# Patient Record
Sex: Male | Born: 1985 | Race: White | Hispanic: No | Smoking: Current every day smoker
Health system: Southern US, Community
[De-identification: ages and names within clinical notes are randomized; demographics above are authoritative.]

## PROBLEM LIST (undated history)

## (undated) DIAGNOSIS — R569 Unspecified convulsions: Secondary | ICD-10-CM

## (undated) DIAGNOSIS — F17200 Nicotine dependence, unspecified, uncomplicated: Secondary | ICD-10-CM

## (undated) DIAGNOSIS — F319 Bipolar disorder, unspecified: Secondary | ICD-10-CM

## (undated) DIAGNOSIS — F419 Anxiety disorder, unspecified: Secondary | ICD-10-CM

## (undated) DIAGNOSIS — J45909 Unspecified asthma, uncomplicated: Secondary | ICD-10-CM

## (undated) HISTORY — DX: Unspecified convulsions: R56.9

## (undated) HISTORY — PX: WISDOM TOOTH EXTRACTION: SHX21

## (undated) HISTORY — DX: Bipolar disorder, unspecified: F31.9

## (undated) HISTORY — DX: Nicotine dependence, unspecified, uncomplicated: F17.200

## (undated) HISTORY — DX: Anxiety disorder, unspecified: F41.9

---

## 2004-07-11 ENCOUNTER — Other Ambulatory Visit: Payer: Self-pay

## 2004-07-23 ENCOUNTER — Other Ambulatory Visit: Payer: Self-pay

## 2004-12-22 ENCOUNTER — Ambulatory Visit: Payer: Self-pay | Admitting: Neurology

## 2007-08-22 ENCOUNTER — Emergency Department: Payer: Self-pay | Admitting: Emergency Medicine

## 2010-02-18 ENCOUNTER — Emergency Department: Payer: Self-pay | Admitting: Emergency Medicine

## 2010-02-28 ENCOUNTER — Emergency Department: Payer: Self-pay | Admitting: Emergency Medicine

## 2011-02-08 ENCOUNTER — Ambulatory Visit (INDEPENDENT_AMBULATORY_CARE_PROVIDER_SITE_OTHER): Payer: Self-pay | Admitting: Family Medicine

## 2011-02-08 ENCOUNTER — Encounter: Payer: Self-pay | Admitting: Family Medicine

## 2011-02-08 DIAGNOSIS — F1010 Alcohol abuse, uncomplicated: Secondary | ICD-10-CM | POA: Insufficient documentation

## 2011-02-08 DIAGNOSIS — J019 Acute sinusitis, unspecified: Secondary | ICD-10-CM

## 2011-02-08 DIAGNOSIS — J029 Acute pharyngitis, unspecified: Secondary | ICD-10-CM

## 2011-02-08 DIAGNOSIS — J309 Allergic rhinitis, unspecified: Secondary | ICD-10-CM

## 2011-02-08 DIAGNOSIS — Z720 Tobacco use: Secondary | ICD-10-CM

## 2011-02-08 DIAGNOSIS — Z8659 Personal history of other mental and behavioral disorders: Secondary | ICD-10-CM | POA: Insufficient documentation

## 2011-02-08 DIAGNOSIS — F17200 Nicotine dependence, unspecified, uncomplicated: Secondary | ICD-10-CM

## 2011-02-08 DIAGNOSIS — F1011 Alcohol abuse, in remission: Secondary | ICD-10-CM | POA: Insufficient documentation

## 2011-02-08 HISTORY — DX: Nicotine dependence, unspecified, uncomplicated: F17.200

## 2011-02-15 NOTE — Assessment & Plan Note (Signed)
Summary: SORE THROAT/CONGESTION/EVM   Vital Signs:  Patient Profile:   25 Years Old Male CC:      sore throat Height:     77.5 inches Weight:      230 pounds O2 Sat:      98 % O2 treatment:    Room Air Temp:     98.4 degrees F oral Pulse rate:   61 / minute Pulse rhythm:   regular Resp:     12 per minute BP sitting:   130 / 78  (right arm)  Pt. in pain?   yes    Location:   throat                   Current Allergies (reviewed today): No known allergies History of Present Illness History from: patient Chief Complaint: sore throat History of Present Illness: The patient presented today for evaluation and treatment of a severe sore throat and nasal congestion and postnasal drainage for 3 weeks.  He is having maxillary facial pain and temporal/frontal headache pain.  He is having some runny nose and yellow thick sputum and nasal drainage and poor taste in the mouth.  He is a longtime smoker and reports no current fever or chills but has had them earlier in the course of his 3 week illness.  He is smoking 1 ppd and has been a smoker for 8 years.    REVIEW OF SYSTEMS Constitutional Symptoms       Complains of fever.     Denies chills, night sweats, weight loss, weight gain, and fatigue.  Eyes       Denies change in vision, eye pain, eye discharge, glasses, contact lenses, and eye surgery. Ear/Nose/Throat/Mouth       Complains of frequent runny nose, sinus problems, and sore throat.      Denies hearing loss/aids, change in hearing, ear pain, ear discharge, dizziness, frequent nose bleeds, hoarseness, and tooth pain or bleeding.  Respiratory       Complains of dry cough, productive cough, and shortness of breath.      Denies wheezing, asthma, bronchitis, and emphysema/COPD.  Cardiovascular       Denies murmurs, chest pain, and tires easily with exhertion.    Gastrointestinal       Denies stomach pain, nausea/vomiting, diarrhea, constipation, blood in bowel movements, and  indigestion. Genitourniary       Denies painful urination, kidney stones, and loss of urinary control. Neurological       Complains of headaches.      Denies paralysis, seizures, and fainting/blackouts. Musculoskeletal       Denies muscle pain, joint pain, joint stiffness, decreased range of motion, redness, swelling, muscle weakness, and gout.  Skin       Denies bruising, unusual mles/lumps or sores, and hair/skin or nail changes.  Psych       Denies mood changes, temper/anger issues, anxiety/stress, speech problems, depression, and sleep problems.  Past History:  Past Medical History: Schizophrenia  Past Surgical History: Denies surgical history  Family History: Healthy per patient  Social History: Patient smokes 1 ppd cigs x 8+ years;  Drinks 24 alcoholic drinks per week; denies recreational drug useage.  Physical Exam General appearance: well developed, well nourished, no acute distress Head: normocephalic, atraumatic Eyes: conjunctivae and lids normal Pupils: equal, round, reactive to light Ears: normal, no lesions or deformities Nasal: thick yellowish drainage, red, thick nasal turbinates bilateral Oral/Pharynx: pharyngeal erythema without exudate, uvula midline without deviation, bilateral beefy  red swollen tonsils Neck: neck supple,  trachea midline, no masses Chest/Lungs: no rales, wheezes, or rhonchi bilateral, breath sounds equal without effort Heart: regular rate and  rhythm, no murmur Abdomen: soft, non-tender without obvious organomegaly Extremities: normal extremities Neurological: grossly intact and non-focal Skin: no obvious rashes or lesions MSE: oriented to time, place, and person Assessment New Problems: SCHIZOPHRENIA, HX OF (ICD-V11.0) ALCOHOL USE (ICD-305.00) CIGARETTE SMOKER (ICD-305.1) ALLERGIC RHINITIS CAUSE UNSPECIFIED (ICD-477.9) ACUTE SINUSITIS, UNSPECIFIED (ICD-461.9)   Patient Education: Patient and/or caregiver instructed in the  following: rest, fluids, Tylenol prn, Ibuprofen prn. The risks, benefits and possible side effects were clearly explained and discussed with the patient.  The patient verbalized clear understanding.  The patient was given instructions to return if symptoms don't improve, worsen or new changes develop.  If it is not during clinic hours and the patient cannot get back to this clinic then the patient was told to seek medical care at an available urgent care or emergency department.  The patient verbalized understanding.   Demonstrates willingness to comply.  Plan New Medications/Changes: IBUPROFEN 800 MG TABS (IBUPROFEN) take 1 by mouth every 8 hours with food as needed severe sore throat  #15 x 0, 02/08/2011, Clanford Johnson MD CETIRIZINE HCL 10 MG TABS (CETIRIZINE HCL) take 1 by mouth daily for allergies  #30 x 0, 02/08/2011, Clanford Johnson MD FLUTICASONE PROPIONATE 50 MCG/ACT SUSP (FLUTICASONE PROPIONATE) 2 spray per nostril once daily  #1 x 1, 02/08/2011, Clanford Johnson MD AMOXICILLIN 500 MG CAPS (AMOXICILLIN) take 2 by mouth every 8 hours x 10 days  #60 x 0, 02/08/2011, Clanford Johnson MD  Follow Up: Follow up in 2-3 days if no improvement, Follow up on an as needed basis, Follow up with Primary Physician  The patient and/or caregiver has been counseled thoroughly with regard to medications prescribed including dosage, schedule, interactions, rationale for use, and possible side effects and they verbalize understanding.  Diagnoses and expected course of recovery discussed and will return if not improved as expected or if the condition worsens. Patient and/or caregiver verbalized understanding.  Prescriptions: IBUPROFEN 800 MG TABS (IBUPROFEN) take 1 by mouth every 8 hours with food as needed severe sore throat  #15 x 0   Entered and Authorized by:   Standley Dakins MD   Signed by:   Standley Dakins MD on 02/08/2011   Method used:   Handwritten   RxID:   [id number] CETIRIZINE HCL  10 MG TABS (CETIRIZINE HCL) take 1 by mouth daily for allergies  #30 x 0   Entered and Authorized by:   Standley Dakins MD   Signed by:   Standley Dakins MD on 02/08/2011   Method used:   Handwritten   RxID:   [id number] FLUTICASONE PROPIONATE 50 MCG/ACT SUSP (FLUTICASONE PROPIONATE) 2 spray per nostril once daily  #1 x 1   Entered and Authorized by:   Standley Dakins MD   Signed by:   Standley Dakins MD on 02/08/2011   Method used:   Handwritten   RxID:   [id number] AMOXICILLIN 500 MG CAPS (AMOXICILLIN) take 2 by mouth every 8 hours x 10 days  #60 x 0   Entered and Authorized by:   Standley Dakins MD   Signed by:   Standley Dakins MD on 02/08/2011   Method used:   Handwritten   RxID:   [id number]   Patient Instructions: 1)  Go to the pharmacy and pick up your prescription (s).  It may take up to 30 mins  for electronic prescriptions to be delivered to the pharmacy.  Please call if your pharmacy has not received your prescriptions after 30 minutes.   2)  Return or go to the ER if no improvement or symptoms getting worse.   3)  Take your antibiotic as prescribed until ALL of it is gone, but stop if you develop a rash or swelling and contact our office as soon as possible. 4)  Acute sinusitis symptoms for less than 10 days are not helped by antibiotics.Use warm moist compresses, and over the counter decongestants ( only as directed). Call if no improvement in 5-7 days, sooner if increasing pain, fever, or new symptoms. 5)  The patient was counseled and advised to stop using all tobacco products.  Medical assistance was offered and the patient was encouraged to call 1-800-QUIT-NOW to get a smoking cessation coach.    6)  Tobacco is very bad for your health and your loved ones! You Should stop smoking!. 7)  Stop Smoking Tips: Choose a Quit date. Cut down before the Quit date. decide what you will do as a substitute when you feel the urge to  smoke(gum,toothpick,exercise). 8)  The patient was informed that there is no on-call provider or services available at this clinic during off-hours (when the clinic is closed).  If the patient developed a problem or concern that required immediate attention, the patient was advised to go the the nearest available urgent care or emergency department for medical care.  The patient verbalized understanding.

## 2011-10-04 ENCOUNTER — Inpatient Hospital Stay: Payer: Self-pay | Admitting: Psychiatry

## 2011-11-19 ENCOUNTER — Inpatient Hospital Stay: Payer: Self-pay | Admitting: Psychiatry

## 2011-11-20 DIAGNOSIS — R9431 Abnormal electrocardiogram [ECG] [EKG]: Secondary | ICD-10-CM

## 2012-03-04 ENCOUNTER — Emergency Department: Payer: Self-pay | Admitting: Emergency Medicine

## 2014-09-14 DIAGNOSIS — F17219 Nicotine dependence, cigarettes, with unspecified nicotine-induced disorders: Secondary | ICD-10-CM | POA: Insufficient documentation

## 2015-08-09 DIAGNOSIS — E559 Vitamin D deficiency, unspecified: Secondary | ICD-10-CM | POA: Insufficient documentation

## 2015-08-09 DIAGNOSIS — J45902 Unspecified asthma with status asthmaticus: Secondary | ICD-10-CM | POA: Insufficient documentation

## 2017-04-23 ENCOUNTER — Ambulatory Visit: Payer: Self-pay | Admitting: Nurse Practitioner

## 2017-05-09 ENCOUNTER — Ambulatory Visit: Payer: Self-pay | Admitting: Family Medicine

## 2017-10-18 DIAGNOSIS — Z79899 Other long term (current) drug therapy: Secondary | ICD-10-CM | POA: Diagnosis not present

## 2018-02-04 ENCOUNTER — Encounter: Payer: Self-pay | Admitting: Family Medicine

## 2018-02-04 ENCOUNTER — Ambulatory Visit (INDEPENDENT_AMBULATORY_CARE_PROVIDER_SITE_OTHER): Payer: Medicare Other | Admitting: Family Medicine

## 2018-02-04 VITALS — BP 121/75 | HR 61 | Temp 98.3°F | Resp 16 | Ht 77.5 in | Wt 207.2 lb

## 2018-02-04 DIAGNOSIS — F200 Paranoid schizophrenia: Secondary | ICD-10-CM

## 2018-02-04 DIAGNOSIS — Z7689 Persons encountering health services in other specified circumstances: Secondary | ICD-10-CM | POA: Diagnosis not present

## 2018-02-04 DIAGNOSIS — R21 Rash and other nonspecific skin eruption: Secondary | ICD-10-CM | POA: Diagnosis not present

## 2018-02-04 NOTE — Patient Instructions (Addendum)
Thank you for coming to the office today.  1. May try OTC cortisone cream for anti itch and topical steroid to calm down this area as needed - usually recommend twice daily for 1-2 weeks.  If develop rash such as shingles - blistering, red raised, splotchy rash in this distinct area, then notify office, can send picture via mychart, and would recommend Valtrex anti viral medicine.   DUE for FASTING BLOOD WORK (no food or drink after midnight before the lab appointment, only water or coffee without cream/sugar on the morning of)  SCHEDULE "Lab Only" visit in the morning at the clinic for lab draw in 6 WEEKS   - Make sure Lab Only appointment is at about 1 week before your next appointment, so that results will be available  For Lab Results, once available within 2-3 days of blood draw, you can can log in to MyChart online to view your results and a brief explanation. Also, we can discuss results at next follow-up visit.   Please schedule a Follow-up Appointment to: Return in about 6 weeks (around 03/18/2018) for Annual Physical.    If you have any other questions or concerns, please feel free to call the office or send a message through MyChart. You may also schedule an earlier appointment if necessary.  Additionally, you may be receiving a survey about your experience at our office within a few days to 1 week by e-mail or mail. We value your feedback.  Saralyn PilarAlexander Asanti Craigo, DO [facility] [place] [facility]

## 2018-02-04 NOTE — Progress Notes (Signed)
Subjective:    Patient ID: Rodney FloorSean P Fehr, male    DOB: 1986-02-25, 32 y.o.   MRN: [id number]  Rodney [name] is a 32 y.o. male presenting on 02/04/2018 for Establish Care (rash)  [place]. [facility]. [facility] 1-2 years.  HPI   History of Mania vs Paranoid Schizophrenia in Remission [facility], Dr Rollene RotundaKaren [facility], no longer followed by Psychology/therapist, has followed q 3 month visits, and q 6 month labs Reviews back history, He reports symptoms started when he went off to college, in past he said that he experimented with some drugs, and he said that he was given a "mixed" diagnosis, thought drug induced psychosis and had extreme paranoia, lost touch with reality, he was institutionalized a few times, now >10 years since episodes. - Currently on various meds, rx per Psych cause some sedation at night - Depakote ER 500 mg 24 hours check lab every 6 months - Risperidone 2 mg  - Gabapentin 600mg  using for more calming and anxiety, taking twice daily AM and lunch  History of  Non typed Hepatitis illness - back in 4th grade, possible related to food borne or possible hepatitis vaccine 6-12 month before.  Left Abdominal Rash Recent onset 1-2 weeks ago switched from pain to itch - now 2-3 days rash He has a skin spot sore on Left abdomen - he had Psychiatry do some blood tests at that time, checked liver enzymes. Seems to scratch this area more and has a rash. - Admits itching - Admits occasional sharp pains, now mostly resolved  STD Screening Requesting STD screening with labs HIV, RPR, and GC/Chlamydia testing Has girlfriend, with HPV recently checked    [facility]  Decreased Interest 0  Down, Depressed, Hopeless 0  PHQ - 2 Score 0    History reviewed. No pertinent past medical history. History reviewed. No pertinent surgical history. Social History    Socioeconomic History  . Marital status: Single    Spouse name: Not on file  . Number of children: Not on file  . Years of education: Not on file  . Highest education level: Not on file  Social Needs  . Financial resource strain: Not on file  . Food insecurity - worry: Not on file  . Food insecurity - inability: Not on file  . Transportation needs - medical: Not on file  . Transportation needs - non-medical: Not on file  Occupational History  . Not on file  Tobacco Use  . Smoking status: Current Every Day Smoker    Packs/day: 1.00    Types: Cigarettes  . Smokeless tobacco: Current User  Substance and Sexual Activity  . Alcohol use: Yes  . Drug use: Yes    Types: Marijuana    Comment: in past  . Sexual activity: Not on file  Other Topics Concern  . Not on file  Social History Narrative  . Not on file   History reviewed. No pertinent family history. Current Outpatient Medications on File Prior to Visit  Medication Sig  . albuterol (PROAIR HFA) 108 (90 Base) MCG/ACT inhaler Inhale into the lungs.  . benztropine (COGENTIN) 0.5 MG tablet Take 0.5 mg by mouth.  . divalproex (DEPAKOTE ER) 500 MG 24 hr tablet Take 1,000 mg by mouth.  . gabapentin (NEURONTIN) 600 MG tablet Take 600 mg by mouth.  . risperiDONE (RISPERDAL) 2 MG tablet Take  2 mg by mouth.   No current facility-administered medications on file prior to visit.     Review of Systems  Constitutional: Negative for activity change, appetite change, chills, diaphoresis, fatigue and fever.  HENT: Negative for congestion and hearing loss.   Eyes: Negative for visual disturbance.  Respiratory: Negative for cough, chest tightness, shortness of breath and wheezing.   Cardiovascular: Negative for chest pain, palpitations and leg swelling.  Gastrointestinal: Negative for abdominal pain, constipation, diarrhea, nausea and vomiting.  Endocrine: Negative for cold intolerance and polyuria.  Genitourinary: Negative for  decreased urine volume, dysuria, frequency, hematuria, testicular pain and urgency.  Musculoskeletal: Negative for arthralgias and neck pain.  Skin: Positive for rash.  Neurological: Negative for dizziness, weakness, light-headedness, numbness and headaches.  Hematological: Negative for adenopathy.  Psychiatric/Behavioral: Negative for behavioral problems, dysphoric mood and sleep disturbance. The patient is not nervous/anxious.    Per HPI unless specifically indicated above     Objective:    BP 121/75   Pulse 61   Temp 98.3 F (36.8 C) (Oral)   Resp 16   Ht 6' 5.5" (1.969 m)   Wt 207 lb 3.2 oz (94 kg)   BMI 24.25 kg/m   Wt Readings from Last 3 Encounters:  02/04/18 207 lb 3.2 oz (94 kg)  02/08/11 (!) 230 lb (104.3 kg)    Physical Exam  Constitutional: He is oriented to person, place, and time. He appears well-developed and well-nourished. No distress.  Well-appearing, comfortable, cooperative  HENT:  Head: Normocephalic and atraumatic.  Mouth/Throat: Oropharynx is clear and moist.  Eyes: Conjunctivae are normal. Right eye exhibits no discharge. Left eye exhibits no discharge.  Neck: Normal range of motion. Neck supple. No thyromegaly present.  Cardiovascular: Normal rate, regular rhythm, normal heart sounds and intact distal pulses.  No murmur heard. Pulmonary/Chest: Effort normal and breath sounds normal. No respiratory distress. He has no wheezes. He has no rales.  Musculoskeletal: Normal range of motion. He exhibits no edema.  Lymphadenopathy:    He has no cervical adenopathy.  Neurological: He is alert and oriented to person, place, and time.  Skin: Skin is warm and dry. Rash (very minimal punctate erythematous spots mostly resolved Left lateral abdomen, no flaky dry skin, non tender) noted. He is not diaphoretic. No erythema.  Psychiatric: He has a normal mood and affect. His behavior is normal.  Well groomed, good eye contact, normal speech and thoughts. Very good  insight into his health.  Nursing note and vitals reviewed.  No results found for this or any previous visit.    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    Stable, chronic problem, now >10 years in remission See prior background with question of onset related to drugs/substances Followed by [facility] On current med regimen Discussion today advised I do not plan to rx his psych meds at this time, will review past history and course, and consider some in future if needed Will follow-up with labs from Psych       Other Visit Diagnoses    Encounter to establish care with new doctor   Request records from Psychiatry and will review [facility] outside records      Rash and nonspecific skin eruption     Seems resolving, non specific cause. History not quite consistent with Shingles eruption May try OTC hydrocortisone PRN      No orders of the defined types  were placed in this encounter.     Follow up plan: Return in about 6 weeks (around 03/18/2018) for Annual Physical.   Will order future labs, and STD screening, patient given urine cup for first void GC/Chlamydia to bring to clinic for sample.  Saralyn Pilar, DO [facility] 02/05/2018, 1:56 AM

## 2018-02-05 ENCOUNTER — Other Ambulatory Visit: Payer: Self-pay | Admitting: Family Medicine

## 2018-02-05 ENCOUNTER — Encounter: Payer: Self-pay | Admitting: Family Medicine

## 2018-02-05 DIAGNOSIS — F200 Paranoid schizophrenia: Secondary | ICD-10-CM | POA: Insufficient documentation

## 2018-02-05 DIAGNOSIS — Z114 Encounter for screening for human immunodeficiency virus [HIV]: Secondary | ICD-10-CM

## 2018-02-05 DIAGNOSIS — Z79899 Other long term (current) drug therapy: Secondary | ICD-10-CM

## 2018-02-05 DIAGNOSIS — Z0000 Encounter for general adult medical examination without abnormal findings: Secondary | ICD-10-CM

## 2018-02-05 DIAGNOSIS — Z113 Encounter for screening for infections with a predominantly sexual mode of transmission: Secondary | ICD-10-CM

## 2018-02-05 NOTE — Assessment & Plan Note (Signed)
Stable, chronic problem, now >10 years in remission See prior background with question of onset related to drugs/substances [facility] On current med regimen Discussion today advised I do not plan to rx his psych meds at this time, will review past history and course, and consider some in future if needed Will follow-up with labs from Psych

## 2018-03-08 ENCOUNTER — Other Ambulatory Visit: Payer: Self-pay

## 2018-03-08 DIAGNOSIS — Z79899 Other long term (current) drug therapy: Secondary | ICD-10-CM

## 2018-03-08 DIAGNOSIS — Z113 Encounter for screening for infections with a predominantly sexual mode of transmission: Secondary | ICD-10-CM

## 2018-03-08 DIAGNOSIS — Z0000 Encounter for general adult medical examination without abnormal findings: Secondary | ICD-10-CM

## 2018-03-08 DIAGNOSIS — Z114 Encounter for screening for human immunodeficiency virus [HIV]: Secondary | ICD-10-CM

## 2018-03-11 ENCOUNTER — Ambulatory Visit (INDEPENDENT_AMBULATORY_CARE_PROVIDER_SITE_OTHER): Payer: Medicare Other | Admitting: Family Medicine

## 2018-03-11 ENCOUNTER — Encounter: Payer: Self-pay | Admitting: Family Medicine

## 2018-03-11 ENCOUNTER — Other Ambulatory Visit: Payer: Self-pay | Admitting: Family Medicine

## 2018-03-11 ENCOUNTER — Other Ambulatory Visit: Payer: Medicare Other

## 2018-03-11 VITALS — BP 115/75 | HR 69 | Temp 98.1°F | Resp 16 | Ht 77.5 in | Wt 209.0 lb

## 2018-03-11 DIAGNOSIS — Z0000 Encounter for general adult medical examination without abnormal findings: Secondary | ICD-10-CM

## 2018-03-11 DIAGNOSIS — F200 Paranoid schizophrenia: Secondary | ICD-10-CM | POA: Diagnosis not present

## 2018-03-11 DIAGNOSIS — Z79899 Other long term (current) drug therapy: Secondary | ICD-10-CM | POA: Diagnosis not present

## 2018-03-11 DIAGNOSIS — Z720 Tobacco use: Secondary | ICD-10-CM | POA: Diagnosis not present

## 2018-03-11 NOTE — Patient Instructions (Addendum)
Thank you for coming to the office today.  1.  Keep the good work overall  Continue working on improving regular exercise and diet as discussed, agree with goal to reduce fast food and soda intake and increase water.  For breathing / cough - If interested in quitting smoking and need assistance, let me know and we can have you return to office to discuss  QUITLINE phone number 1 800-QUIT NOW  Also you may have an early form of COPD - affecting your breathing, cough and productive mucus - May consider pulmonary function testing at a Lung Specialist for a diagnosis in future - Or we can start a DAILY inhaler to help control your symptoms, such as Spiriva, Flovent or Breo, Dulera, Advair  DUE for FASTING BLOOD WORK (no food or drink after midnight before the lab appointment, only water or coffee without cream/sugar on the morning of)  SCHEDULE "Lab Only" visit in the morning at the clinic for lab draw in 1 YEAR - SAME DAY AS PHYSICAL  For Lab Results, once available within 2-3 days of blood draw, you can can log in to MyChart online to view your results and a brief explanation. Also, we can discuss results at next follow-up visit.   Please schedule a Follow-up Appointment to: Return in about 1 year (around 03/12/2019) for Annual Physical.  If you have any other questions or concerns, please feel free to call the office or send a message through MyChart. You may also schedule an earlier appointment if necessary.  Additionally, you may be receiving a survey about your experience at our office within a few days to 1 week by e-mail or mail. We value your feedback.  Saralyn PilarAlexander Karamalegos, DO [facility] [place]

## 2018-03-11 NOTE — Assessment & Plan Note (Signed)
Stable, chronic problem, now >10 years in remission [facility] Cheree DittoGraham, last seen 12/2017 On current med regimen per Psych

## 2018-03-11 NOTE — Assessment & Plan Note (Signed)
Active smoker, 1ppd x 15 years No prior successful quit attempt Not quite ready to quit, but has some recent worse symptoms with cough Handout provided, quitline resource given, counseling provided  Discussion today >5 min specifically on counseling on risks of tobacco use, complications, treatment, smoking cessation.

## 2018-03-11 NOTE — Progress Notes (Signed)
Subjective:    Patient ID: Rodney [name], male    DOB: 1986-02-12, 32 y.o.   MRN: [id number]  Rodney [name] is a 32 y.o. male presenting on 03/11/2018 for Annual Exam   HPI   Here for Annual Physical and fasting labs drawn today.  Annual Physical / Lifestyle - No significant updates since last visit when he established care with Rodney [name] in February 2019 - He does admit lifestyle is lacking since he has less exercise with new job, is less physical than prior job - Diet is balanced, but he does admit sometimes limited by time and convenience, eats fast food and soda, he will try to reduce these  History of Mania vs Paranoid Schizophrenia in Remission Followed by [facility]iatry, Dr Rollene Rotunda, no longer followed by Psychology/therapist, has followed q 3 month visits, and q 6 month labs Reviews back history, He reports symptoms started when he went off to college, in past he said that he experimented with some drugs, and he said that he was given a "mixed" diagnosis, thought drug induced psychosis and had extreme paranoia, lost touch with reality, he was institutionalized a few times, now >10 years since episodes. - Currently on various meds, rx per Psych cause some sedation at night - Depakote ER 500 mg 24 hours check lab every 6 months - Risperidone 2 mg  - Gabapentin 600mg  using for more calming and anxiety, taking twice daily AM and lunch - Today he reports no changes and doing well on current regimen - No new concerns - See ROS below and documentation in CareEverywhere from Psychiatry  STD Screening Requested STD screening with labs HIV, RPR, and GC/Chlamydia testing, has provided 1st void of day urine sample from home for [facility] / Chlamydia, pending result now Has girlfriend, with HPV recently checked - He is asymptomatic and so is girlfriend, but requesting labs for screening  Tobacco Abuse Active smoker, >15 years 1ppd. He does admit some occasional "smoker's cough" and some wheezing  or cough, but never treated for it. No prior dx COPD or breathing treatments or inhalers. He is interested in quitting but not ready.  Health Maintenance: Declined TDap, he thinks has not been >10 years since last vaccine - deferred to next visit  No flowsheet data found.   Depression screen PHQ 2/9 02/04/2018  Decreased Interest 0  Down, Depressed, Hopeless 0  PHQ - 2 Score 0    Past Medical History:  Diagnosis Date  . CIGARETTE SMOKER 02/08/2011   Qualifier: Diagnosis of  By: Laural Benes MD, [place]     History reviewed. No pertinent surgical history. Social History   Socioeconomic History  . Marital status: Single    Spouse name: Not on file  . Number of children: Not on file  . Years of education: Not on file  . Highest education level: Not on file  Social Needs  . Financial resource strain: Not on file  . Food insecurity - worry: Not on file  . Food insecurity - inability: Not on file  . Transportation needs - medical: Not on file  . Transportation needs - non-medical: Not on file  Occupational History  . Occupation: Museum/gallery exhibitions officer    Comment: Rodney [name]  Tobacco Use  . Smoking status: Current Every Day Smoker    Packs/day: 1.00    Years: 15.00    Pack years: 15.00    Types: Cigarettes  . Smokeless tobacco: Current User  Substance and Sexual Activity  . Alcohol use:  No    Frequency: Never    Comment: 6 drinks a day  . Drug use: No    Comment: in past  . Sexual activity: Not on file  Other Topics Concern  . Not on file  Social History Narrative  . Not on file   History reviewed. No pertinent family history. Current Outpatient Medications on File Prior to Visit  Medication Sig  . albuterol (PROAIR HFA) 108 (90 Base) MCG/ACT inhaler Inhale into the lungs.  . benztropine (COGENTIN) 0.5 MG tablet Take 0.5 mg by mouth.  . divalproex (DEPAKOTE ER) 500 MG 24 hr tablet Take 1,000 mg by mouth.  . gabapentin (NEURONTIN) 600 MG tablet Take 600 mg by mouth.  .  risperiDONE (RISPERDAL) 2 MG tablet Take 2 mg by mouth.   No current facility-administered medications on file prior to visit.     Review of Systems  Constitutional: Negative for activity change, appetite change, chills, diaphoresis, fatigue and fever.  HENT: Negative for congestion and hearing loss.   Eyes: Negative for visual disturbance.  Respiratory: Positive for cough. Negative for apnea, choking, chest tightness, shortness of breath and wheezing.   Cardiovascular: Negative for chest pain, palpitations and leg swelling.  Gastrointestinal: Negative for abdominal pain, anal bleeding, blood in stool, constipation, diarrhea, nausea and vomiting.  Endocrine: Negative for cold intolerance and polyuria.  Genitourinary: Negative for decreased urine volume, difficulty urinating, dysuria, frequency, hematuria, testicular pain and urgency.  Musculoskeletal: Negative for arthralgias, back pain and neck pain.  Skin: Negative for rash.  Allergic/Immunologic: Negative for environmental allergies.  Neurological: Negative for dizziness, weakness, light-headedness, numbness and headaches.  Hematological: Negative for adenopathy.  Psychiatric/Behavioral: Negative for agitation, behavioral problems, decreased concentration, dysphoric mood and sleep disturbance. The patient is not nervous/anxious.    Per HPI unless specifically indicated above     Objective:    BP 115/75   Pulse 69   Temp 98.1 F (36.7 C) (Oral)   Resp 16   Ht 6' 5.5" (1.969 m)   Wt 209 lb (94.8 kg)   BMI 24.47 kg/m   Wt Readings from Last 3 Encounters:  03/11/18 209 lb (94.8 kg)  02/04/18 207 lb 3.2 oz (94 kg)  02/08/11 (!) 230 lb (104.3 kg)    Physical Exam  Constitutional: He is oriented to person, place, and time. He appears well-developed and well-nourished. No distress.  Well-appearing, comfortable, cooperative  HENT:  Head: Normocephalic and atraumatic.  Mouth/Throat: Oropharynx is clear and moist.  Eyes:  Conjunctivae and EOM are normal. Pupils are equal, round, and reactive to light. Right eye exhibits no discharge. Left eye exhibits no discharge.  Neck: Normal range of motion. Neck supple. No thyromegaly present.  Cardiovascular: Normal rate, regular rhythm, normal heart sounds and intact distal pulses.  No murmur heard. Pulmonary/Chest: Effort normal. No respiratory distress. He has no rales.  Mild reduced air movement diffusely, some scattered mild wheezes at times but overall still good air movement and speaks full sentences. Minimal coughing. No focal crackles, some mild rhonchi clear with cough.  Abdominal: Soft. Bowel sounds are normal. He exhibits no distension and no mass. There is no tenderness.  Musculoskeletal: Normal range of motion. He exhibits no edema or tenderness.  Upper / Lower Extremities: - Normal muscle tone, strength bilateral upper extremities 5/5, lower extremities 5/5  Lymphadenopathy:    He has no cervical adenopathy.  Neurological: He is alert and oriented to person, place, and time.  Distal sensation intact to light touch all  extremities  Skin: Skin is warm and dry. No rash noted. He is not diaphoretic. No erythema.  Psychiatric: He has a normal mood and affect. His behavior is normal.  Well groomed, good eye contact, normal speech and thoughts  Nursing note and vitals reviewed.  No results found for this or any previous visit.    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        Other Visit Diagnoses    Annual physical exam    -  Primary Updated Health Maintenance Declined TDap and Flu Check routine labs including screening STDs with routine HIV Counseling on smoking cessation Will review basic labs fasting today Encourage improve lifestyle with goal to reduce fast food and soda intake      No orders of the defined types were placed in this encounter.   Follow up plan: Return in about 1 year (around 03/12/2019) for Annual Physical.  Future labs 1 year to be ordered after review of current labs from today.  Saralyn Pilar, DO [facility] 03/11/2018, 5:42 PM

## 2018-03-12 LAB — C. TRACHOMATIS/N. GONORRHOEAE RNA
C. trachomatis RNA, TMA: NOT DETECTED
N. GONORRHOEAE RNA, TMA: NOT DETECTED

## 2018-03-12 LAB — HEMOGLOBIN A1C
EAG (MMOL/L): 5.7 (calc)
Hgb A1c MFr Bld: 5.2 % of total Hgb (ref <5.7)
MEAN PLASMA GLUCOSE: 103 (calc)

## 2018-03-12 LAB — COMPLETE METABOLIC PANEL WITH GFR
AG Ratio: 1.6 (calc) (ref 1.0–2.5)
ALKALINE PHOSPHATASE (APISO): 52 U/L (ref 40–115)
ALT: 10 U/L (ref 9–46)
AST: 13 U/L (ref 10–40)
Albumin: 4.7 g/dL (ref 3.6–5.1)
BUN/Creatinine Ratio: 6 (calc) (ref 6–22)
BUN: 6 mg/dL — AB (ref 7–25)
CALCIUM: 9.9 mg/dL (ref 8.6–10.3)
CO2: 31 mmol/L (ref 20–32)
Chloride: 101 mmol/L (ref 98–110)
Creat: 1.02 mg/dL (ref 0.60–1.35)
GFR, EST NON AFRICAN AMERICAN: 96 mL/min/{1.73_m2} (ref >=60)
GFR, Est African American: 111 mL/min/{1.73_m2} (ref >=60)
GLUCOSE: 86 mg/dL (ref 65–99)
Globulin: 2.9 g/dL (calc) (ref 1.9–3.7)
Potassium: 4.3 mmol/L (ref 3.5–5.3)
SODIUM: 140 mmol/L (ref 135–146)
Total Bilirubin: 0.2 mg/dL (ref 0.2–1.2)
Total Protein: 7.6 g/dL (ref 6.1–8.1)

## 2018-03-12 LAB — RPR: RPR: NONREACTIVE

## 2018-03-12 LAB — HIV ANTIBODY (ROUTINE TESTING W REFLEX): HIV 1&2 Ab, 4th Generation: NONREACTIVE

## 2018-04-09 ENCOUNTER — Other Ambulatory Visit: Payer: Self-pay

## 2018-04-09 ENCOUNTER — Ambulatory Visit (INDEPENDENT_AMBULATORY_CARE_PROVIDER_SITE_OTHER): Payer: Medicare Other | Admitting: Family Medicine

## 2018-04-09 ENCOUNTER — Encounter: Payer: Self-pay | Admitting: Family Medicine

## 2018-04-09 VITALS — BP 109/63 | HR 75 | Temp 98.1°F | Resp 16 | Ht 77.0 in | Wt 202.0 lb

## 2018-04-09 DIAGNOSIS — R1013 Epigastric pain: Secondary | ICD-10-CM

## 2018-04-09 DIAGNOSIS — R112 Nausea with vomiting, unspecified: Secondary | ICD-10-CM | POA: Diagnosis not present

## 2018-04-09 MED ORDER — OMEPRAZOLE 40 MG PO CPDR: 40.0000 mg | DELAYED_RELEASE_CAPSULE | Freq: Every day | ORAL | 0 refills | Status: DC

## 2018-04-09 MED ORDER — SUCRALFATE 1 G PO TABS: 1.0000 g | ORAL_TABLET | Freq: Three times a day (TID) | ORAL | 0 refills | Status: DC

## 2018-04-09 MED ORDER — ONDANSETRON 4 MG PO TBDP: 4.0000 mg | ORAL_TABLET | Freq: Three times a day (TID) | ORAL | 0 refills | Status: DC | PRN

## 2018-04-09 NOTE — Patient Instructions (Addendum)
Thank you for coming to the office today.  I am not sure the exact cause of your abdominal pain, however I am concerned that one significant possibility could be underlying acid reflux or gastritis, it is possible but less likely to have a gastric ulcer.  I do not think this is your gallbladder or appendix today. This is reassuring.  Starting tomorrow before breakfast or tonight before dinner take Omeprazole 40mg  daily. Prefer to take this med about 30 min before breakfast or 1st meal of day for 2-4 weeks  Take other prescribed medicine Carafate (Sucralfate) as needed up to 4 times daily (3 meals and bedtime)  to coat stomach lining to ease symptoms, if it helps reduce symptoms then it is more likely to be due to acid and/or ulcer.  DIET RECOMMENDATIONS - Avoid spicy, greasy, fried foods, also things like caffeine, dark chocolate, peppermint can worsen - Avoid large meals and late night snacks, also do not go more than 4-5 hours without a snack or meal (not eating will worsen reflux symptoms due to stomach acid) - You may also elevate the head of your bed at night to sleep at very slight incline to help reduce symptoms  If the problem improves but keeps coming back, we can discuss higher dose or longer course at next visit.  If symptoms are worsening or persistent despite treatment or develop any different severe esophagus or abdominal pain, unable to swallow solids or liquids, nausea, vomiting especially blood in vomit, fever/chills, or unintentional weight loss / no appetite, please follow-up sooner in office or seek more immediate medical attention at hospital Emergency Department.  Regarding other medicines  Zofran for nausea  - STOP taking Ibuprofen, Advil, Motrin, Goody's / BC powder - DO NOT take without discussing with your doctor. These medicines can put you at high risk for future bleeding.  If need pain medicine, may take Tylenol Extra Strength (Acetaminophen) 500mg  tabs -  take 1 to 2 tabs per dose (max 1000mg ) every 6-8 hours for pain (take regularly, don't skip a dose for next 7 days), max 24 hour daily dose is 6 tablets or 3000mg . In the future you can repeat the same everyday Tylenol course for 1-2 weeks at a time.  If significant worsening despite medicine, worse pain, nausea vomiting fever or other concern, call office or go straight to hospital ED may need imaging test  Please schedule a Follow-up Appointment to: Return in about 1 month (around 05/07/2018), or if symptoms worsen or fail to improve, for abdominal pain.  If you have any other questions or concerns, please feel free to call the office or send a message through MyChart. You may also schedule an earlier appointment if necessary.  Additionally, you may be receiving a survey about your experience at our office within a few days to 1 week by e-mail or mail. We value your feedback.  Saralyn PilarAlexander Karamalegos, DO [facility] [place]

## 2018-04-09 NOTE — Progress Notes (Signed)
Subjective:    Patient ID: Rodney [name], male    DOB: 1986-11-08, 32 y.o.   MRN: [id number]  Rodney [name] is a 32 y.o. male presenting on 04/09/2018 for Abdominal Pain (accompanied by nausea for 3 days)  Patient presents for a same day appointment.  HPI   ABDOMINAL PAIN with Nausea - Reports symptoms started acutely 2 nights ago with abdominal discomfort and pain, mostly moderate to severe 7 to 8 out of 10 at worse stabbing pain in upper abdomen epigastric area, lasting minutes only then will resolve and be nearly pain free until next episode, seems to be several episodes a day, and associated nausea and vomiting, then yesterday some improvement overall, still had some abdominal pain and later in evening he had improved nausea without any vomiting since yesterday. Initially symptoms were located more upper mid abdomen, then seemed to gradually change to mid abdomen as well. - He is able to eat now without vomiting or nausea. Eating does not seem to affect his abdominal pain, not improve or not worsening. - No known other sick contacts - No prior similar episodes of abdominal pain in past. He has had episode of nausea in past seemed to be more food borne. - No history of GERD or heartburn in past - Not taken any OTC medicine for indigestion - Denies any fevers chills sweats body aches, diarrhea, dark stool or blood in stool  History reviewed. No pertinent surgical history.  Depression screen [facility] 2/9 04/09/2018 02/04/2018  Decreased Interest 0 0  Down, Depressed, Hopeless 0 0  PHQ - 2 Score 0 0  Altered sleeping 0 -  Tired, decreased energy 0 -  Change in appetite 0 -  Feeling bad or failure about yourself  0 -  Trouble concentrating 0 -  Moving slowly or fidgety/restless 0 -  Suicidal thoughts 0 -  PHQ-9 Score 0 -  Difficult doing work/chores Not difficult at all -    Social History   Tobacco Use  . Smoking status: Current Every Day Smoker    Packs/day: 1.00    Years: 15.00   Pack years: 15.00    Types: Cigarettes  . Smokeless tobacco: Current User  Substance Use Topics  . Alcohol use: No    Frequency: Never    Comment: 6 drinks a day  . Drug use: No    Types: Marijuana, Other-see comments    Comment: in past    Review of Systems Per HPI unless specifically indicated above     Objective:    BP 109/63 (BP Location: Left Arm, Patient Position: Sitting, Cuff Size: Normal)   Pulse 75   Temp 98.1 F (36.7 C)   Resp 16   Ht 6\' 5"  (1.956 m)   Wt 202 lb (91.6 kg)   SpO2 100%   BMI 23.95 kg/m   Wt Readings from Last 3 Encounters:  04/09/18 202 lb (91.6 kg)  03/11/18 209 lb (94.8 kg)  02/04/18 207 lb 3.2 oz (94 kg)    Physical Exam  Constitutional: He is oriented to person, place, and time. He appears well-developed and well-nourished. No distress.  Well-appearing, comfortable, cooperative  HENT:  Head: Normocephalic and atraumatic.  Mouth/Throat: Oropharynx is clear and moist.  Eyes: Conjunctivae are normal. Right eye exhibits no discharge. Left eye exhibits no discharge.  Neck: Normal range of motion. Neck supple. No thyromegaly present.  Cardiovascular: Normal rate, regular rhythm, normal heart sounds and intact distal pulses.  No murmur heard. Pulmonary/Chest:  Effort normal and breath sounds normal. No respiratory distress. He has no wheezes. He has no rales.  Abdominal: Soft. Normal appearance and bowel sounds are normal. He exhibits no distension. There is no hepatosplenomegaly. There is tenderness in the epigastric area, periumbilical area and suprapubic area. There is no rigidity, no rebound, no guarding, no CVA tenderness, no tenderness at McBurney's point and negative Murphy's sign.  Musculoskeletal: Normal range of motion. He exhibits no edema.  Lymphadenopathy:    He has no cervical adenopathy.  Neurological: He is alert and oriented to person, place, and time.  Skin: Skin is warm and dry. No rash noted. He is not diaphoretic. No  erythema.  Psychiatric: He has a normal mood and affect. His behavior is normal.  Well groomed, good eye contact, normal speech and thoughts  Nursing note and vitals reviewed.      Results for orders placed or performed in visit on 03/08/18  C. trachomatis/N. gonorrhoeae RNA  Result Value Ref Range   C. trachomatis RNA, TMA NOT DETECTED NOT DETECT   N. gonorrhoeae RNA, TMA NOT DETECTED NOT DETECT  Hemoglobin A1c  Result Value Ref Range   Hgb A1c MFr Bld 5.2 <5.7 % of total Hgb   Mean Plasma Glucose 103 (calc)   eAG (mmol/L) 5.7 (calc)  RPR  Result Value Ref Range   RPR Ser Ql NON-REACTIVE NON-REACTI  HIV antibody  Result Value Ref Range   HIV 1&2 Ab, 4th Generation NON-REACTIVE NON-REACTI  COMPLETE METABOLIC PANEL WITH GFR  Result Value Ref Range   Glucose, Bld 86 65 - 99 mg/dL   BUN 6 (L) 7 - 25 mg/dL   Creat 1.61 0.96 - 0.45 mg/dL   GFR, Est Non African American 96 > OR = 60 mL/min/1.54m2   GFR, Est African American 111 > OR = 60 mL/min/1.71m2   BUN/Creatinine Ratio 6 6 - 22 (calc)   Sodium 140 135 - 146 mmol/L   Potassium 4.3 3.5 - 5.3 mmol/L   Chloride 101 98 - 110 mmol/L   CO2 31 20 - 32 mmol/L   Calcium 9.9 8.6 - 10.3 mg/dL   Total Protein 7.6 6.1 - 8.1 g/dL   Albumin 4.7 3.6 - 5.1 g/dL   Globulin 2.9 1.9 - 3.7 g/dL (calc)   AG Ratio 1.6 1.0 - 2.5 (calc)   Total Bilirubin 0.2 0.2 - 1.2 mg/dL   Alkaline phosphatase (APISO) 52 40 - 115 U/L   AST 13 10 - 40 U/L   ALT 10 9 - 46 U/L      Assessment & Plan:   Problem List Items Addressed This Visit    None    Visit Diagnoses    Epigastric abdominal pain    -  Primary   Relevant Medications   omeprazole (PRILOSEC) 40 MG capsule   sucralfate (CARAFATE) 1 g tablet   Non-intractable vomiting with nausea, unspecified vomiting type       Relevant Medications   ondansetron (ZOFRAN ODT) 4 MG disintegrating tablet      Suspect episodic abdominal pain d/t uncertain etiology either foodborne or possibly viral  gastro, otherwise his symptoms are persistent and seem to be consistent with some GERD vs gastritis given location and some description of symptoms, cannot rule this out. - Not taking NSAIDs, not taken any PPI or OTC meds antacid - No other significant GI red flags (no unintentional wt loss, night-sweats, refractory abdominal pain n/v, hematemesis or melena). - Benign abdominal exam except local tender -  Not consistent with gallbladder/cholelithiasis or infection or appendicitis based on exam and history  Plan: - Discussion on possible causes, encourage it will likely be self limited - Offered medicines to cover for possible gastritis if not improving - May start PPI 40mg  daily omeprazole rx for 2-4 week trial - Add Carafate PRN for symptom relief - Rx Zofran ODT PRN - Lifestyle / diet recommendations per AVS - Strict return criteria given for worsening symptoms   Meds ordered this encounter  Medications  . omeprazole (PRILOSEC) 40 MG capsule    Sig: Take 1 capsule (40 mg total) by mouth daily. For 2 weeks then reduce dose to as needed    Dispense:  30 capsule    Refill:  0  . sucralfate (CARAFATE) 1 g tablet    Sig: Take 1 tablet (1 g total) by mouth 4 (four) times daily -  with meals and at bedtime.    Dispense:  40 tablet    Refill:  0  . ondansetron (ZOFRAN ODT) 4 MG disintegrating tablet    Sig: Take 1 tablet (4 mg total) by mouth every 8 (eight) hours as needed for nausea or vomiting.    Dispense:  30 tablet    Refill:  0     Follow up plan: Return in about 1 month (around 05/07/2018), or if symptoms worsen or fail to improve, for abdominal pain.  Saralyn PilarAlexander Demarko Zeimet, DO [facility] 04/09/2018, 3:28 PM

## 2018-04-10 ENCOUNTER — Encounter: Payer: Self-pay | Admitting: Family Medicine

## 2018-05-06 DIAGNOSIS — M542 Cervicalgia: Secondary | ICD-10-CM | POA: Diagnosis not present

## 2018-06-17 DIAGNOSIS — H04123 Dry eye syndrome of bilateral lacrimal glands: Secondary | ICD-10-CM | POA: Diagnosis not present

## 2018-08-28 ENCOUNTER — Ambulatory Visit: Payer: Medicare Other | Admitting: Family Medicine

## 2018-08-28 ENCOUNTER — Encounter: Payer: Self-pay | Admitting: Family Medicine

## 2018-08-28 VITALS — BP 127/83 | HR 64 | Temp 98.0°F | Resp 16 | Ht 77.0 in | Wt 204.8 lb

## 2018-08-28 DIAGNOSIS — W540XXA Bitten by dog, initial encounter: Secondary | ICD-10-CM

## 2018-08-28 DIAGNOSIS — T148XXA Other injury of unspecified body region, initial encounter: Secondary | ICD-10-CM | POA: Diagnosis not present

## 2018-08-28 DIAGNOSIS — S61259A Open bite of unspecified finger without damage to nail, initial encounter: Secondary | ICD-10-CM | POA: Diagnosis not present

## 2018-08-28 MED ORDER — AMOXICILLIN-POT CLAVULANATE 875-125 MG PO TABS: 1.0000 | ORAL_TABLET | Freq: Two times a day (BID) | ORAL | 0 refills | Status: DC

## 2018-08-28 NOTE — Patient Instructions (Addendum)
Thank you for coming to the office today.  I think the nerve injury from the bite will heal gradually, it may take up to several weeks, it looks like the dog got you in exactly the right area to affect the nerve on either side of the fingernail.  The good news is that it does not look infected. You have taken good care of it.  Printed rx antibiotic only if you notice worsening symptoms of redness, swelling, fever chills drainage of pus.  Please schedule a Follow-up Appointment to: Return if symptoms worsen or fail to improve, for dog bite.  If you have any other questions or concerns, please feel free to call the office or send a message through MyChart. You may also schedule an earlier appointment if necessary.  Additionally, you may be receiving a survey about your experience at our office within a few days to 1 week by e-mail or mail. We value your feedback.  Saralyn Pilar, DO [facility], [place]

## 2018-08-28 NOTE — Progress Notes (Signed)
Subjective:    Patient ID: Rodney [name], male    DOB: Apr 30, 1986, 32 y.o.   MRN: [id number]  Rodney [name] is a 32 y.o. male presenting on 08/28/2018 for Animal Bite   HPI   Left Finger Dog Bite, initial visit / Left Finger Numbness Reported new injury onset 4 days ago, Saturday night he was walking his dog and encountered neighbor dog, collie, and the dogs needed to be separated and he intervened and the other dog accidentally bit his left finger, direct injury to one spot by fingernail, otherwise did not have other injuries. He did not have significant bleeding. He cleaned out wound well with washing and soap, peroxide, and used topical antibiotic ointment. He has done well but has complained of persistent numbness along his inner side of the left 5th finger only after this injury. Denies fever chills, redness drainage of pus or bleeding, other numbness tingling   Depression screen [facility] 2/9 08/28/2018 04/09/2018 02/04/2018  Decreased Interest 0 0 0  Down, Depressed, Hopeless 0 0 0  PHQ - 2 Score 0 0 0  Altered sleeping - 0 -  Tired, decreased energy - 0 -  Change in appetite - 0 -  Feeling bad or failure about yourself  - 0 -  Trouble concentrating - 0 -  Moving slowly or fidgety/restless - 0 -  Suicidal thoughts - 0 -  PHQ-9 Score - 0 -  Difficult doing work/chores - Not difficult at all -    Social History   Tobacco Use  . Smoking status: Current Every Day Smoker    Packs/day: 1.00    Years: 15.00    Pack years: 15.00    Types: Cigarettes  . Smokeless tobacco: Current User  Substance Use Topics  . Alcohol use: No    Frequency: Never    Comment: 6 drinks a day  . Drug use: No    Types: Marijuana, Other-see comments    Comment: in past    Review of Systems Per HPI unless specifically indicated above     Objective:    BP 127/83   Pulse 64   Temp 98 F (36.7 C)   Resp 16   Ht 6\' 5"  (1.956 m)   Wt 204 lb 12.8 oz (92.9 kg)   BMI 24.29 kg/m   Wt Readings from Last  3 Encounters:  08/28/18 204 lb 12.8 oz (92.9 kg)  04/09/18 202 lb (91.6 kg)  03/11/18 209 lb (94.8 kg)    Physical Exam  Constitutional: He is oriented to person, place, and time. He appears well-developed and well-nourished. No distress.  Well-appearing, comfortable, cooperative  Cardiovascular: Normal rate.  Pulmonary/Chest: Effort normal.  Musculoskeletal: He exhibits no edema.  Neurological: He is alert and oriented to person, place, and time.  Skin: Skin is warm and dry. No rash noted. He is not diaphoretic. No erythema.  Left fifth finger with superficial abrasion to medial cuticle region. No erythema, no edema, non tender.  Reduced sensation to light touch medial aspect on side of finger only.  Psychiatric: He has a normal mood and affect. His behavior is normal.  Well groomed, good eye contact, normal speech and thoughts  Nursing note and vitals reviewed.    Left 5th finger       Assessment & Plan:   Problem List Items Addressed This Visit    None    Visit Diagnoses    Dog bite of finger, initial encounter    -  Primary   Relevant Medications   amoxicillin-clavulanate (AUGMENTIN) 875-125 MG tablet   Peripheral nerve contusion          Clinically with superficial puncture wound to Left 5th finger lateral nailbed without secondary infection Now 4 days after bite, with good anti-septic techniques to prevent infection Residual symptom with localized left finger numbness after injury, suspect due to position of puncture at superficial nerve.  Plan Reassurance Continue home conservative care for preventing infection, washing, keep clean, may use antibiotic ointment Offered printed rx for Augmentin antibiotic BID 7 days ONLY if worsening criteria of redness, swelling, drainage of pus fever or signs of secondary infection - again this is rare, but opted to give him printed rx as back up plan if needed  Regarding nerve symptoms, suspect superficial nerve contusion vs  laceration may have caused this damage, anticipate it may recover sensation over next few weeks. Follow-up or notify office if not improving or worsening.  Meds ordered this encounter  Medications  . amoxicillin-clavulanate (AUGMENTIN) 875-125 MG tablet    Sig: Take 1 tablet by mouth 2 (two) times daily.    Dispense:  14 tablet    Refill:  0      Follow up plan: Return if symptoms worsen or fail to improve, for dog bite.  Saralyn Pilar, DO [facility] 08/28/2018, 6:00 PM

## 2018-09-11 ENCOUNTER — Encounter: Payer: Self-pay | Admitting: Licensed Clinical Social Worker

## 2018-09-11 ENCOUNTER — Ambulatory Visit (INDEPENDENT_AMBULATORY_CARE_PROVIDER_SITE_OTHER): Payer: Medicare Other | Admitting: Licensed Clinical Social Worker

## 2018-09-11 DIAGNOSIS — F200 Paranoid schizophrenia: Secondary | ICD-10-CM

## 2018-09-11 NOTE — Progress Notes (Signed)
Comprehensive Clinical Assessment (CCA) Note  09/11/2018 Rodney [name] [id number]  Visit Diagnosis:      ICD-10-CM   1. Paranoid schizophrenia (HCC) F20.0       CCA Part One  Part One has been completed on paper by the patient.  (See scanned document in Chart Review)  CCA Part Two A  Intake/Chief Complaint:  CCA Intake With Chief Complaint CCA Part Two Date: 09/11/18 CCA Part Two Time: 1530 Chief Complaint/Presenting Problem: "Really, I've just had previous experiences and problems that have required me to stay with therapy. All my symptoms are controlled by the medications I've been taking for the last ten years."  Patients Currently Reported Symptoms/Problems: "Anxiety for sure."  Collateral Involvement: None reported Individual's Strengths: "I'm outgoing sometimes. I'm considerate of others." Individual's Preferences: N/A Individual's Abilities: Good communication  Type of Services Patient Feels Are Needed: medication management, pt reports not wanting therapy.  Initial Clinical Notes/Concerns: Pt reports not wanting therapy. Just needing medication management.   Mental Health Symptoms Depression:  Depression: Sleep (too much or little), Change in energy/activity  Mania:  Mania: N/A  Anxiety:   Anxiety: Worrying, Sleep  Psychosis:  Psychosis: N/A  Trauma:  Trauma: N/A  Obsessions:  Obsessions: N/A  Compulsions:  Compulsions: N/A  Inattention:  Inattention: N/A  Hyperactivity/Impulsivity:  Hyperactivity/Impulsivity: N/A  Oppositional/Defiant Behaviors:  Oppositional/Defiant Behaviors: N/A  Borderline Personality:  Emotional Irregularity: N/A  Other Mood/Personality Symptoms:  Other Mood/Personality Symtpoms: Pt reports being stable on medications for the last ten years--with no psychotic symptoms.    Mental Status Exam Appearance and self-care  Stature:  Stature: Tall  Weight:  Weight: Thin  Clothing:  Clothing: Casual  Grooming:  Grooming: Normal  Cosmetic use:   Cosmetic Use: None  Posture/gait:  Posture/Gait: Normal  Motor activity:  Motor Activity: Not Remarkable  Sensorium  Attention:  Attention: Normal  Concentration:  Concentration: Normal  Orientation:  Orientation: X5  Recall/memory:     Affect and Mood  Affect:  Affect: Blunted  Mood:  Mood: Euthymic  Relating  Eye contact:  Eye Contact: Normal  Facial expression:  Facial Expression: Responsive  Attitude toward examiner:  Attitude Toward Examiner: Cooperative  Thought and Language  Speech flow: Speech Flow: Normal  Thought content:  Thought Content: Appropriate to mood and circumstances  Preoccupation:  Preoccupations: (None reported)  Hallucinations:  Hallucinations: (Denies)  Organization:     Company secretary of Knowledge:  Fund of Knowledge: Average  Intelligence:  Intelligence: Average  Abstraction:  Abstraction: Normal  Judgement:  Judgement: Normal  Reality Testing:  Reality Testing: Realistic  Insight:  Insight: Good  Decision Making:  Decision Making: Normal  Social Functioning  Social Maturity:  Social Maturity: Isolates  Social Judgement:  Social Judgement: Normal  Stress  Stressors:  Stressors: Work, Engineer, maintenance (IT):  Coping Ability: Normal  Skill Deficits:     Supports:      Family and Psychosocial History: Family history Marital status: Long term relationship Long term relationship, how long?: 4 year relationship  What types of issues is patient dealing with in the relationship?: None reported.  Additional relationship information: None reported.  Are you sexually active?: Yes What is your sexual orientation?: Heterosexual  Has your sexual activity been affected by drugs, alcohol, medication, or emotional stress?: N/A Does patient have children?: No  Childhood History:  Childhood History By whom was/is the patient raised?: Both parents Additional childhood history information: "Really good for the most part."  Description  of patient's  relationship with caregiver when they were a child: "Good."  Patient's description of current relationship with people who raised him/her: "Still good."  How were you disciplined when you got in trouble as a child/adolescent?: "Spankings. Switches."  Does patient have siblings?: Yes Number of Siblings: 1 Description of patient's current relationship with siblings: one brother, "I don't really talk to him much anymore. 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?: (N/A) Are There Guns or Other Weapons in Your Home?: No Are These Comptroller?: (N/A)  Education: Education School Currently Attending: N/A Last Grade Completed: 12 Name of High School: [employer] Did Garment/textile technologist From [employer]?: Yes Did Theme park manager?: Yes What Type of College Degree Do you Have?: [employer] degree Did You Attend Graduate School?: No What Was Your Major?: N/A Did You Have Any Special Interests In School?: n/A Did You Have An Individualized Education Program (IIEP): No Did You Have Any Difficulty At School?: No  Religion: Religion/Spirituality Are You A  Religious Person?: Yes What is Your Religious Affiliation?: Other How Might This Affect Treatment?: Christian, N/A  Leisure/Recreation: Leisure / Recreation Leisure and Hobbies: "Sports. I used to play them until I got older. I watch them on TV now and like to shoot pool."   Exercise/Diet: Exercise/Diet Do You Exercise?: No Have You Gained or Lost A Significant Amount of Weight in the Past Six Months?: No Do You Follow a Special Diet?: No Do You Have Any Trouble Sleeping?: Yes Explanation of Sleeping Difficulties: "I'm not having to wake up early anymore for work."   CCA Part Two C  Alcohol/Drug Use: Alcohol / Drug Use Pain Medications: N/A Prescriptions: Gabapentin, Risperdone, Drucolproex, Benztropine Over the Counter: N/A History of alcohol / drug use?: Yes Longest period of sobriety (when/how long): 30 Negative Consequences of Use: Legal(2 DUIs) Withdrawal Symptoms: Other (Comment)(sleeping problems) Substance #1 Name of Substance 1: Alcohol  1 - Age of First Use: 14 1 - Amount (size/oz): 6-12 pack  1 - Frequency: daily  1 - Duration: 17 years  1 - Last Use / Amount: yesterday, 7 beers                     CCA Part Three  ASAM's:  Six Dimensions of Multidimensional Assessment  Dimension 1:  Acute Intoxication and/or Withdrawal Potential:     Dimension 2:  Biomedical Conditions and Complications:     Dimension 3:  Emotional, Behavioral, or Cognitive Conditions and Complications:     Dimension 4:  Readiness to Change:     Dimension 5:  Relapse, Continued use, or Continued Problem Potential:     Dimension 6:  Recovery/Living Environment:  Substance use Disorder (SUD) Substance Use Disorder (SUD)  Checklist Symptoms of Substance Use: Continued use despite having a persistent/recurrent physical/psychological problem caused/exacerbated by use, Large amounts of time spent to obtain, use or recover from the substance(s), Evidence of tolerance, Continued use  despite persistent or recurrent social, interpersonal problems, caused or exacerbated by use, Presence of craving or strong urge to use  Social Function:  Social Functioning Social Maturity: Isolates Social Judgement: Normal  Stress:  Stress Stressors: Work, Dance movement psychotherapistMoney Coping Ability: Normal Patient Takes Medications The Way The Doctor Instructed?: Yes Priority Risk: Moderate Risk  Risk Assessment- Self-Harm Potential: Risk Assessment For Self-Harm Potential Thoughts of Self-Harm: No current thoughts Method: No plan Availability of Means: No access/NA Additional Comments for Self-Harm Potential: N/A  Risk Assessment -Dangerous to Others Potential: Risk Assessment For Dangerous to Others Potential Method: No Plan Availability of Means: No access or NA Intent: Vague intent or NA Notification Required: No need or identified person Additional Comments for Danger to Others Potential: Hx of psychosis   DSM5 Diagnoses: Patient Active Problem List   Diagnosis Date Noted  . Paranoid schizophrenia in remission (HCC) 02/05/2018  . ALCOHOL USE 02/08/2011  . Tobacco abuse 02/08/2011  . ALLERGIC RHINITIS CAUSE UNSPECIFIED 02/08/2011  . SCHIZOPHRENIA, HX OF 02/08/2011    Patient Centered Plan: Patient is on the following Treatment Plan(s):  Anxiety  Recommendations for Services/Supports/Treatments: Recommendations for Services/Supports/Treatments Recommendations For Services/Supports/Treatments: Medication Management  Treatment Plan Summary: Pt reported not wanting therapy services and states he only wants medication management moving forward.     Referrals to Alternative Service(s): Referred to Alternative Service(s):   Place:   Date:   Time:    Referred to Alternative Service(s):   Place:   Date:   Time:    Referred to Alternative Service(s):   Place:   Date:   Time:    Referred to Alternative Service(s):   Place:   Date:   Time:     Heidi DachKelsey [place]

## 2018-09-16 ENCOUNTER — Ambulatory Visit: Payer: Medicare Other | Admitting: Psychiatry

## 2018-09-23 ENCOUNTER — Encounter: Payer: Self-pay | Admitting: Psychiatry

## 2018-09-23 ENCOUNTER — Ambulatory Visit (INDEPENDENT_AMBULATORY_CARE_PROVIDER_SITE_OTHER): Payer: Medicare Other | Admitting: Psychiatry

## 2018-09-23 VITALS — BP 121/80 | HR 65 | Temp 98.0°F | Ht 77.5 in | Wt 204.8 lb

## 2018-09-23 DIAGNOSIS — F411 Generalized anxiety disorder: Secondary | ICD-10-CM | POA: Diagnosis not present

## 2018-09-23 DIAGNOSIS — F1020 Alcohol dependence, uncomplicated: Secondary | ICD-10-CM

## 2018-09-23 DIAGNOSIS — Z634 Disappearance and death of family member: Secondary | ICD-10-CM | POA: Diagnosis not present

## 2018-09-23 DIAGNOSIS — F17200 Nicotine dependence, unspecified, uncomplicated: Secondary | ICD-10-CM

## 2018-09-23 DIAGNOSIS — F200 Paranoid schizophrenia: Secondary | ICD-10-CM | POA: Diagnosis not present

## 2018-09-23 DIAGNOSIS — F1211 Cannabis abuse, in remission: Secondary | ICD-10-CM

## 2018-09-23 MED ORDER — GABAPENTIN 600 MG PO TABS: 600.0000 mg | ORAL_TABLET | Freq: Three times a day (TID) | ORAL | 1 refills | Status: DC

## 2018-09-23 MED ORDER — BUSPIRONE HCL 10 MG PO TABS: 5.0000 mg | ORAL_TABLET | Freq: Two times a day (BID) | ORAL | 1 refills | Status: DC

## 2018-09-23 NOTE — Progress Notes (Signed)
Psychiatric Initial Adult Assessment   Patient Identification: Rodney [name] MRN:  [id number] Date of Evaluation:  09/23/2018 Referral Source: Dr. Saralyn Pilar Chief Complaint:  ' I am here to establish care.' Chief Complaint    Establish Care     Visit Diagnosis:    ICD-10-CM   1. Paranoid schizophrenia (HCC) F20.0    in remission   2. GAD (generalized anxiety disorder) F41.1 gabapentin (NEURONTIN) 600 MG tablet    busPIRone (BUSPAR) 10 MG tablet  3. Bereavement Z63.4   4. Alcohol use disorder, moderate, dependence (HCC) F10.20 gabapentin (NEURONTIN) 600 MG tablet  5. Tobacco use disorder F17.200   6. 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  Patient used to be under the care of [facility] out patient clinic in [place] for his mental health treatment for at least 10 years now.  Patient reports he could not drive all the way there anymore and hence decided to find someone closer.  Patient hence got transitioned to our clinic.  Patient reports his schizophrenia symptoms as under control now.  He denies any perceptual disturbances at this time.  He is compliant on his risperidone and Cogentin as prescribed.  He has been on that dose since the past 5-7 years or so.  Patient does report anxiety symptoms.  He reports he worries about his current situation a lot.  He describes several psychosocial stressors.  He reports he is currently working only part-time which causes him to worry about his financial problems.  He reports his girlfriend does work but that is not enough to sustain all of them.  He worries about finding the right job.  He worries about working at a place where he does not  have to have a lot of people contact since he does not think he has the right social skills to do that.  Patient also reports recent stressor of his pet dog who died.  He reports his pet was really old and had medical problems.  Patient became very tearful when he discussed this with Clinical research associate.  Patient does report sleep problems.  He reports his sleep wake cycle is disrupted.  He has tried several sleep aids in the past however was unable to name them with Clinical research associate.  He also reports alcoholism.  He reports he has been using alcohol since the past 7 years or so.  He currently drinks 6-7 beers 12 ounce each every night.  He reports since he has been worrying a lot about his situational stressors he has been drinking more.  He would like to cut down and has started working on it.  He reports he did go to a residential treatment facility at [facility] in the past for his substance abuse problems.  This may have been in 2011.  Patient denies any suicidality.  Patient denies any perceptual disturbances.  Patient denies any homicidality.  Patient denies any history of trauma.  Patient does report good relationship with his girlfriend and her 2 children who were 22 and 47 years old.  He also reports he has a good relationship with his parents.    Associated Signs/Symptoms: Depression Symptoms:  depressed mood, insomnia, anxiety, (Hypo) Manic Symptoms:  denies Anxiety Symptoms:  Excessive Worry, Psychotic Symptoms:  denies at this time, does have a hx of psychosis PTSD Symptoms: Negative  Past Psychiatric History: Used to be under the care of Dr. Melida Gimenez at [facility].  Patient reports a history of inpatient mental health admission in the past at [facility] [address] [facility], [facility], [place], [place].  Patient denies any history of suicide attempts.  Previous Psychotropic Medications: Yes Reports being tried on several medications in the past.  However he does not remember the names.  Substance Abuse History  in the last 12 months:  Yes.  Alcohol abuse since the past 7 years or so.  6-7 beers-12 ounce each night since the past several days.  Has been to treatment facility-at [facility]  in 2011 for substance abuse problems.  He also reports a history of cannabis use in the past however currently denies it.  Consequences of Substance Abuse: Negative  Past Medical History:  Past Medical History:  Diagnosis Date  . Anxiety   . Bipolar disorder (HCC)   . CIGARETTE SMOKER 02/08/2011   Qualifier: Diagnosis of  By: Laural Benes MD, [place]    . Seizures (HCC)     Past Surgical History:  Procedure Laterality Date  . WISDOM TOOTH EXTRACTION      Family Psychiatric History: Brother-OCD, mother-anxiety disorder, maternal aunt-anxiety disorder, maternal grandmother-anxiety disorder, paternal grandfather-anxiety disorder  Family History:  Family History  Problem Relation Age of Onset  . Anxiety disorder Mother   . OCD Brother   . Anxiety disorder Maternal Aunt   . Anxiety disorder Maternal Grandmother   . Anxiety disorder Paternal Grandfather     Social History:   Social History   Socioeconomic History  . Marital status: Significant Other    Spouse name: Not on file  . Number of children: 0  . Years of education: Not on file  . Highest education level: Associate degree: occupational, Scientist, product/process development, or vocational program  Occupational History  . Occupation: Museum/gallery exhibitions officer    Comment: Engineer, maintenance  Social Needs  . Financial resource strain: Very hard  . Food insecurity:    Worry: Often true    Inability: Often true  . Transportation needs:    Medical: No    Non-medical: No  Tobacco Use  . Smoking status: Current Every Day Smoker    Packs/day: 1.00    Years: 15.00    Pack years: 15.00    Types: Cigarettes  . Smokeless tobacco: Current User  Substance and Sexual Activity  . Alcohol use: No    Frequency: Never    Comment: 6 drinks a day  . Drug use: Yes    Types: Marijuana, Other-see  comments, LSD    Comment: in past  . Sexual activity: Yes  Lifestyle  . Physical activity:    Days per week: 0 days    Minutes per session: 0 min  . Stress: Rather much  Relationships  . Social connections:    Talks on phone: Once a week    Gets together: Never    Attends religious service: Never    Active member of club or organization: No    Attends meetings of clubs or organizations: Never    Relationship status: Living with partner  Other Topics Concern  . Not on file  Social History Narrative  . Not on file    Additional Social History: Patient currently lives in [place] with his girlfriend and her 2 children aged 63 and 89 years old.  He reports a good  relationship with them.  He is currently part-time employed with [employer] ([employer]). Reports he is trying to find a full-time job.  His parents and brother  lives in [place]-reports them as supportive.  He has an associate degree from [employer].  Allergies:   Allergies  Allergen Reactions  . Hydroxyzine Hcl Rash    Metabolic Disorder Labs: Lab Results  Component Value Date   HGBA1C 5.2 03/11/2018   MPG 103 03/11/2018   No results found for: PROLACTIN No results found for: CHOL, TRIG, HDL, CHOLHDL, VLDL, LDLCALC   Current Medications: Current Outpatient Medications  Medication Sig Dispense Refill  . benztropine (COGENTIN) 0.5 MG tablet Take 0.5 mg by mouth.    . divalproex (DEPAKOTE ER) 500 MG 24 hr tablet Take 1,000 mg by mouth.    . gabapentin (NEURONTIN) 600 MG tablet Take 1 tablet (600 mg total) by mouth 3 (three) times daily. 270 tablet 1  . risperiDONE (RISPERDAL) 2 MG tablet Take 2 mg by mouth.    . busPIRone (BUSPAR) 10 MG tablet Take 0.5 tablets (5 mg total) by mouth 2 (two) times daily. 30 tablet 1   No current facility-administered medications for this visit.     Neurologic: Headache: No Seizure: No Paresthesias:No  Musculoskeletal: Strength & Muscle Tone: within normal limits Gait & Station:  normal Patient leans: N/A  Psychiatric Specialty Exam: Review of Systems  Psychiatric/Behavioral: Positive for depression and substance abuse. The patient is nervous/anxious and has insomnia.   All other systems reviewed and are negative.   Blood pressure 121/80, pulse 65, temperature 98 F (36.7 C), temperature source Oral, height 6' 5.5" (1.969 m), weight 204 lb 12.8 oz (92.9 kg).Body mass index is 23.97 kg/m.  General Appearance: Casual  Eye Contact:  Fair  Speech:  Normal Rate  Volume:  Normal  Mood:  Anxious and Dysphoric  Affect:  Congruent  Thought Process:  Goal Directed and Descriptions of Associations: Intact  Orientation:  Full (Time, Place, and Person)  Thought Content:  Logical  Suicidal Thoughts:  No  Homicidal Thoughts:  No  Memory:  Immediate;   Fair Recent;   Fair Remote;   Fair  Judgement:  Fair  Insight:  Fair  Psychomotor Activity:  Normal  Concentration:  Concentration: Fair and Attention Span: Fair  Recall:  [employer] of Knowledge:Fair  Language: Fair  Akathisia:  No  Handed:  Right  AIMS (if indicated):  Denies tremors, rigidity,stiffness  Assets:  Communication Skills Desire for Improvement Housing Social Support  ADL's:  Intact  Cognition: WNL  Sleep:  poor    Treatment Plan Summary:Rodney [name] is a 32 year old Caucasian male, part-time employed, lives in [place], has a history of paranoid schizophrenia in remission, anxiety disorder, alcoholism, cannabis use in remission, presented to the clinic today to establish care.  Patient reports his schizophrenia as stable on the current medication regimen.  He however continues to have some anxiety symptoms more so due to his recent psychosocial stressors.  He also struggles with alcoholism.  Patient denies any suicidality or perceptual disturbances or homicidality at this time.  Patient is employed, has good social support system and is compliant on medications.  Patient will continue to pursue psychotherapy  with Adelina Mings our therapist and work on his substance abuse problems.  Will also make the following medication changes.  Plan as noted below. Medication management and Plan as noted below Plan Schizophrenia in remission Continue risperidone 2 mg p.o. nightly Continue Cogentin 0.5 mg as needed for EPS Continue  Depakote ER 1000 mg p.o. daily Reviewed the most recent Depakote level-noted as 07/20/2016 - 74 - therapeutic-patient however reports he had another level done more recently.  Will request records.   For GAD Continue gabapentin 600 mg p.o. 3 times daily Add BuSpar 5 mg p.o. twice daily Refer for psychotherapy. Patient will continue therapy sessions with therapist Adelina Mings.  For alcohol use disorder Provided substance abuse counseling Gabapentin will help. Continue psychotherapy  For insomnia Start melatonin 3-9 mg p.o. nightly as needed.  For tobacco use disorder Provided smoking cessation counseling.    Reviewed the following labs in [facility] R-AST-10/18/2017-15, ALT-13, alkaline phosphatase-59, globin A1c-5.2, lipid panel- total cholesterol-183, HDL-77, triglycerides-133, all within normal limits dated 10/18/2017.  Follow-up in clinic in 4 weeks or sooner if needed.  More than 50 % of the time was spent for psychoeducation and supportive psychotherapy and care coordination. This note was generated in part or whole with voice recognition software. Voice recognition is usually quite accurate but there are transcription errors that can and very often do occur. I apologize for any typographical errors that were not detected and corrected.     Jomarie Longs, MD 10/1/20198:50 AM

## 2018-09-23 NOTE — Patient Instructions (Signed)
Buspirone tablets What is this medicine? BUSPIRONE (byoo SPYE rone) is used to treat anxiety disorders. This medicine may be used for other purposes; ask your health care provider or pharmacist if you have questions. COMMON BRAND NAME(S): BuSpar What should I tell my health care provider before I take this medicine? They need to know if you have any of these conditions: -kidney or liver disease -an unusual or allergic reaction to buspirone, other medicines, foods, dyes, or preservatives -pregnant or trying to get pregnant -breast-feeding How should I use this medicine? Take this medicine by mouth with a glass of water. Follow the directions on the prescription label. You may take this medicine with or without food. To ensure that this medicine always works the same way for you, you should take it either always with or always without food. Take your doses at regular intervals. Do not take your medicine more often than directed. Do not stop taking except on the advice of your doctor or health care professional. Talk to your pediatrician regarding the use of this medicine in children. Special care may be needed. Overdosage: If you think you have taken too much of this medicine contact a poison control center or emergency room at once. NOTE: This medicine is only for you. Do not share this medicine with others. What if I miss a dose? If you miss a dose, take it as soon as you can. If it is almost time for your next dose, take only that dose. Do not take double or extra doses. What may interact with this medicine? Do not take this medicine with any of the following medications: -linezolid -MAOIs like Carbex, Eldepryl, Marplan, Nardil, and Parnate -methylene blue -procarbazine This medicine may also interact with the following medications: -diazepam -digoxin -diltiazem -erythromycin -grapefruit juice -haloperidol -medicines for mental depression or mood problems -medicines for seizures like  carbamazepine, phenobarbital and phenytoin -nefazodone -other medications for anxiety -rifampin -ritonavir -some antifungal medicines like itraconazole, ketoconazole, and voriconazole -verapamil -warfarin This list may not describe all possible interactions. Give your health care provider a list of all the medicines, herbs, non-prescription drugs, or dietary supplements you use. Also tell them if you smoke, drink alcohol, or use illegal drugs. Some items may interact with your medicine. What should I watch for while using this medicine? Visit your doctor or health care professional for regular checks on your progress. It may take 1 to 2 weeks before your anxiety gets better. You may get drowsy or dizzy. Do not drive, use machinery, or do anything that needs mental alertness until you know how this drug affects you. Do not stand or sit up quickly, especially if you are an older patient. This reduces the risk of dizzy or fainting spells. Alcohol can make you more drowsy and dizzy. Avoid alcoholic drinks. What side effects may I notice from receiving this medicine? Side effects that you should report to your doctor or health care professional as soon as possible: -blurred vision or other vision changes -chest pain -confusion -difficulty breathing -feelings of hostility or anger -muscle aches and pains -numbness or tingling in hands or feet -ringing in the ears -skin rash and itching -vomiting -weakness Side effects that usually do not require medical attention (report to your doctor or health care professional if they continue or are bothersome): -disturbed dreams, nightmares -headache -nausea -restlessness or nervousness -sore throat and nasal congestion -stomach upset This list may not describe all possible side effects. Call your doctor for medical advice about side   effects. You may report side effects to FDA at 1-800-FDA-1088. Where should I keep my medicine? Keep out of the reach  of children. Store at room temperature below 30 degrees C (86 degrees F). Protect from light. Keep container tightly closed. Throw away any unused medicine after the expiration date. NOTE: This sheet is a summary. It may not cover all possible information. If you have questions about this medicine, talk to your doctor, pharmacist, or health care provider.  2018 Elsevier/Gold Standard (2010-07-21 18:06:11)  

## 2018-09-24 ENCOUNTER — Encounter: Payer: Self-pay | Admitting: Psychiatry

## 2018-09-26 ENCOUNTER — Telehealth: Payer: Self-pay | Admitting: Family Medicine

## 2018-09-26 ENCOUNTER — Ambulatory Visit (INDEPENDENT_AMBULATORY_CARE_PROVIDER_SITE_OTHER): Payer: Medicare Other | Admitting: Licensed Clinical Social Worker

## 2018-09-26 ENCOUNTER — Encounter: Payer: Self-pay | Admitting: Licensed Clinical Social Worker

## 2018-09-26 DIAGNOSIS — J449 Chronic obstructive pulmonary disease, unspecified: Secondary | ICD-10-CM | POA: Insufficient documentation

## 2018-09-26 DIAGNOSIS — F200 Paranoid schizophrenia: Secondary | ICD-10-CM

## 2018-09-26 DIAGNOSIS — J45909 Unspecified asthma, uncomplicated: Secondary | ICD-10-CM | POA: Insufficient documentation

## 2018-09-26 DIAGNOSIS — J4520 Mild intermittent asthma, uncomplicated: Secondary | ICD-10-CM

## 2018-09-26 MED ORDER — ALBUTEROL SULFATE HFA 108 (90 BASE) MCG/ACT IN AERS: 2.0000 | INHALATION_SPRAY | RESPIRATORY_TRACT | 2 refills | Status: DC | PRN

## 2018-09-26 NOTE — Telephone Encounter (Signed)
The pt requesting a rescue inhaler for intermittent SOB that he associates with smoking. He states that he had an inhaler in the past prescribed by his Psychiatrist. The SOB been intermittent for several years and is not severe.

## 2018-09-26 NOTE — Telephone Encounter (Signed)
Pt said albuterol was discussed a previous appt.  He has been having trouble breathing and would like to have a prescription sent to [facility].  His call bac [phone]

## 2018-09-26 NOTE — Telephone Encounter (Signed)
Sent rx Albuterol for PRN use. Previously we have offered further evaluation of lung function - such as refer to pulm / PFTs / daily maintenance therapy if indicated. Follow-up pending usage of albuterol and symptoms  Saralyn Pilar, DO [facility] 09/26/2018, 9:33 PM

## 2018-09-26 NOTE — Progress Notes (Signed)
   THERAPIST PROGRESS NOTE  Session Time: 1230-115  Participation Level: Active  Behavioral Response: NeatAlertDepressed  Type of Therapy: Individual Therapy  Treatment Goals addressed: Coping  Interventions: Supportive  Summary: Rodney [name] is a 32 y.o. male who presents with mood symptoms and alcohol use problems. Odai previously reported he did not want therapy; however, he reported that shortly after our last visit, his dog died unexpectedly and he noticed his drinking use increased and his depression increased as well. Bobie spoke openly about his alcohol use, stating he was drinking 12 beers daily, then was able to reduce to 8, then to 4, and is now at 2 beers per night. Jarreau reports having a plan to continue cutting down on alcohol until he is no longer drinking. He stated if he is unable to do it on his own, he plans to go to detox at an inpatient facility. We discussed his mental health history, and his first psychotic break at age 65. We discussed how his paranoia affected him at that time, and how it affected his alcohol use. Stoney spoke about his motivation for drinking at various times in his life, and currently alcohol's primary purpose is stress relief. Amal and I discussed replacing drinking with other, more health coping mechanisms such as mindfulness techniques. Weslee was in agreement with trying these skills.   Suicidal/Homicidal: No  Therapist Response: Aniket reports he would like to do therapy monthly, and reported feeling like that would sustain his mental health progress. I told him if he felt he needed a session prior to a month--to call and schedule it. Kenwood spoke openly and honestly about his mental health history, and was tearful at times during our conversation. He was able to show insight into his alcohol use, as well as how it has affected his sleep and mental health.   Plan: Return again in 4 weeks.  Diagnosis: Axis I: Schizophrenia    Axis II: No  diagnosis    Heidi Dach, LCSW 09/26/2018

## 2018-09-27 NOTE — Telephone Encounter (Signed)
Patient was informed.

## 2018-10-02 ENCOUNTER — Ambulatory Visit: Payer: Medicare Other | Admitting: Psychiatry

## 2018-10-20 ENCOUNTER — Other Ambulatory Visit: Payer: Self-pay | Admitting: Psychiatry

## 2018-10-20 DIAGNOSIS — F411 Generalized anxiety disorder: Secondary | ICD-10-CM

## 2018-10-23 ENCOUNTER — Other Ambulatory Visit: Payer: Self-pay

## 2018-10-23 ENCOUNTER — Encounter: Payer: Self-pay | Admitting: Psychiatry

## 2018-10-23 ENCOUNTER — Encounter: Payer: Self-pay | Admitting: Licensed Clinical Social Worker

## 2018-10-23 ENCOUNTER — Ambulatory Visit (INDEPENDENT_AMBULATORY_CARE_PROVIDER_SITE_OTHER): Payer: Medicare Other | Admitting: Licensed Clinical Social Worker

## 2018-10-23 ENCOUNTER — Ambulatory Visit: Payer: Medicare Other | Admitting: Psychiatry

## 2018-10-23 VITALS — BP 133/85 | HR 71 | Temp 97.6°F | Wt 209.0 lb

## 2018-10-23 DIAGNOSIS — F200 Paranoid schizophrenia: Secondary | ICD-10-CM

## 2018-10-23 DIAGNOSIS — F411 Generalized anxiety disorder: Secondary | ICD-10-CM | POA: Diagnosis not present

## 2018-10-23 DIAGNOSIS — F1020 Alcohol dependence, uncomplicated: Secondary | ICD-10-CM

## 2018-10-23 DIAGNOSIS — F17200 Nicotine dependence, unspecified, uncomplicated: Secondary | ICD-10-CM

## 2018-10-23 DIAGNOSIS — Z634 Disappearance and death of family member: Secondary | ICD-10-CM

## 2018-10-23 MED ORDER — BUSPIRONE HCL 10 MG PO TABS: 10.0000 mg | ORAL_TABLET | Freq: Three times a day (TID) | ORAL | 0 refills | Status: DC

## 2018-10-23 MED ORDER — DIVALPROEX SODIUM ER 500 MG PO TB24: 1000.0000 mg | ORAL_TABLET | Freq: Every day | ORAL | 0 refills | Status: DC

## 2018-10-23 MED ORDER — BENZTROPINE MESYLATE 1 MG PO TABS: 1.0000 mg | ORAL_TABLET | Freq: Two times a day (BID) | ORAL | 1 refills | Status: DC

## 2018-10-23 MED ORDER — RISPERIDONE 2 MG PO TABS: 2.0000 mg | ORAL_TABLET | Freq: Every day | ORAL | 0 refills | Status: DC

## 2018-10-23 NOTE — Progress Notes (Signed)
   THERAPIST PROGRESS NOTE  Session Time: 130  Participation Level: Active  Behavioral Response: NeatLethargicflat  Type of Therapy: Individual Therapy  Treatment Goals addressed: Coping  Interventions: Motivational Interviewing  Summary: Rodney [name] is a 32 y.o. male who presents with symptoms related to his diagnosis. Rodney [name] reports he has been able to stop drinking as planned, and has not experienced significant withdrawal symptoms. He reports some irritability, but states the doctor has increased one of his medications she hopes will address that. Rodney [name] reports he has been able to cope more effectively with the loss of his dog, and is choosing to "focus on the good things." Rodney [name] reports utilizing two apps on his phone to assist with his anxiety, and is utilizing guided imagery as well. Rodney [name] reports getting a new job that he starts on November 11th, and reported feeling both anxious and excited about that opportunity.    Suicidal/Homicidal: No   Therapist Response: Rodney [name] was able to discuss his anxiety and irritability openly, and stated "otherwise he was feeling great." Rodney [name] reported he will continue monthly therapy sessions as needed, and would contact [facility] if he needed an appointment sooner.   Plan: Return again in 4 weeks.  Diagnosis: Axis I: Schiozphrenia (paranoid type)    Axis II: No diagnosis    Heidi Dach, LCSW 10/23/2018

## 2018-10-23 NOTE — Progress Notes (Signed)
BH MD OP Progress Note  10/23/2018 2:44 PM Rodney [name]  MRN:  [id number]  Chief Complaint:  Chief Complaint    Follow-up; Medication Refill     HPI: Rodney [name] is a 32 year old Caucasian male, employed, lives in [place], married, has a history of schizophrenia currently in remission, anxiety disorder, alcohol use disorder in early remission, cannabis use disorder in remission, presented to the clinic today for a follow-up visit.  Patient today reports he continues to struggle with some anxiety symptoms.  He has been working with his therapist Ms. Tasia Catchings which has been helpful.  He reports he is going to see her today.  He reports he has been compliant with the BuSpar.  He is interested in increasing the dosage today.  He reports sleep is improved on the melatonin.  He reports he continues to be compliant with his other medications like risperidone, Depakote.  He denies any perceptual disturbances.  He denies any anger issues or irritability.  He reports some stiffness in his upper extremities.  He reports it does not bother him much. He however on evaluation has rigidity of BL UE . He takes Cogentin which helps to some extent.  Discussed increasing the dosage today.  Discussed with him that if he continues to struggle with side effects of risperidone his dosage may need to be reduced.  He will monitor himself and Pharmacist, hospital know.  He reports he is going to start a part-time job with [employer] on November 11.  He looks forward to the same. Visit Diagnosis:    ICD-10-CM   1. Paranoid schizophrenia (HCC) F20.0 divalproex (DEPAKOTE ER) 500 MG 24 hr tablet    risperiDONE (RISPERDAL) 2 MG tablet    benztropine (COGENTIN) 1 MG tablet  2. GAD (generalized anxiety disorder) F41.1 busPIRone (BUSPAR) 10 MG tablet  3. Bereavement Z63.4   4. Alcohol use disorder, moderate, dependence (HCC) F10.20    in early remission  5. Tobacco use disorder F17.200     Past Psychiatric History: Reviewed past psychiatric  history from my progress note on 09/23/2018.  Past Medical History:  Past Medical History:  Diagnosis Date  . Anxiety   . Bipolar disorder (HCC)   . CIGARETTE SMOKER 02/08/2011   Qualifier: Diagnosis of  By: Laural Benes MD, [place]    . Seizures (HCC)     Past Surgical History:  Procedure Laterality Date  . WISDOM TOOTH EXTRACTION      Family Psychiatric History: Reviewed family psychiatric history from my progress note on 09/23/2018  Family History:  Family History  Problem Relation Age of Onset  . Anxiety disorder Mother   . OCD Brother   . Anxiety disorder Maternal Aunt   . Anxiety disorder Maternal Grandmother   . Anxiety disorder Paternal Grandfather     Social History: I have reviewed social history from my progress note on 09/23/2018. Social History   Socioeconomic History  . Marital status: Significant Other    Spouse name: Not on file  . Number of children: 0  . Years of education: Not on file  . Highest education level: Associate degree: occupational, Scientist, product/process development, or vocational program  Occupational History  . Occupation: Museum/gallery exhibitions officer    Comment: Engineer, maintenance  Social Needs  . Financial resource strain: Very hard  . Food insecurity:    Worry: Often true    Inability: Often true  . Transportation needs:    Medical: No    Non-medical: No  Tobacco Use  . Smoking status: Current Every  Day Smoker    Packs/day: 1.00    Years: 15.00    Pack years: 15.00    Types: Cigarettes  . Smokeless tobacco: Current User  Substance and Sexual Activity  . Alcohol use: No    Frequency: Never    Comment: 6 drinks a day  . Drug use: Yes    Types: Marijuana, Other-see comments, LSD    Comment: in past  . Sexual activity: Yes  Lifestyle  . Physical activity:    Days per week: 0 days    Minutes per session: 0 min  . Stress: Rather much  Relationships  . Social connections:    Talks on phone: Once a week    Gets together: Never    Attends religious service: Never     Active member of club or organization: No    Attends meetings of clubs or organizations: Never    Relationship status: Living with partner  Other Topics Concern  . Not on file  Social History Narrative  . Not on file    Allergies:  Allergies  Allergen Reactions  . Hydroxyzine Hcl Rash    Metabolic Disorder Labs: Lab Results  Component Value Date   HGBA1C 5.2 03/11/2018   MPG 103 03/11/2018   No results found for: PROLACTIN No results found for: CHOL, TRIG, HDL, CHOLHDL, VLDL, LDLCALC No results found for: TSH  Therapeutic Level Labs: No results found for: LITHIUM No results found for: VALPROATE No components found for:  CBMZ  Current Medications: Current Outpatient Medications  Medication Sig Dispense Refill  . albuterol (PROVENTIL HFA;VENTOLIN HFA) 108 (90 Base) MCG/ACT inhaler Inhale 2 puffs into the lungs every 4 (four) hours as needed for wheezing or shortness of breath (cough). 1 Inhaler 2  . benztropine (COGENTIN) 1 MG tablet Take 1 tablet (1 mg total) by mouth 2 (two) times daily. For side effects of risperidone 180 tablet 1  . busPIRone (BUSPAR) 10 MG tablet Take 1 tablet (10 mg total) by mouth 3 (three) times daily. 270 tablet 0  . divalproex (DEPAKOTE ER) 500 MG 24 hr tablet Take 2 tablets (1,000 mg total) by mouth at bedtime. 180 tablet 0  . gabapentin (NEURONTIN) 600 MG tablet Take 1 tablet (600 mg total) by mouth 3 (three) times daily. 270 tablet 1  . risperiDONE (RISPERDAL) 2 MG tablet Take 1 tablet (2 mg total) by mouth at bedtime. 90 tablet 0   No current facility-administered medications for this visit.      Musculoskeletal: Strength & Muscle Tone: within normal limits Gait & Station: normal Patient leans: N/A  Psychiatric Specialty Exam: Review of Systems  Psychiatric/Behavioral: The patient is nervous/anxious.   All other systems reviewed and are negative.   Blood pressure 133/85, pulse 71, temperature 97.6 F (36.4 C), temperature source  Oral, weight 209 lb (94.8 kg).Body mass index is 24.47 kg/m.  General Appearance: Casual  Eye Contact:  Good  Speech:  Clear and Coherent  Volume:  Normal  Mood:  Anxious  Affect:  Congruent  Thought Process:  Goal Directed and Descriptions of Associations: Intact  Orientation:  Full (Time, Place, and Person)  Thought Content: Logical   Suicidal Thoughts:  No  Homicidal Thoughts:  No  Memory:  Immediate;   Fair Recent;   Fair Remote;   Fair  Judgement:  Fair  Insight:  Fair  Psychomotor Activity:  Normal  Concentration:  Concentration: Fair and Attention Span: Fair  Recall:  [employer] of Knowledge: Fair  Language:  Fair  Akathisia:  No  Handed:  Right  AIMS (if indicated): 3, has mild rigidity of UE   Assets:  Communication Skills Desire for Improvement Housing Social Support Talents/Skills Transportation  ADL's:  Intact  Cognition: WNL  Sleep:  Fair   Screenings: AIMS     Office Visit from 10/23/2018 in [facility]  AIMS Total Score  3    PHQ2-9     Office Visit from 08/28/2018 in [facility] Office Visit from 04/09/2018 in [facility] Office Visit from 02/04/2018 in [facility]  PHQ-2 Total Score  0  0  0  PHQ-9 Total Score  -  0  -       Assessment and Plan: Ahan is a 32 year old Caucasian male, starting a new job part-time, lives in [place], married, has a history of paranoid schizophrenia in remission, generalized anxiety disorder, alcoholism in early remission, cannabis use in remission, presented to the clinic today for a follow-up visit.  Patient reports continued anxiety symptoms.  He is going to start a new job which could be contributing to the same.  He will continue psychotherapy with our therapist here in clinic.  Discussed medication changes as noted below.  He has good social support system from his family.  He is motivated to stay on treatment.  Plan Schizophrenia in  remission Continue risperidone 2 mg p.o. nightly Increase Cogentin to 1 mg p.o. twice daily for EPS.  Discussed with patient to use it as needed once his stiffness gets better. AIMS - 3 Depakote ER 1000 mg p.o. daily Will order Depakote level again.  Provided lab slip.  For GAD Gabapentin 600 mg p.o. 3 times daily. Increase BuSpar to 10 mg p.o. 3 times daily Continue psychotherapy  For alcohol use disorder-in early remission ( Last drink was in September ) Continue counseling as well as gabapentin.  For insomnia Melatonin 3 mg p.o. nightly as needed  For tobacco use disorder Provided smoking cessation counseling.  Follow-up in clinic in 1 month or sooner if needed.  More than 50 % of the time was spent for psychoeducation and supportive psychotherapy and care coordination.  This note was generated in part or whole with voice recognition software. Voice recognition is usually quite accurate but there are transcription errors that can and very often do occur. I apologize for any typographical errors that were not detected and corrected.         Jomarie Longs, MD 10/23/2018, 2:44 PM

## 2018-10-28 ENCOUNTER — Telehealth: Payer: Self-pay

## 2018-10-28 NOTE — Telephone Encounter (Signed)
pt called left message that he can not take the buspirone that it  gives him a headache and palpations.  is there something else he can take.

## 2018-10-29 ENCOUNTER — Telehealth: Payer: Self-pay

## 2018-10-29 MED ORDER — ESCITALOPRAM OXALATE 5 MG PO TABS: 5.0000 mg | ORAL_TABLET | Freq: Every day | ORAL | 0 refills | Status: DC

## 2018-10-29 NOTE — Telephone Encounter (Signed)
Called patient , left message to reduce the buspar back to the lower dosage he was on before. Advised him to call back if needed.

## 2018-10-29 NOTE — Telephone Encounter (Signed)
pt called left a message that dr. Elna Breslow had called him and stated to decrease buspar.  he doesnt feel that the adjustment will help he wants to change medication all together.

## 2018-10-29 NOTE — Telephone Encounter (Signed)
Called patient back . Discussed stopping Buspar. Add Lexapro 5 mg .

## 2018-10-31 ENCOUNTER — Telehealth: Payer: Self-pay

## 2018-10-31 NOTE — Telephone Encounter (Signed)
pt called left a message that he was returned dr. Elna Breslow phone call.

## 2018-11-01 ENCOUNTER — Other Ambulatory Visit: Payer: Self-pay | Admitting: Psychiatry

## 2018-11-01 NOTE — Telephone Encounter (Signed)
I called about this few days ago. He may have listened to a previous voicemail. Did not leave any yesterday.

## 2018-11-02 LAB — VALPROIC ACID LEVEL: VALPROIC ACID LVL: 77 ug/mL (ref 50–100)

## 2018-11-20 ENCOUNTER — Other Ambulatory Visit: Payer: Self-pay | Admitting: Psychiatry

## 2018-12-16 ENCOUNTER — Telehealth: Payer: Self-pay | Admitting: Family Medicine

## 2018-12-16 NOTE — Telephone Encounter (Signed)
Called patient to schedule AWV-I.  Patient stated he was at work and he would call back.  lec

## 2018-12-19 NOTE — Telephone Encounter (Signed)
Called patient today and scheduled AWV-S for 02/18/19 at 8:30 am. lec

## 2019-01-01 ENCOUNTER — Encounter: Payer: Self-pay | Admitting: Family Medicine

## 2019-01-01 ENCOUNTER — Ambulatory Visit (INDEPENDENT_AMBULATORY_CARE_PROVIDER_SITE_OTHER): Payer: Medicare Other | Admitting: Family Medicine

## 2019-01-01 ENCOUNTER — Other Ambulatory Visit: Payer: Self-pay | Admitting: Family Medicine

## 2019-01-01 VITALS — BP 125/74 | HR 74 | Temp 98.1°F | Resp 16 | Ht 77.0 in | Wt 214.0 lb

## 2019-01-01 DIAGNOSIS — F200 Paranoid schizophrenia: Secondary | ICD-10-CM

## 2019-01-01 DIAGNOSIS — J4520 Mild intermittent asthma, uncomplicated: Secondary | ICD-10-CM

## 2019-01-01 DIAGNOSIS — J0100 Acute maxillary sinusitis, unspecified: Secondary | ICD-10-CM

## 2019-01-01 DIAGNOSIS — Z79899 Other long term (current) drug therapy: Secondary | ICD-10-CM

## 2019-01-01 DIAGNOSIS — Z720 Tobacco use: Secondary | ICD-10-CM

## 2019-01-01 DIAGNOSIS — Z0000 Encounter for general adult medical examination without abnormal findings: Secondary | ICD-10-CM

## 2019-01-01 MED ORDER — GUAIFENESIN-CODEINE 100-10 MG/5ML PO SYRP: 5.0000 mL | ORAL_SOLUTION | Freq: Every evening | ORAL | 0 refills | Status: DC | PRN

## 2019-01-01 NOTE — Patient Instructions (Addendum)
Thank you for coming to the office today.  1. It sounds like you had an Upper Respiratory Virus or Allergies that cause sinus drainage that has settled into a Bronchitis, lower respiratory tract infection. I don't have concerns for pneumonia today, and think that this should gradually improve. Once you are feeling better, the cough may take a few weeks to fully resolve.  You do not need antibiotics at this time. However, if it does not improve or your symptoms worsen as we have discussed, please follow-up to determine if we need to change your treatment.  - IF not improving after 24-48 hours, or develop worsening fever, productive cough then NOTIFY OFFICE we can consider sending in an antibiotic OR prednisone if coughing is worse like asthma but you feel better  - Drink plenty of fluids to improve congestion - You may try over the counter Nasal Saline spray (Simply Saline, Ocean Spray) as needed to reduce congestion. - May take Tylenol (up to 500-1000mg  per dose) 3 times a day as needed for aches and fevers. If it is safe for you to take ibuprofen or advil, you may take these medicines as needed as well.  If your symptoms seem to worsen instead of improve over next several days, including significant fever / chills, worsening shortness of breath, worsening wheezing, or nausea / vomiting and can't take medicines - return sooner or go to hospital Emergency Department for more immediate treatment.    DUE for FASTING BLOOD WORK (no food or drink after midnight before the lab appointment, only water or coffee without cream/sugar on the morning of)  SCHEDULE "Lab Only" visit in the morning at the clinic for lab draw in 6 WEEKS  - Make sure Lab Only appointment is at about 1 week before your next appointment, so that results will be available  For Lab Results, once available within 2-3 days of blood draw, you can can log in to MyChart online to view your results and a brief explanation. Also, we can  discuss results at next follow-up visit.   Please schedule a Follow-up Appointment to: Return in about 7 weeks (around 02/18/2019) for Already scheduled for AMW 02/18/19 - add annual physical +EKG and fasting lab that morning.  If you have any other questions or concerns, please feel free to call the office or send a message through MyChart. You may also schedule an earlier appointment if necessary.  Additionally, you may be receiving a survey about your experience at our office within a few days to 1 week by e-mail or mail. We value your feedback.  Saralyn Pilar, DO [facility], [place]

## 2019-01-01 NOTE — Progress Notes (Signed)
Subjective:    Patient ID: Rodney [name], male    DOB: [birth year], 33 y.o.   MRN: [id number]  Rodney [name] is a 33 y.o. male presenting on 01/01/2019 for Cough (worst at night clear mucus, HA onset 5 days)   HPI   ACUTE BRONCHITIS / Asthma, mild intermittent without acute exacerbation Reports that for past 5 days he has had symptoms of URI with congestion in sinus with some clear mucus drainage and some pressure. Not improving, seems worse, keeping him awake at night, may fall asleep alright, then few hours in, he has a coughing spell that wakes him up without significant improvement. - He tried OTC cough medicine and cough drops. Humidifier was on overnight but then seemed to not help - Admits some sinus pressure and headaches during day - Admits occasional noisy breathing and some tightness in breathing, he is using Albuterol regularly PRN with good result. He has former diagnosis of asthma, no formal pulmonary PFT testing. - Denies any fevers, chills, sweats, body aches, nausea vomiting  Additional update: Psychiatry - Chronic use of Risperidone, his Psychiatrist asked him to get an EKG test in near future to monitor for arrhythmia or side effect of medication. He asks to have this done at next visit.  Health Maintenance: Due for Flu Shot, declines today despite counseling on benefits   Depression screen [facility] 2/9 08/28/2018 04/09/2018 02/04/2018  Decreased Interest 0 0 0  Down, Depressed, Hopeless 0 0 0  PHQ - 2 Score 0 0 0  Altered sleeping - 0 -  Tired, decreased energy - 0 -  Change in appetite - 0 -  Feeling bad or failure about yourself  - 0 -  Trouble concentrating - 0 -  Moving slowly or fidgety/restless - 0 -  Suicidal thoughts - 0 -  PHQ-9 Score - 0 -  Difficult doing work/chores - Not difficult at all -    Social History   Tobacco Use  . Smoking status: Current Every Day Smoker    Packs/day: 1.00    Years: 15.00    Pack years: 15.00    Types: Cigarettes  .  Smokeless tobacco: Current User  Substance Use Topics  . Alcohol use: No    Frequency: Never    Comment: 6 drinks a day  . Drug use: Yes    Types: Marijuana, Other-see comments, LSD    Comment: in past    Review of Systems Per HPI unless specifically indicated above     Objective:    BP 125/74   Pulse 74   Temp 98.1 F (36.7 C) (Oral)   Resp 16   Ht 6\' 5"  (1.956 m)   Wt 214 lb (97.1 kg)   SpO2 98%   BMI 25.38 kg/m   Wt Readings from Last 3 Encounters:  01/01/19 214 lb (97.1 kg)  08/28/18 204 lb 12.8 oz (92.9 kg)  04/09/18 202 lb (91.6 kg)    Physical Exam Vitals signs and nursing note reviewed.  Constitutional:      [name]: He is not in acute distress.    Appearance: He is well-developed. He is not diaphoretic.     Comments: Well-appearing, comfortable, cooperative  HENT:     Head: Normocephalic and atraumatic.     Comments: Nasal / maxillary sinuses mild tender. Nares patent without purulence or edema. Bilateral TMs clear without erythema, effusion or bulging. Oropharynx clear without erythema, exudates, edema or asymmetry. Eyes:     [name]:  Right eye: No discharge.        Left eye: No discharge.     Conjunctiva/sclera: Conjunctivae normal.  Neck:     Musculoskeletal: Normal range of motion and neck supple.     Thyroid: No thyromegaly.  Cardiovascular:     Rate and Rhythm: Normal rate and regular rhythm.     Heart sounds: Normal heart sounds. No murmur.  Pulmonary:     Effort: Pulmonary effort is normal. No respiratory distress.     Breath sounds: No rales.     Comments: Mild coarse breath sounds bilateral bases, some rhonchi, clear with cough RLL compared to left. No focal wheezing. No focal crackles. Good air movement overall. Speaks full sentences. Rare cough Musculoskeletal: Normal range of motion.  Lymphadenopathy:     Cervical: No cervical adenopathy.  Skin:    [name]: Skin is warm and dry.     Findings: No erythema or rash.  Neurological:      Mental Status: He is alert and oriented to person, place, and time.  Psychiatric:        Behavior: Behavior normal.     Comments: Well groomed, good eye contact, normal speech and thoughts           Assessment & Plan:   Problem List Items Addressed This Visit    None    Visit Diagnoses    Acute non-recurrent maxillary sinusitis    -  Primary   Relevant Medications   guaiFENesin-codeine (ROBITUSSIN AC) 100-10 MG/5ML syrup      Consistent with persistent acute maxillary sinusitis, likely initial viral URI onset - seems worse at night with settling congestion Duration near 1 week without improvement Complicated by history of mild intermittent asthma - improved on albuterol - without acute wheezing now - Afebrile, without other focal signs of infection (not consistent with pneumonia by history or exam)  Plan: 1. Reassurance, continue supportive measures for now - focus on cough, start cough syrup rx guaifenesin-codeine help sleep at night and suppress/clear congestion - Continue albuterol inhaler PRN - May add decongestant spray atrovent if interested in near future - he declined today - Hold antibiotics at this time and clinically not entirely consistent with acute asthma exac, will hold prednisone as well, concern w/ psych meds will defer use of prednisone until required If not improving 24-48 hours - contact office with symptom update we may add antibiotic +/- prednisone Return criteria reviewed, follow-up within 1 week if not improved   Meds ordered this encounter  Medications  . guaiFENesin-codeine (ROBITUSSIN AC) 100-10 MG/5ML syrup    Sig: Take 5-10 mLs by mouth at bedtime as needed for cough.    Dispense:  118 mL    Refill:  0     Follow up plan: Return in about 7 weeks (around 02/18/2019) for Already scheduled for AMW 02/18/19 - add annual physical +EKG and fasting lab that morning.  Future labs ordered for 02/18/19   Saralyn PilarAlexander Pratik Dalziel, DO [facility] 01/01/2019, 8:15 AM

## 2019-01-01 NOTE — Addendum Note (Signed)
Addended by: Smitty Cords on: 01/01/2019 08:55 AM   Modules accepted: Level of Service

## 2019-01-17 ENCOUNTER — Other Ambulatory Visit: Payer: Self-pay

## 2019-01-17 ENCOUNTER — Encounter: Payer: Self-pay | Admitting: Psychiatry

## 2019-01-17 ENCOUNTER — Ambulatory Visit (INDEPENDENT_AMBULATORY_CARE_PROVIDER_SITE_OTHER): Payer: Medicare Other | Admitting: Psychiatry

## 2019-01-17 VITALS — BP 127/79 | HR 77 | Temp 97.5°F | Wt 212.8 lb

## 2019-01-17 DIAGNOSIS — F17200 Nicotine dependence, unspecified, uncomplicated: Secondary | ICD-10-CM

## 2019-01-17 DIAGNOSIS — F200 Paranoid schizophrenia: Secondary | ICD-10-CM

## 2019-01-17 DIAGNOSIS — F1010 Alcohol abuse, uncomplicated: Secondary | ICD-10-CM

## 2019-01-17 DIAGNOSIS — F411 Generalized anxiety disorder: Secondary | ICD-10-CM | POA: Diagnosis not present

## 2019-01-17 DIAGNOSIS — F1211 Cannabis abuse, in remission: Secondary | ICD-10-CM

## 2019-01-17 MED ORDER — RISPERIDONE 2 MG PO TABS: 2.0000 mg | ORAL_TABLET | Freq: Every day | ORAL | 1 refills | Status: DC

## 2019-01-17 NOTE — Progress Notes (Signed)
BH MD OP Progress Note  01/17/2019 9:14 AM Rodney [name]  MRN:  [id number]  Chief Complaint: ' I am here for follow up." Chief Complaint    Follow-up     HPI: Rodney [name] is a 33 year old Caucasian male, employed, lives in [place], married, has a history of schizophrenia currently in remission, anxiety disorder, alcohol use disorder in early remission, cannabis use disorder in remission, presented to the clinic today for a follow-up visit.  Patient today reports that he has been doing better with regards to his mood symptoms.  He denies any perceptual disturbances.  He reports he did not start the Lexapro which was started recently and he also stopped taking the BuSpar.  He reports he had a few symptoms which made him anxious and he thought maybe he was having serotonin syndrome.  He reports he only takes risperidone, Cogentin , the gabapentin and Depakote at this time.  He reports his anxiety symptoms are under control on the current medications.  Patient reports sleep is good.  He continues to take melatonin as needed.  Patient continues to smoke cigarettes.  He reports he has been trying to cut down.  He reports he drinks alcohol on the weekends and is cautious about binging or drinking too much.  Patient denies any blackouts, withdrawal symptoms.  He reports he has been trying to limit use as much as possible.  He reports he works 2 jobs right now.  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  He reports work is going well.  He was able to get a new puppy, named it 'sweet red'.  Patient reports that has helped him a lot.    Visit Diagnosis:    ICD-10-CM   1. Paranoid schizophrenia (HCC) F20.0 risperiDONE (RISPERDAL) 2 MG tablet   in remission  2. GAD (generalized anxiety disorder) F41.1    improving  3. Alcohol use disorder, mild, abuse F10.10   4. Tobacco use disorder F17.200   5. Cannabis use disorder, mild, in sustained remission F12.11     Past Psychiatric  History: Reviewed past psychiatric history from my progress note on 09/23/2018.  Past Medical History:  Past Medical History:  Diagnosis Date  . Anxiety   . Bipolar disorder (HCC)   . CIGARETTE SMOKER 02/08/2011   Qualifier: Diagnosis of  By: Laural BenesJohnson MD, [place]    . Seizures (HCC)     Past Surgical History:  Procedure Laterality Date  . WISDOM TOOTH EXTRACTION      Family Psychiatric History: I have reviewed family psychiatric history from my progress note on 09/23/2018.  Family History:  Family History  Problem Relation Age of Onset  . Anxiety disorder Mother   . OCD Brother   . Anxiety disorder Maternal Aunt   . Anxiety disorder Maternal Grandmother   . Anxiety disorder Paternal Grandfather     Social History: Reviewed social history from my progress note on 09/23/2018. Social History   Socioeconomic History  . Marital status: Significant Other    Spouse name: Not on file  . Number of children: 0  . Years of education: Not on file  . Highest education level: Associate degree: occupational, Scientist, product/process developmenttechnical, or vocational program  Occupational History  . Occupation: Museum/gallery exhibitions officerorklift Driver    Comment: [place]  Social Needs  . Financial resource strain: Very hard  . Food insecurity:    Worry: Often true    Inability: Often true  . Transportation needs:    Medical:  No    Non-medical: No  Tobacco Use  . Smoking status: Current Every Day Smoker    Packs/day: 1.00    Years: 15.00    Pack years: 15.00    Types: Cigarettes  . Smokeless tobacco: Current User  Substance and Sexual Activity  . Alcohol use: No    Frequency: Never    Comment: 6 drinks a day  . Drug use: Yes    Types: Marijuana, Other-see comments, LSD    Comment: in past  . Sexual activity: Yes  Lifestyle  . Physical activity:    Days per week: 0 days    Minutes per session: 0 min  . Stress: Rather much  Relationships  . Social connections:    Talks on phone: Once a week    Gets together: Never     Attends religious service: Never    Active member of club or organization: No    Attends meetings of clubs or organizations: Never    Relationship status: Living with partner  Other Topics Concern  . Not on file  Social History Narrative  . Not on file    Allergies:  Allergies  Allergen Reactions  . Hydroxyzine Hcl Rash    Metabolic Disorder Labs: Lab Results  Component Value Date   HGBA1C 5.2 03/11/2018   MPG 103 03/11/2018   No results found for: PROLACTIN No results found for: CHOL, TRIG, HDL, CHOLHDL, VLDL, LDLCALC No results found for: TSH  Therapeutic Level Labs: No results found for: LITHIUM Lab Results  Component Value Date   VALPROATE 77 11/01/2018   No components found for:  CBMZ  Current Medications: Current Outpatient Medications  Medication Sig Dispense Refill  . albuterol (PROVENTIL HFA;VENTOLIN HFA) 108 (90 Base) MCG/ACT inhaler Inhale 2 puffs into the lungs every 4 (four) hours as needed for wheezing or shortness of breath (cough). 1 Inhaler 2  . benztropine (COGENTIN) 1 MG tablet Take 1 tablet (1 mg total) by mouth 2 (two) times daily. For side effects of risperidone 180 tablet 1  . divalproex (DEPAKOTE ER) 500 MG 24 hr tablet Take 2 tablets (1,000 mg total) by mouth at bedtime. 180 tablet 0  . gabapentin (NEURONTIN) 600 MG tablet Take 1 tablet (600 mg total) by mouth 3 (three) times daily. 270 tablet 1  . guaiFENesin-codeine (ROBITUSSIN AC) 100-10 MG/5ML syrup Take 5-10 mLs by mouth at bedtime as needed for cough. 118 mL 0  . risperiDONE (RISPERDAL) 2 MG tablet Take 1 tablet (2 mg total) by mouth at bedtime. 90 tablet 1   No current facility-administered medications for this visit.      Musculoskeletal: Strength & Muscle Tone: within normal limits Gait & Station: normal Patient leans: N/A  Psychiatric Specialty Exam: Review of Systems  Psychiatric/Behavioral: Negative for depression. The patient is not nervous/anxious.   All other systems  reviewed and are negative.   Blood pressure 127/79, pulse 77, temperature (!) 97.5 F (36.4 C), temperature source Oral, weight 212 lb 12.8 oz (96.5 kg).Body mass index is 25.23 kg/m.  General Appearance: Casual  Eye Contact:  Fair  Speech:  Clear and Coherent  Volume:  Normal  Mood:  Euthymic  Affect:  Appropriate  Thought Process:  Goal Directed and Descriptions of Associations: Intact  Orientation:  Full (Time, Place, and Person)  Thought Content: Logical   Suicidal Thoughts:  No  Homicidal Thoughts:  No  Memory:  Immediate;   Fair Recent;   Fair Remote;   Fair  Judgement:  Fair  Insight:  Fair  Psychomotor Activity:  Normal  Concentration:  Concentration: Fair and Attention Span: Fair  Recall:  FiservFair  Fund of Knowledge: Fair  Language: Fair  Akathisia:  No  Handed:  Right  AIMS (if indicated): 0  Assets:  Communication Skills Desire for Improvement Social Support  ADL's:  Intact  Cognition: WNL  Sleep:  Fair   Screenings: AIMS     [facility]23/2018 [facility]  AIMS Total Score  3    PHQ2-9     [facility] Office Visit from [facility]09/2018 in [facility] Office Visit from 02[facility]04/2018 in [facility]  PHQ-2 Total Score  0  0  0  PHQ-9 Total Score  -  0  -       Assessment and Plan:  Rodney [name] is a 33 year old Caucasian male, lives in [place], married, has a history of paranoid schizophrenia in remission, anxiety disorder alcohol, cannabis abuse in remission, presented to the clinic today for a follow-up visit.  In today reports he is currently making progress on the current medication regimen.  He has been noncompliant with some of his medications however reports his mood symptoms are stable.  He was able to get Depakote levels done.  Discussed plan as noted below.  Plan Schizophrenia in remission Continue risperidone 2 mg p.o. nightly Cogentin 1 mg p.o.  twice daily for EPS Aims equals 0. Continue Depakote ER 1000 mg p.o. daily. I have reviewed and discussed Depakote labs with patient-reported after his last visit.  Depakote levels-77-therapeutic on 11/21/2018.  GAD- improving Gabapentin 600 mg p.o. 3 times daily Discontinue BuSpar for noncompliance. Continue psychotherapy.  For alcohol use disorder- mild Patient reports he has started drinking again on weekends.  He however has been limiting use.  For insomnia-improving Melatonin 3 mg p.o. nightly as needed  For tobacco use disorder-improving Provided smoking cessation counseling.  Follow-up in clinic in 3 months or sooner if needed.  I have spent atleast 25 minutes  face to face with patient today. More than 50 % of the time was spent for psychoeducation and supportive psychotherapy and care coordination.  This note was generated in part or whole with voice recognition software. Voice recognition is usually quite accurate but there are transcription errors that can and very often do occur. I apologize for any typographical errors that were not detected and corrected.           Jomarie LongsSaramma Kage Willmann, MD 01/17/2019, 9:14 AM

## 2019-01-19 ENCOUNTER — Other Ambulatory Visit: Payer: Self-pay | Admitting: Psychiatry

## 2019-01-19 DIAGNOSIS — F411 Generalized anxiety disorder: Secondary | ICD-10-CM

## 2019-02-08 ENCOUNTER — Other Ambulatory Visit: Payer: Self-pay | Admitting: Family Medicine

## 2019-02-08 DIAGNOSIS — J4520 Mild intermittent asthma, uncomplicated: Secondary | ICD-10-CM

## 2019-02-18 ENCOUNTER — Other Ambulatory Visit: Payer: Medicare Other

## 2019-02-18 ENCOUNTER — Ambulatory Visit (INDEPENDENT_AMBULATORY_CARE_PROVIDER_SITE_OTHER): Payer: Medicare Other

## 2019-02-18 ENCOUNTER — Encounter: Payer: Self-pay | Admitting: Family Medicine

## 2019-02-18 ENCOUNTER — Ambulatory Visit (INDEPENDENT_AMBULATORY_CARE_PROVIDER_SITE_OTHER): Payer: Medicare Other | Admitting: Family Medicine

## 2019-02-18 VITALS — BP 128/60 | HR 62 | Temp 97.7°F | Resp 16 | Ht 77.0 in | Wt 214.0 lb

## 2019-02-18 VITALS — BP 138/80 | HR 62 | Temp 97.7°F | Resp 16 | Ht 77.0 in | Wt 214.0 lb

## 2019-02-18 DIAGNOSIS — J3089 Other allergic rhinitis: Secondary | ICD-10-CM

## 2019-02-18 DIAGNOSIS — F200 Paranoid schizophrenia: Secondary | ICD-10-CM | POA: Diagnosis not present

## 2019-02-18 DIAGNOSIS — Z720 Tobacco use: Secondary | ICD-10-CM | POA: Diagnosis not present

## 2019-02-18 DIAGNOSIS — Z79899 Other long term (current) drug therapy: Secondary | ICD-10-CM

## 2019-02-18 DIAGNOSIS — Z0000 Encounter for general adult medical examination without abnormal findings: Secondary | ICD-10-CM

## 2019-02-18 DIAGNOSIS — T50905A Adverse effect of unspecified drugs, medicaments and biological substances, initial encounter: Secondary | ICD-10-CM

## 2019-02-18 DIAGNOSIS — F1020 Alcohol dependence, uncomplicated: Secondary | ICD-10-CM

## 2019-02-18 DIAGNOSIS — J4520 Mild intermittent asthma, uncomplicated: Secondary | ICD-10-CM

## 2019-02-18 NOTE — Addendum Note (Signed)
Addended by: Marin Roberts A on: 02/18/2019 08:58 AM   Modules accepted: Level of Service, SmartSet

## 2019-02-18 NOTE — Progress Notes (Addendum)
Subjective:   Rodney [name] is a 33 y.o. male who presents for an Initial Medicare Annual Wellness Visit.  Review of Systems   Cardiac Risk Factors include: male gender;smoking/ tobacco exposure;sedentary lifestyle    Objective:    Today's Vitals   02/18/19 0835 02/18/19 0850  BP: (!) 152/82 138/80  Pulse: 62   Resp: 16   Temp: 97.7 F (36.5 C)   TempSrc: Oral   Weight: 214 lb (97.1 kg)   Height: 6\' 5"  (1.956 m)    Body mass index is 25.38 kg/m.  Advanced Directives 02/18/2019  Does Patient Have a Medical Advance Directive? No  Would patient like information on creating a medical advance directive? No - Patient declined  Some encounter information is confidential and restricted. Go to Review Flowsheets activity to see all data.    Current Medications (verified) Outpatient Encounter Medications as of 02/18/2019  Medication Sig  . benztropine (COGENTIN) 1 MG tablet Take 1 tablet (1 mg total) by mouth 2 (two) times daily. For side effects of risperidone  . divalproex (DEPAKOTE ER) 500 MG 24 hr tablet Take 2 tablets (1,000 mg total) by mouth at bedtime.  . gabapentin (NEURONTIN) 600 MG tablet Take 1 tablet (600 mg total) by mouth 3 (three) times daily.  Marland Kitchen PROAIR HFA 108 (90 Base) MCG/ACT inhaler INHALE 2 PUFFS INTO THE LUNGS EVERY 4 HOURS AS NEEDED FOR WHEEZING OR SHORTNESS OF BREATH (COUGH).  Marland Kitchen risperiDONE (RISPERDAL) 2 MG tablet Take 1 tablet (2 mg total) by mouth at bedtime.  . [DISCONTINUED] guaiFENesin-codeine (ROBITUSSIN AC) 100-10 MG/5ML syrup Take 5-10 mLs by mouth at bedtime as needed for cough. (Patient not taking: Reported on 02/18/2019)   No facility-administered encounter medications on file as of 02/18/2019.     Allergies (verified) Hydroxyzine hcl   History: Past Medical History:  Diagnosis Date  . Anxiety   . Bipolar disorder (HCC)   . CIGARETTE SMOKER 02/08/2011   Qualifier: Diagnosis of  By: Laural Benes MD, [place]    . Seizures (HCC)    Past Surgical  History:  Procedure Laterality Date  . WISDOM TOOTH EXTRACTION     Family History  Problem Relation Age of Onset  . Anxiety disorder Mother   . OCD Brother   . Anxiety disorder Maternal Aunt   . Anxiety disorder Maternal Grandmother   . Anxiety disorder Paternal Grandfather    Social History   Socioeconomic History  . Marital status: Significant Other    Spouse name: Not on file  . Number of children: 0  . Years of education: Not on file  . Highest education level: Associate degree: occupational, Scientist, product/process development, or vocational program  Occupational History  . Occupation: Location manager     Comment: [employer]   Social Needs  . Financial resource strain: Very hard  . Food insecurity:    Worry: Often true    Inability: Often true  . Transportation needs:    Medical: No    Non-medical: No  Tobacco Use  . Smoking status: Current Every Day Smoker    Packs/day: 1.00    Years: 15.00    Pack years: 15.00    Types: Cigarettes  . Smokeless tobacco: Never Used  Substance and Sexual Activity  . Alcohol use: Yes    Frequency: Never    Comment: 6 drinks a day  . Drug use: Not Currently    Types: Marijuana, Other-see comments, LSD    Comment: in past  . Sexual activity: Yes  Lifestyle  . Physical activity:    Days per week: 0 days    Minutes per session: 0 min  . Stress: Rather much  Relationships  . Social connections:    Talks on phone: Once a week    Gets together: Never    Attends religious service: Never    Active member of club or organization: No    Attends meetings of clubs or organizations: Never    Relationship status: Living with partner  Other Topics Concern  . Not on file  Social History Narrative  . Not on file   Tobacco Counseling Ready to quit: No Counseling given: Yes   Clinical Intake:  Pre-visit preparation completed: Yes  Pain : No/denies pain     Nutritional Status: BMI 25 -29 Overweight Nutritional Risks: None Diabetes: No  How often  do you need to have someone help you when you read instructions, pamphlets, or other written materials from your doctor or pharmacy?: 1 - Never  Interpreter Needed?: No  Information entered by ::  ,LPN   Activities of Daily Living In your present state of health, do you have any difficulty performing the following activities: 02/18/2019  Hearing? N  Vision? N  Difficulty concentrating or making decisions? Y  Comment sometimes comes back   Walking or climbing stairs? N  Dressing or bathing? N  Doing errands, shopping? N  Using the Toilet? N  In the past six months, have you accidently leaked urine? N  Do you have problems with loss of bowel control? N  Managing your Medications? N  Managing your Finances? N  Housekeeping or managing your Housekeeping? N  Some recent data might be hidden     Immunizations and Health Maintenance Immunization History  Administered Date(s) Administered  . Pneumococcal Polysaccharide-23 08/09/2015   There are no preventive care reminders to display for this patient.  Patient Care Team: Smitty CordsKaramalegos, Alexander J, DO as PCP - General (Family Medicine)  Indicate any recent Medical Services you may have received from other than [facility] providers in the past year (date may be approximate).    Assessment:   This is a routine wellness examination for Tullio.  Hearing/Vision screen Vision Screening Comments: [facility] annually   Dietary issues and exercise activities discussed: Current Exercise Habits: The patient does not participate in regular exercise at present, Exercise limited by: None identified  Goals    . Quit Smoking     Smoking cessation discussed- not ready to quit at this time       Depression Screen PHQ 2/9 Scores 02/18/2019 08/28/2018 04/09/2018 02/04/2018  PHQ - 2 Score 0 0 0 0  PHQ- 9 Score - - 0 -    Fall Risk Fall Risk  02/18/2019 08/28/2018 02/04/2018  Falls in the past year? 0 No No    FALL RISK PREVENTION PERTAINING TO  THE HOME:  Any stairs in or around the home? Yes  If so, are there any without handrails? No   Home free of loose throw rugs in walkways, pet beds, electrical cords, etc? No  Adequate lighting in your home to reduce risk of falls? Yes   ASSISTIVE DEVICES UTILIZED TO PREVENT FALLS:  Life alert? No  Use of a cane, walker or w/c? No  Grab bars in the bathroom? No  Shower chair or bench in shower? No  Elevated toilet seat or a handicapped toilet? No   DME ORDERS:  DME order needed?  No   TIMED UP AND GO:  Was  the test performed? Yes .  Length of time to ambulate 10 feet: 11 sec.   GAIT:  Appearance of gait: Gait stead-fast without the use of an assistive device.  Education: Fall risk prevention has been discussed.  Intervention(s) required? No    Cognitive Function:     6CIT Screen 02/18/2019  What Year? 0 points  What month? 0 points  What time? 0 points  Count back from 20 0 points  Months in reverse 0 points  Repeat phrase 0 points  Total Score 0    Screening Tests Health Maintenance  Topic Date Due  . TETANUS/TDAP  02/23/2019 (Originally 10/12/2003)  . INFLUENZA VACCINE  03/25/2019 (Originally 07/25/2018)  . HIV Screening  Completed    Qualifies for Shingles Vaccine? No    Tdap: Although this vaccine is not a covered service during a Wellness Exam, does the patient still wish to receive this vaccine today?  No .  Education has been provided regarding the importance of this vaccine. Advised may receive this vaccine at local pharmacy or Health Dept. Aware to provide a copy of the vaccination record if obtained from local pharmacy or Health Dept. Verbalized acceptance and understanding.  Flu Vaccine: Due for Flu vaccine. Does the patient want to receive this vaccine today?  No . Education has been provided regarding the importance of this vaccine but still declined. Advised may receive this vaccine at local pharmacy or Health Dept. Aware to provide a copy of the  vaccination record if obtained from local pharmacy or Health Dept. Verbalized acceptance and understanding.  Pneumococcal Vaccine: not indicated   Cancer Screenings:  Colorectal Screening: not indicated, no family history- due at age 26   Lung Cancer Screening: (Low Dose CT Chest recommended if Age 29-80 years, 30 pack-year currently smoking OR have quit w/in 15years.) does not qualify.    Additional Screening:  Hepatitis C Screening: does not qualify  Vision Screening: Recommended annual ophthalmology exams for early detection of glaucoma and other disorders of the eye. Is the patient up to date with their annual eye exam?  Yes  Who is the provider or what is the name of the office in which the pt attends annual eye exams? [facility]   Dental Screening: Recommended annual dental exams for proper oral hygiene  Community Resource Referral:  CRR required this visit?  No       Plan:    I have personally reviewed and addressed the Medicare Annual Wellness questionnaire and have noted the following in the patient's chart:  A. Medical and social history B. Use of alcohol, tobacco or illicit drugs  C. Current medications and supplements D. Functional ability and status E.  Nutritional status F.  Physical activity G. Advance directives H. List of other physicians I.  Hospitalizations, surgeries, and ER visits in previous 12 months J.  Vitals K. Screenings such as hearing and vision if needed, cognitive and depression L. Referrals and appointments   In addition, I have reviewed and discussed with patient certain preventive protocols, quality metrics, and best practice recommendations. A written personalized care plan for preventive services as well as general preventive health recommendations were provided to patient.   Signed,  Marin Roberts, LPN Nurse Health Advisor   Nurse Notes:none

## 2019-02-18 NOTE — Assessment & Plan Note (Signed)
Clinically daily alcohol intake with some history of prolonged use, at risk of alcohol dependence / overuse  Reviewed alcohol cessation techniques, recommendations, and future discuss with psych for additional resources, when ready to quit and cut back advised to do safely and coordinate with [place].

## 2019-02-18 NOTE — Assessment & Plan Note (Signed)
Likely source of lingering congestion / cough  Start OTC nasal steroid Flonase 2 sprays in each nostril daily for 4-6 weeks, may repeat course seasonally or as needed

## 2019-02-18 NOTE — Assessment & Plan Note (Addendum)
Stable, chronic problem, now >11+ years in remission Followed by [facility] Dr Elna Breslow locally now with good results On current med regimen per Psych  Checked EKG due to medication, risk of side effect QTc, will make results available to Psychiatry and offered check Depakote level today but blood already drawn, did not have correct special tube for this, he opted to wait to discuss with Dr Elna Breslow at next visit 4/24 and they will likely draw then

## 2019-02-18 NOTE — Assessment & Plan Note (Signed)
Currently without acute asthma flare Will try underlying rhinitis for congestion / cough Start OTC Flonase trial Follow-up if worse asthma flare

## 2019-02-18 NOTE — Patient Instructions (Signed)
Mr. Rodney [name] , Thank you for taking time to come for your Medicare Wellness Visit. I appreciate your ongoing commitment to your health goals. Please review the following plan we discussed and let me know if I can assist you in the future.   Screening recommendations/referrals: Colonoscopy: not indicated  Recommended yearly ophthalmology/optometry visit for glaucoma screening and checkup Recommended yearly dental visit for hygiene and checkup  Vaccinations: Influenza vaccine: declined Pneumococcal vaccine: not indicated Tdap vaccine: declined Shingles vaccine: not indicated   Advanced directives: declined  Conditions/risks identified: smoking cessation discussed- not ready to quit   Next appointment: Follow up in one year for your annual wellness exam.    Preventive Care 18-39 Years, Male Preventive care refers to lifestyle choices and visits with your health care provider that can promote health and wellness. What does preventive care include?   A yearly physical exam. This is also called an annual well check.  Dental exams once or twice a year.  Routine eye exams. Ask your health care provider how often you should have your eyes checked.  Personal lifestyle choices, including: ? Daily care of your teeth and gums. ? Regular physical activity. ? Eating a healthy diet. ? Avoiding tobacco and drug use. ? Limiting alcohol use. ? Practicing safe sex. What happens during an annual well check? The services and screenings done by your health care provider during your annual well check will depend on your age, overall health, lifestyle risk factors, and family history of disease. Counseling Your health care provider may ask you questions about your:  Alcohol use.  Tobacco use.  Drug use.  Emotional well-being.  Home and relationship well-being.  Sexual activity.  Eating habits.  Work and work Statistician. Screening You may have the following tests or  measurements:  Height, weight, and BMI.  Blood pressure.  Lipid and cholesterol levels. These may be checked every 5 years starting at age 58.  Diabetes screening. This is done by checking your blood sugar (glucose) after you have not eaten for a while (fasting).  Skin check.  Hepatitis C blood test.  Hepatitis B blood test.  Sexually transmitted disease (STD) testing. Discuss your test results, treatment options, and if necessary, the need for more tests with your health care provider. Vaccines Your health care provider may recommend certain vaccines, such as:  Influenza vaccine. This is recommended every year.  Tetanus, diphtheria, and acellular pertussis (Tdap, Td) vaccine. You may need a Td booster every 10 years.  Varicella vaccine. You may need this if you have not been vaccinated.  HPV vaccine. If you are 5 or younger, you may need three doses over 6 months.  Measles, mumps, and rubella (MMR) vaccine. You may need at least one dose of MMR.You may also need a second dose.  Pneumococcal 13-valent conjugate (PCV13) vaccine. You may need this if you have certain conditions and have not been vaccinated.  Pneumococcal polysaccharide (PPSV23) vaccine. You may need one or two doses if you smoke cigarettes or if you have certain conditions.  Meningococcal vaccine. One dose is recommended if you are age 65-21 years and a first-year college student living in a residence hall, or if you have one of several medical conditions. You may also need additional booster doses.  Hepatitis A vaccine. You may need this if you have certain conditions or if you travel or work in places where you may be exposed to hepatitis A.  Hepatitis B vaccine. You may need this if you have certain  conditions or if you travel or work in places where you may be exposed to hepatitis B.  Haemophilus influenzae type b (Hib) vaccine. You may need this if you have certain risk factors. Talk to your health care  provider about which screenings and vaccines you need and how often you need them. This information is not intended to replace advice given to you by your health care provider. Make sure you discuss any questions you have with your health care provider. Document Released: 02/06/2002 Document Revised: 07/24/2017 Document Reviewed: 10/12/2015 Elsevier Interactive Patient Education  2019 [employer].

## 2019-02-18 NOTE — Progress Notes (Addendum)
Subjective:    Patient ID: Rodney [name], male    DOB: May 18, 1986, 33 y.o.   MRN: [id number]  Rodney [name] is a [age] y.o. male presenting on 02/18/2019 for Annual Exam and Paranoid   HPI   Here for Annual Physical and fasting labs drawn today. He was also seen by Marin Robertsiffany Hill LPN today for Annual Medicare Wellness.  Annual Physical / Lifestyle - He is doing well overall - Limited regular exercise - Diet is balanced, but he does admit sometimes limited by time and convenience  Paranoid Schizophrenia in Remission / Generalized Anxiety Followed by [facility] Dr Elna BreslowEappen, last visit 01/17/19, every 3 months - He is doing well on current medication, no new concerns. - Current treatment: Risperidone 2mg  nightly, COgentin 1mg  BID for EPS, Gabapentin 600mg  TID, Depakote ER 1000mg  daily, she had checked depakote level in past, this was not collected today - Regarding anxiety - he has had improvement overall - He has had psychotherapy / therapist available - See ROS below and documentation in CareEverywhere from Psychiatry  Alcohol dependence, mild to moderate Reports he still continues to drink alcohol daily, mostly beer. About avg 6 per day, usually depending on stress and other factors. - Prior history of quit alcohol in past Oct - Nov 2019, he is aware he can quit in future again. - He denies withdrawal symptoms, tremors, seizures or other complication  Tobacco Abuse Active smoker, >15 years 1ppd. He does admit some occasional "smoker's cough" and some wheezing or cough, but never treated for it. No prior dx COPD or breathing treatments or inhalers. He is interested in quitting but not ready still.  Additional concerns - Had sinus symptoms, in January, tried sudafed with some relief over past weekend stopped taking this, significant improvement with clear sinuses now. He has occasional productive cough. Not tried nasal spray or allergy med  Health Maintenance: Declined TDap, he thinks has  not been >10 years since last vaccine  Due for Flu Shot, declines today despite counseling on benefits   Depression screen [facility] 2/9 02/18/2019 08/28/2018 04/09/2018  Decreased Interest 0 0 0  Down, Depressed, Hopeless 0 0 0  PHQ - 2 Score 0 0 0  Altered sleeping - - 0  Tired, decreased energy - - 0  Change in appetite - - 0  Feeling bad or failure about yourself  - - 0  Trouble concentrating - - 0  Moving slowly or fidgety/restless - - 0  Suicidal thoughts - - 0  PHQ-9 Score - - 0  Difficult doing work/chores - - Not difficult at all    Past Medical History:  Diagnosis Date  . Anxiety   . Bipolar disorder (HCC)   . CIGARETTE SMOKER 02/08/2011   Qualifier: Diagnosis of  By: Laural BenesJohnson MD, [place]    . Seizures (HCC)    Past Surgical History:  Procedure Laterality Date  . WISDOM TOOTH EXTRACTION     Social History   Socioeconomic History  . Marital status: Significant Other    Spouse name: Not on file  . Number of children: 0  . Years of education: Not on file  . Highest education level: Associate degree: occupational, Scientist, product/process developmenttechnical, or vocational program  Occupational History  . Occupation: Location managermachine operator     Comment: [employer]   Social Needs  . Financial resource strain: Very hard  . Food insecurity:    Worry: Often true    Inability: Often true  . Transportation needs:    Medical:  No    Non-medical: No  Tobacco Use  . Smoking status: Current Every Day Smoker    Packs/day: 1.00    Years: 15.00    Pack years: 15.00    Types: Cigarettes  . Smokeless tobacco: Current User  Substance and Sexual Activity  . Alcohol use: Yes    Frequency: Never    Comment: 6 drinks a day  . Drug use: Not Currently    Types: Marijuana, Other-see comments, LSD    Comment: in past  . Sexual activity: Yes  Lifestyle  . Physical activity:    Days per week: 0 days    Minutes per session: 0 min  . Stress: Rather much  Relationships  . Social connections:    Talks on phone: Once a  week    Gets together: Never    Attends religious service: Never    Active member of club or organization: No    Attends meetings of clubs or organizations: Never    Relationship status: Living with partner  . Intimate partner violence:    Fear of current or ex partner: No    Emotionally abused: No    Physically abused: No    Forced sexual activity: No  Other Topics Concern  . Not on file  Social History Narrative  . Not on file   Family History  Problem Relation Age of Onset  . Anxiety disorder Mother   . OCD Brother   . Anxiety disorder Maternal Aunt   . Anxiety disorder Maternal Grandmother   . Anxiety disorder Paternal Grandfather    Current Outpatient Medications on File Prior to Visit  Medication Sig  . benztropine (COGENTIN) 1 MG tablet Take 1 tablet (1 mg total) by mouth 2 (two) times daily. For side effects of risperidone  . divalproex (DEPAKOTE ER) 500 MG 24 hr tablet Take 2 tablets (1,000 mg total) by mouth at bedtime.  . gabapentin (NEURONTIN) 600 MG tablet Take 1 tablet (600 mg total) by mouth 3 (three) times daily.  Marland Kitchen PROAIR HFA 108 (90 Base) MCG/ACT inhaler INHALE 2 PUFFS INTO THE LUNGS EVERY 4 HOURS AS NEEDED FOR WHEEZING OR SHORTNESS OF BREATH (COUGH).  Marland Kitchen risperiDONE (RISPERDAL) 2 MG tablet Take 1 tablet (2 mg total) by mouth at bedtime.   No current facility-administered medications on file prior to visit.     Review of Systems  Constitutional: Negative for activity change, appetite change, chills, diaphoresis, fatigue and fever.  HENT: Positive for postnasal drip. Negative for congestion and hearing loss.   Eyes: Negative for visual disturbance.  Respiratory: Positive for cough. Negative for apnea, choking, chest tightness, shortness of breath and wheezing.   Cardiovascular: Negative for chest pain, palpitations and leg swelling.  Gastrointestinal: Negative for abdominal pain, anal bleeding, blood in stool, constipation, diarrhea, nausea and vomiting.    Endocrine: Negative for cold intolerance.  Genitourinary: Negative for decreased urine volume, dysuria, frequency, hematuria and urgency.  Musculoskeletal: Negative for arthralgias, back pain and neck pain.  Skin: Negative for rash.  Allergic/Immunologic: Negative for environmental allergies.  Neurological: Negative for dizziness, weakness, light-headedness, numbness and headaches.  Hematological: Negative for adenopathy.  Psychiatric/Behavioral: Negative for behavioral problems, dysphoric mood and sleep disturbance. The patient is not nervous/anxious.    Per HPI unless specifically indicated above     Objective:    BP 128/60 (BP Location: Left Arm, Cuff Size: Normal)   Pulse 62   Temp 97.7 F (36.5 C) (Oral)   Resp 16   Ht  6\' 5"  (1.956 m)   Wt 214 lb (97.1 kg)   BMI 25.38 kg/m   Wt Readings from Last 3 Encounters:  02/18/19 214 lb (97.1 kg)  02/18/19 214 lb (97.1 kg)  01/17/19 212 lb 12.8 oz (96.5 kg)    Physical Exam Vitals signs and nursing note reviewed.  Constitutional:      General: He is not in acute distress.    Appearance: He is well-developed. He is not diaphoretic.     Comments: Well-appearing, comfortable, cooperative  HENT:     Head: Normocephalic and atraumatic.     Comments: Frontal / maxillary sinuses non-tender, nares with turbinate edema and congestion without purulence. Bilateral TMs clear without erythema, effusion or bulging. Oropharynx clear without erythema, exudates, edema or asymmetry. Eyes:     General:        Right eye: No discharge.        Left eye: No discharge.     Conjunctiva/sclera: Conjunctivae normal.     Pupils: Pupils are equal, round, and reactive to light.  Neck:     Musculoskeletal: Normal range of motion and neck supple.     Thyroid: No thyromegaly.  Cardiovascular:     Rate and Rhythm: Normal rate and regular rhythm.     Heart sounds: Normal heart sounds. No murmur.  Pulmonary:     Effort: Pulmonary effort is normal. No  respiratory distress.     Breath sounds: No rales.     Comments: Occasional scattered rhonchi vs coarse breath sound, some clear with cough. No focal wheezing. Good air movement overall. Abdominal:     General: Bowel sounds are normal. There is no distension.     Palpations: Abdomen is soft. There is no mass.     Tenderness: There is no abdominal tenderness.  Musculoskeletal: Normal range of motion.        General: No tenderness.     Comments: Upper / Lower Extremities: - Normal muscle tone, strength bilateral upper extremities 5/5, lower extremities 5/5  Lymphadenopathy:     Cervical: No cervical adenopathy.  Skin:    General: Skin is warm and dry.     Findings: No erythema or rash.  Neurological:     Mental Status: He is alert and oriented to person, place, and time.     Comments: Distal sensation intact to light touch all extremities  Psychiatric:        Behavior: Behavior normal.     Comments: Well groomed, good eye contact, normal speech and thoughts      EKG - performed in office today  Date: 02/18/19  Rate: 65  Rhythm: normal sinus rhythm  QRS Axis: normal  Intervals: normal  ST/T Wave abnormalities: normal  Conduction Disutrbances:none  Additional Narrative Interpretation: none  Old EKG Reviewed: improved in comparison from last EKG 11/20/11       Assessment & Plan:   Problem List Items Addressed This Visit    Alcohol use disorder, moderate, dependence (HCC)    Clinically daily alcohol intake with some history of prolonged use, at risk of alcohol dependence / overuse  Reviewed alcohol cessation techniques, recommendations, and future discuss with psych for additional resources, when ready to quit and cut back advised to do safely and coordinate with [place].      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)    Stable, chronic problem, now >11+ years in remission Followed by [facility] Dr Elna Breslow locally now with good results On current med regimen per Psych  Checked EKG due to medication, risk of side effect QTc, will make results available to Psychiatry and offered check Depakote level today but blood already drawn, did not have correct special tube for this, he opted to wait to discuss with Dr Elna Breslow at next visit 4/24 and they will likely draw then      Tobacco abuse    Active smoker, 1ppd x 15 years No prior successful quit attempt Not quite ready to quit, but has some recent worse symptoms with cough       Other Visit Diagnoses    Annual physical exam    -  Primary   Adverse effect of drug, initial encounter       Relevant Orders   EKG 12-Lead   Long-term use of high-risk medication          Updated Health Maintenance information Labs drawn today will notify patient of results in follow-up Encouraged improvement to lifestyle with diet and exercise   No orders of the defined types were placed in this encounter.   Follow up plan: Return in about 6 months (around 08/19/2019) for 6 month follow-up - Psychiatry updates.  Saralyn Pilar, DO [facility] 02/18/2019, 10:20 AM

## 2019-02-18 NOTE — Patient Instructions (Addendum)
Thank you for coming to the office today.  EKG today is normal. Will upload to your chart, and Dr Elna Breslow can review.  For sinus congestion / cough Start OTC nasal steroid Flonase 2 sprays in each nostril daily for 4-6 weeks, may repeat course seasonally or as needed  Did not check Valproic Acid Level today. It is a specialized tube. Please share this with Dr Elna Breslow in future and make sure she will check it if she needs this and other lab test.  Let me know if need any assistance with Alcohol or Tobacco use - as discussed.  If asthma worsening or need to use rescue inhaler too often let me know.  Please schedule a Follow-up Appointment to: Return in about 6 months (around 08/19/2019) for 6 month follow-up - Psychiatry updates.  If you have any other questions or concerns, please feel free to call the office or send a message through MyChart. You may also schedule an earlier appointment if necessary.  Additionally, you may be receiving a survey about your experience at our office within a few days to 1 week by e-mail or mail. We value your feedback.  Saralyn Pilar, DO [facility], [place]

## 2019-02-18 NOTE — Assessment & Plan Note (Signed)
Active smoker, 1ppd x 15 years No prior successful quit attempt Not quite ready to quit, but has some recent worse symptoms with cough

## 2019-02-19 LAB — TSH: TSH: 1.4 mIU/L (ref 0.40–4.50)

## 2019-02-19 LAB — COMPLETE METABOLIC PANEL WITH GFR
AG Ratio: 1.7 (calc) (ref 1.0–2.5)
ALKALINE PHOSPHATASE (APISO): 57 U/L (ref 36–130)
ALT: 13 U/L (ref 9–46)
AST: 16 U/L (ref 10–40)
Albumin: 4.5 g/dL (ref 3.6–5.1)
BUN: 9 mg/dL (ref 7–25)
CO2: 27 mmol/L (ref 20–32)
Calcium: 9.5 mg/dL (ref 8.6–10.3)
Chloride: 101 mmol/L (ref 98–110)
Creat: 0.78 mg/dL (ref 0.60–1.35)
GFR, Est African American: 136 mL/min/{1.73_m2} (ref >=60)
GFR, Est Non African American: 118 mL/min/{1.73_m2} (ref >=60)
Globulin: 2.7 g/dL (calc) (ref 1.9–3.7)
Glucose, Bld: 84 mg/dL (ref 65–99)
Potassium: 4.5 mmol/L (ref 3.5–5.3)
Sodium: 138 mmol/L (ref 135–146)
Total Bilirubin: 0.5 mg/dL (ref 0.2–1.2)
Total Protein: 7.2 g/dL (ref 6.1–8.1)

## 2019-02-19 LAB — LIPID PANEL
Cholesterol: 169 mg/dL (ref <200)
HDL: 71 mg/dL (ref >=40)
LDL Cholesterol (Calc): 83 mg/dL (calc)
Non-HDL Cholesterol (Calc): 98 mg/dL (calc) (ref <130)
Total CHOL/HDL Ratio: 2.4 (calc) (ref <5.0)
Triglycerides: 74 mg/dL (ref <150)

## 2019-02-19 LAB — HEMOGLOBIN A1C
Hgb A1c MFr Bld: 5.1 % of total Hgb (ref <5.7)
Mean Plasma Glucose: 100 (calc)
eAG (mmol/L): 5.5 (calc)

## 2019-02-19 LAB — CBC WITH DIFFERENTIAL/PLATELET
Absolute Monocytes: 587 cells/uL (ref 200–950)
Basophils Absolute: 53 cells/uL (ref 0–200)
Basophils Relative: 0.8 %
Eosinophils Absolute: 244 cells/uL (ref 15–500)
Eosinophils Relative: 3.7 %
HCT: 43.9 % (ref 38.5–50.0)
Hemoglobin: 15.5 g/dL (ref 13.2–17.1)
Lymphs Abs: 2237 cells/uL (ref 850–3900)
MCH: 33.3 pg — ABNORMAL HIGH (ref 27.0–33.0)
MCHC: 35.3 g/dL (ref 32.0–36.0)
MCV: 94.4 fL (ref 80.0–100.0)
MPV: 9.6 fL (ref 7.5–12.5)
Monocytes Relative: 8.9 %
Neutro Abs: 3478 cells/uL (ref 1500–7800)
Neutrophils Relative %: 52.7 %
PLATELETS: 331 10*3/uL (ref 140–400)
RBC: 4.65 10*6/uL (ref 4.20–5.80)
RDW: 12.6 % (ref 11.0–15.0)
Total Lymphocyte: 33.9 %
WBC: 6.6 10*3/uL (ref 3.8–10.8)

## 2019-02-19 LAB — T4, FREE: Free T4: 1.2 ng/dL (ref 0.8–1.8)

## 2019-03-02 ENCOUNTER — Other Ambulatory Visit: Payer: Self-pay | Admitting: Psychiatry

## 2019-03-02 DIAGNOSIS — F200 Paranoid schizophrenia: Secondary | ICD-10-CM

## 2019-03-24 ENCOUNTER — Other Ambulatory Visit: Payer: Self-pay | Admitting: Psychiatry

## 2019-03-24 DIAGNOSIS — F411 Generalized anxiety disorder: Secondary | ICD-10-CM

## 2019-03-24 DIAGNOSIS — F1020 Alcohol dependence, uncomplicated: Secondary | ICD-10-CM

## 2019-04-18 ENCOUNTER — Ambulatory Visit: Payer: Medicare Other | Admitting: Psychiatry

## 2019-04-19 ENCOUNTER — Other Ambulatory Visit: Payer: Self-pay | Admitting: Psychiatry

## 2019-04-19 DIAGNOSIS — F200 Paranoid schizophrenia: Secondary | ICD-10-CM

## 2019-05-02 ENCOUNTER — Other Ambulatory Visit: Payer: Self-pay

## 2019-05-02 ENCOUNTER — Encounter: Payer: Self-pay | Admitting: Psychiatry

## 2019-05-02 ENCOUNTER — Ambulatory Visit (INDEPENDENT_AMBULATORY_CARE_PROVIDER_SITE_OTHER): Payer: Medicare Other | Admitting: Psychiatry

## 2019-05-02 DIAGNOSIS — F411 Generalized anxiety disorder: Secondary | ICD-10-CM | POA: Diagnosis not present

## 2019-05-02 DIAGNOSIS — F1010 Alcohol abuse, uncomplicated: Secondary | ICD-10-CM

## 2019-05-02 DIAGNOSIS — F200 Paranoid schizophrenia: Secondary | ICD-10-CM

## 2019-05-02 DIAGNOSIS — F17200 Nicotine dependence, unspecified, uncomplicated: Secondary | ICD-10-CM | POA: Diagnosis not present

## 2019-05-02 DIAGNOSIS — F1211 Cannabis abuse, in remission: Secondary | ICD-10-CM

## 2019-05-02 MED ORDER — RISPERIDONE 2 MG PO TABS: 2.0000 mg | ORAL_TABLET | Freq: Every day | ORAL | 1 refills | Status: DC

## 2019-05-02 MED ORDER — DIVALPROEX SODIUM ER 500 MG PO TB24: 1000.0000 mg | ORAL_TABLET | Freq: Every day | ORAL | 1 refills | Status: DC

## 2019-05-02 NOTE — Progress Notes (Signed)
Virtual Visit via Video Note  I connected with Rodney [name] on 05/02/19 at 11:30 AM EDT by a video enabled telemedicine application and verified that I am speaking with the correct person using two identifiers.   I discussed the limitations of evaluation and management by telemedicine and the availability of in person appointments. The patient expressed understanding and agreed to proceed.   I discussed the assessment and treatment plan with the patient. The patient was provided an opportunity to ask questions and all were answered. The patient agreed with the plan and demonstrated an understanding of the instructions.   The patient was advised to call back or seek an in-person evaluation if the symptoms worsen or if the condition fails to improve as anticipated.     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  Attempted to evaluate patient through video consult however due to connection problem the consult had to be changed to a phone call.  Patient today reports he is currently busy with his work.  He is currently working full-time with [employer] job that he used to do part-time previously.  He reports work is going well.  He is compliant on his medications as prescribed.  He denies any side effects to the medications.  He reports sleep is good.  He continues to take melatonin as needed.  He reports his family is doing okay and they are all staying safe from the COVID-19.  Patient reports he continues to smoke cigarettes.  He however is not ready to quit yet.     Visit Diagnosis:    ICD-10-CM   1. Paranoid  schizophrenia (HCC) F20.0 divalproex (DEPAKOTE ER) 500 MG 24 hr tablet  2. GAD (generalized anxiety disorder) F41.1 risperiDONE (RISPERDAL) 2 MG tablet   in remission  3. Alcohol use disorder, mild, abuse F10.10   4. Tobacco use disorder F17.200   5. 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  Past Medical History:  Past Medical History:  Diagnosis Date  . Anxiety   . Bipolar disorder (HCC)   . CIGARETTE SMOKER 02/08/2011   Qualifier: Diagnosis of  By: Laural Benes MD, [place]    . Seizures (HCC)     Past Surgical History:  Procedure Laterality Date  . WISDOM TOOTH EXTRACTION      Family Psychiatric History: I have reviewed family psychiatric history from my progress note on 09/23/2018  Family History:  Family History  Problem Relation Age of Onset  . Anxiety disorder Mother   . OCD Brother   . Anxiety disorder Maternal Aunt   . Anxiety disorder Maternal Grandmother   . Anxiety disorder Paternal Grandfather     Social History: Reviewed social history from my progress note on 09/23/2018 Social History   Socioeconomic History  . Marital status: Significant Other    Spouse name: Not on file  . Number of children: 0  . Years of education: Not on file  . Highest education level: Associate degree: occupational, Scientist, product/process development, or vocational program  Occupational  History  . Occupation: Location manager     Comment: [employer]   Social Needs  . Financial resource strain: Very hard  . Food insecurity:    Worry: Often true    Inability: Often true  . Transportation needs:    Medical: No    Non-medical: No  Tobacco Use  . Smoking status: Current Every Day Smoker    Packs/day: 1.00    Years: 15.00    Pack years: 15.00    Types: Cigarettes  . Smokeless tobacco: Current User  Substance and Sexual Activity  . Alcohol use: Yes    Frequency: Never    Comment: 6  drinks a day  . Drug use: Not Currently    Types: Marijuana, Other-see comments, LSD    Comment: in past  . Sexual activity: Yes  Lifestyle  . Physical activity:    Days per week: 0 days    Minutes per session: 0 min  . Stress: Rather much  Relationships  . Social connections:    Talks on phone: Once a week    Gets together: Never    Attends religious service: Never    Active member of club or organization: No    Attends meetings of clubs or organizations: Never    Relationship status: Living with partner  Other Topics Concern  . Not on file  Social History Narrative  . Not on file    Allergies:  Allergies  Allergen Reactions  . Hydroxyzine Hcl Rash    Metabolic Disorder Labs: Lab Results  Component Value Date   HGBA1C 5.1 02/18/2019   MPG 100 02/18/2019   MPG 103 03/11/2018   No results found for: PROLACTIN Lab Results  Component Value Date   CHOL 169 02/18/2019   TRIG 74 02/18/2019   HDL 71 02/18/2019   CHOLHDL 2.4 02/18/2019   LDLCALC 83 02/18/2019   Lab Results  Component Value Date   TSH 1.40 02/18/2019    Therapeutic Level Labs: No results found for: LITHIUM Lab Results  Component Value Date   VALPROATE 77 11/01/2018   No components found for:  CBMZ  Current Medications: Current Outpatient Medications  Medication Sig Dispense Refill  . benztropine (COGENTIN) 1 MG tablet TAKE 1 TABLET (1 MG TOTAL) BY MOUTH 2 (TWO) TIMES DAILY. FOR SIDE EFFECTS OF RISPERIDONE 180 tablet 1  . divalproex (DEPAKOTE ER) 500 MG 24 hr tablet Take 2 tablets (1,000 mg total) by mouth at bedtime. 180 tablet 1  . gabapentin (NEURONTIN) 600 MG tablet TAKE 1 TABLET BY MOUTH THREE TIMES A DAY 270 tablet 1  . PROAIR HFA 108 (90 Base) MCG/ACT inhaler INHALE 2 PUFFS INTO THE LUNGS EVERY 4 HOURS AS NEEDED FOR WHEEZING OR SHORTNESS OF BREATH (COUGH). 8.5 Inhaler 2  . risperiDONE (RISPERDAL) 2 MG tablet Take 1 tablet (2 mg total) by mouth at bedtime. 90 tablet 1   No current  facility-administered medications for this visit.      Musculoskeletal: Strength & Muscle Tone: UTA Gait & Station: UTA Patient leans: N/A  Psychiatric Specialty Exam: Review of Systems  Psychiatric/Behavioral: The patient is nervous/anxious.   All other systems reviewed and are negative.   There were no vitals taken for this visit.There is no height or weight on file to calculate BMI.  General Appearance: UTA  Eye Contact:  UTA  Speech:  Normal Rate  Volume:  Normal  Mood:  Anxious coping ok  Affect:  UTA  Thought Process:  Goal Directed and Descriptions of  Associations: Intact  Orientation:  Full (Time, Place, and Person)  Thought Content: Logical   Suicidal Thoughts:  No  Homicidal Thoughts:  No  Memory:  Immediate;   Fair Recent;   Fair Remote;   Fair  Judgement:  Fair  Insight:  Fair  Psychomotor Activity:  UTA  Concentration:  Concentration: Fair and Attention Span: Fair  Recall:  FiservFair  Fund of Knowledge: Fair  Language: Fair  Akathisia:  No  Handed:  Right  AIMS (if indicated): denies tremors, rigidity,stiffness  Assets:  Communication Skills Desire for Improvement Housing Social Support  ADL's:  Intact  Cognition: WNL  Sleep:  Fair   Screenings: AIMS     [facility]23/[facility]  AIMS Total Score  3    PHQ2-9     Clinical Support from 02/18/2019 in [facility] Office Visit from 08/28/2018 in [facility] Office Visit from 04/09/2018 [facility]  PHQ-2 Total Score  0  0  0  0  PHQ-9 Total Score  -  -  0  -       Assessment and Plan: Rodney [name] is a 33 year old Caucasian male, lives in [place], married, has a history of schizophrenia in remission, anxiety disorder, alcohol, cannabis abuse in remission, tobacco use disorder was evaluated by telemedicine today.  A video consult was attempted.  However due to  network problems the consult had to be changed to a phone call.  Patient is currently doing well on the current medication regimen and reports mood is stable.  Plan as noted below.  Plan Schizophrenia in remission Risperidone 2 mg p.o. nightly Cogentin 1 mg p.o. twice daily for EPS Depakote ER 1000 mg p.o. daily Depakote level therapeutic on 11/21/2018-77. Discussed with patient that we will get another Depakote level however wait for COVID-19 restrictions to come down.  GAD-improving Gabapentin 600 mg p.o. 3 times daily Continue psychotherapy.  Insomnia-improving Melatonin 3 mg p.o. nightly as needed  For tobacco use disorder-unstable Provided smoking cessation counseling.  Follow-up in clinic in 3 months or sooner if needed.  Appointment scheduled for August 7 at 10:45 AM.  I have spent atleast 15 minutes non face to face with patient today. More than 50 % of the time was spent for psychoeducation and supportive psychotherapy and care coordination.  This note was generated in part or whole with voice recognition software. Voice recognition is usually quite accurate but there are transcription errors that can and very often do occur. I apologize for any typographical errors that were not detected and corrected.       Jomarie LongsSaramma Lanore Renderos, MD 05/02/2019, 1:22 PM

## 2019-08-01 ENCOUNTER — Ambulatory Visit: Payer: Medicare Other | Admitting: Psychiatry

## 2019-08-06 ENCOUNTER — Other Ambulatory Visit: Payer: Self-pay

## 2019-08-06 DIAGNOSIS — Z20822 Contact with and (suspected) exposure to covid-19: Secondary | ICD-10-CM

## 2019-08-08 LAB — NOVEL CORONAVIRUS, NAA: SARS-CoV-2, NAA: NOT DETECTED

## 2019-08-29 ENCOUNTER — Other Ambulatory Visit: Payer: Self-pay

## 2019-08-29 ENCOUNTER — Encounter: Payer: Self-pay | Admitting: Family Medicine

## 2019-08-29 ENCOUNTER — Other Ambulatory Visit: Payer: Self-pay | Admitting: Family Medicine

## 2019-08-29 ENCOUNTER — Ambulatory Visit (INDEPENDENT_AMBULATORY_CARE_PROVIDER_SITE_OTHER): Payer: Medicare Other | Admitting: Family Medicine

## 2019-08-29 VITALS — BP 120/68 | HR 65 | Temp 98.3°F | Resp 16 | Ht 77.0 in | Wt 214.0 lb

## 2019-08-29 DIAGNOSIS — L84 Corns and callosities: Secondary | ICD-10-CM | POA: Diagnosis not present

## 2019-08-29 DIAGNOSIS — Z720 Tobacco use: Secondary | ICD-10-CM | POA: Diagnosis not present

## 2019-08-29 DIAGNOSIS — Z0000 Encounter for general adult medical examination without abnormal findings: Secondary | ICD-10-CM

## 2019-08-29 DIAGNOSIS — J449 Chronic obstructive pulmonary disease, unspecified: Secondary | ICD-10-CM | POA: Diagnosis not present

## 2019-08-29 DIAGNOSIS — F200 Paranoid schizophrenia: Secondary | ICD-10-CM

## 2019-08-29 DIAGNOSIS — Z79899 Other long term (current) drug therapy: Secondary | ICD-10-CM

## 2019-08-29 MED ORDER — BUDESONIDE-FORMOTEROL FUMARATE 80-4.5 MCG/ACT IN AERO: 2.0000 | INHALATION_SPRAY | Freq: Two times a day (BID) | RESPIRATORY_TRACT | 0 refills | Status: DC

## 2019-08-29 NOTE — Progress Notes (Signed)
Subjective:    Patient ID: Rodney [name], male    DOB: 01-22-1986, 33 y.o.   MRN: [id number]  Rodney [name] is a 33 y.o. male presenting on 08/29/2019 for Cough (as per patient has chronic ongoing cough worked with someone had positive test but hisd result were negative denies fever but has SOB from smoking)   HPI  Chronic Cough vs Asthma / COPD / Tobacco Abuse Chronic history of active smoker, >15 years 1ppd. He does admit chronic recurrent "smoker's cough" and some wheezing or cough. In past he was treated with Albuterol with good results, but using only PRN as advised, it does help however, No prior dx COPD or breathing treatments or inhalers. He is interested in quitting but not ready still. - He thinks he may have had a PFT pulm test many years ago but does not remember - He admits coworker positive recently few weeks ago but he was tested after potential exposure and COVID19 test negative for him. No new symptoms - He admits some tightness or wheezing at times with his breathing, worse at night or exertion sometimes. - Denies any fevers chills bodyache, significant productive cough  Callus / Corns Bilateral Feet Reports persistent issue for months with worsening now, admits due to footwear at work and frequent standing on concrete. He says callus sore and painful improved with corn cream OTC and treatment but still has area on both great toes. Mild tender. He is asking about possibility of plantar wart.   Health Maintenance: Due for Flu Shot, declines today despite counseling on benefits   Depression screen [facility] 2/9 08/29/2019 02/18/2019 08/28/2018  Decreased Interest 0 0 0  Down, Depressed, Hopeless 0 0 0  PHQ - 2 Score 0 0 0  Altered sleeping 0 - -  Tired, decreased energy 0 - -  Change in appetite 0 - -  Feeling bad or failure about yourself  0 - -  Trouble concentrating 0 - -  Moving slowly or fidgety/restless 0 - -  Suicidal thoughts 0 - -  PHQ-9 Score 0 - -  Difficult doing  work/chores Not difficult at all - -   GAD 7 : Generalized Anxiety Score 08/29/2019  Nervous, Anxious, on Edge 1  Control/stop worrying 0  Worry too much - different things 0  Trouble relaxing 0  Restless 0  Easily annoyed or irritable 1  Afraid - awful might happen 0  Total GAD 7 Score 2  Anxiety Difficulty Not difficult at all     Social History   Tobacco Use  . Smoking status: Current Every Day Smoker    Packs/day: 1.00    Years: 15.00    Pack years: 15.00    Types: Cigarettes  . Smokeless tobacco: Current User  Substance Use Topics  . Alcohol use: Yes    Frequency: Never    Comment: 6 drinks a day  . Drug use: Not Currently    Types: Marijuana, Other-see comments, LSD    Comment: in past    Review of Systems Per HPI unless specifically indicated above     Objective:    BP 120/68   Pulse 65   Temp 98.3 F (36.8 C) (Oral)   Resp 16   Ht 6\' 5"  (1.956 m)   Wt 214 lb (97.1 kg)   BMI 25.38 kg/m   Wt Readings from Last 3 Encounters:  08/29/19 214 lb (97.1 kg)  02/18/19 214 lb (97.1 kg)  02/18/19 214 lb (97.1 kg)  Physical Exam Vitals signs and nursing note reviewed.  Constitutional:      General: He is not in acute distress.    Appearance: He is well-developed. He is not diaphoretic.     Comments: Well-appearing, comfortable, cooperative  HENT:     Head: Normocephalic and atraumatic.  Eyes:     General:        Right eye: No discharge.        Left eye: No discharge.     Conjunctiva/sclera: Conjunctivae normal.  Cardiovascular:     Rate and Rhythm: Normal rate.  Pulmonary:     Effort: Pulmonary effort is normal.     Breath sounds: Wheezing (diffuse end exp wheezing, mild) present. No rhonchi or rales.     Comments: Some reduced air movement No cough Skin:    General: Skin is warm and dry.     Findings: No erythema or rash.     Comments: Bilateral great toe callus thick with corn palpable, minimal tender with some slight break in the callus but no  evidence of plantar wart  Neurological:     Mental Status: He is alert and oriented to person, place, and time.  Psychiatric:        Behavior: Behavior normal.     Comments: Well groomed, good eye contact, normal speech and thoughts    Results for orders placed or performed in visit on 08/06/19  Novel Coronavirus, NAA ([facility])   Specimen: Oropharyngeal(OP) collection in vial transport medium   OROPHARYNGEA  TESTING  Result Value Ref Range   SARS-CoV-2, NAA Not Detected Not Detected      Assessment & Plan:   Problem List Items Addressed This Visit    Tobacco abuse Encourage smoking cessation    Other Visit Diagnoses    Asthma with COPD (HCC)    -  Primary  Clinically with some chronic respiratory dysfunction, seems mixture of asthma vs COPD, active smoker >15 years likely contributing, no formal diagnosis previously - No trial of maintenance therapy  Plan - Trial sample of Symbicort for 2 week duration, 1-2 puffs BID 80mg  lower dose for maintenance therapy to see if improved lung function, he can check cost coverage of other maintenance inhalers, listed on AVS, will order rx if improved - Referral to [facility] for further diagnostic evaluation, and PFTs    Relevant Medications   budesonide-formoterol (SYMBICORT) 80-4.5 MCG/ACT inhaler   Other Relevant Orders   Ambulatory referral to Pulmonology   Corns of multiple toes         Consistent with callus vs corn formation on key impact areas / friction spots on great toes, without evidence of complication. No secondary infection or neuropathy. Not consistent with plantar wart - recommend OTC therapy and home management , gradually improving - can refer to podiatry in future if indicated  #Psychiatry updates - Continue w/ Dr Elna Breslow, will review upcoming chart, patient is clinically stable and well controlled, he is asking in future if we can transition his medications to our office, will defer decision to Dr Elna Breslow,  and seek advice if needed in future, he would benefit from psychotherapy most likely    Orders Placed This Encounter  Procedures  . Ambulatory referral to Pulmonology    Referral Priority:   Routine    Referral Type:   Consultation    Referral Reason:   Specialty Services Required    Requested Specialty:   Pulmonary Disease    Number of Visits Requested:  1    Meds ordered this encounter  Medications  . budesonide-formoterol (SYMBICORT) 80-4.5 MCG/ACT inhaler    Sig: Inhale 2 puffs into the lungs 2 (two) times daily.    Dispense:  1 Inhaler    Refill:  0    Follow up plan: Return in about 6 months (around 02/26/2020) for Annual Physical.  Future labs ordered for 02/2020  Saralyn PilarAlexander Carrell Palmatier, DO [facility] 08/29/2019, 2:00 PM

## 2019-08-29 NOTE — Patient Instructions (Addendum)
Thank you for coming to the office today.  Referral sent to Lung doctors for possible ASTHMA vs COPD, and may need breathing test pulmonary function test  [facility] [address] [place], [place] Phone: [phone]  Start symbicort sample, daily inhaler - can use TWICE a day - start 1 puff then can increase to 2 each dose.  Call or send message after 1-2 weeks on symbicort if helpful and need new rx sent.  Also can ask on cost / coverage of the following:  - Advair - Breo  - Anoro - Incruse - Spiriva  Use albuterol as needed.  Try to limit smoking  Feet look like corns / callus, can try epsom salt soak and emory board / pumice stone, may need podiatry to treat it more. Will likely take time. Keep using OTC treatments.  Continue to follow-up w/ Dr Shea Evans  Please schedule a Follow-up Appointment to: Return in about 6 months (around 02/26/2020) for Annual Physical.  If you have any other questions or concerns, please feel free to call the office or send a message through [place]. You may also schedule an earlier appointment if necessary.  Additionally, you may be receiving a survey about your experience at our office within a few days to 1 week by e-mail or mail. We value your feedback.  Nobie Putnam, DO [place]

## 2019-09-02 ENCOUNTER — Encounter: Payer: Self-pay | Admitting: Psychiatry

## 2019-09-02 ENCOUNTER — Other Ambulatory Visit: Payer: Self-pay

## 2019-09-02 ENCOUNTER — Ambulatory Visit (INDEPENDENT_AMBULATORY_CARE_PROVIDER_SITE_OTHER): Payer: Medicare Other | Admitting: Psychiatry

## 2019-09-02 DIAGNOSIS — F17200 Nicotine dependence, unspecified, uncomplicated: Secondary | ICD-10-CM | POA: Diagnosis not present

## 2019-09-02 DIAGNOSIS — F1010 Alcohol abuse, uncomplicated: Secondary | ICD-10-CM | POA: Diagnosis not present

## 2019-09-02 DIAGNOSIS — F209 Schizophrenia, unspecified: Secondary | ICD-10-CM | POA: Diagnosis not present

## 2019-09-02 DIAGNOSIS — F1211 Cannabis abuse, in remission: Secondary | ICD-10-CM

## 2019-09-02 DIAGNOSIS — F411 Generalized anxiety disorder: Secondary | ICD-10-CM | POA: Insufficient documentation

## 2019-09-02 NOTE — Progress Notes (Signed)
Virtual Visit via Video Note  I connected with Rodney [name] on 09/02/19 at 10:45 AM EDT by a video enabled telemedicine application and verified that I am speaking with the correct person using two identifiers.   I discussed the limitations of evaluation and management by telemedicine and the availability of in person appointments. The patient expressed understanding and agreed to proceed.   I discussed the assessment and treatment plan with the patient. The patient was provided an opportunity to ask questions and all were answered. The patient agreed with the plan and demonstrated an understanding of the instructions.   The patient was advised to call back or seek an in-person evaluation if the symptoms worsen or if the condition fails to improve as anticipated.   BH MD OP Progress Note  09/02/2019 11:14 AM Rodney [name]  MRN:  [id number]  Chief Complaint:  Chief Complaint    Follow-up     HPI: Rodney [name] is a 33 year old Caucasian male, employed, lives in [place], is married, has a history of schizophrenia currently in remission, generalized anxiety disorder, alcohol use disorder, cannabis use disorder in remission, tobacco use disorder was evaluated by telemedicine today.  A video call was initiated however due to connection problem it had to be changed to a phone call.  Patient today reports he is currently doing well with regards to his mood symptoms.  He denies any significant mood lability, anxiety or depressive symptoms.  He denies any perceptual disturbances.  He reports sleep and appetite is fair.  He is compliant on his medications as prescribed.  He denies side effects.  He reports he is currently working on cutting back on his smoking.  His primary care provider is supporting him on the same.  Patient reports he continues to use alcohol on and off however denies any binging or having any withdrawal symptoms from alcohol.  Patient denies any suicidality, homicidality.  Patient  reports he would like his care to be transitioned back to his primary care provider and he had this discussion with his provider recently who is agreeable with the same.  Discussed with patient about getting Depakote labs done since it was done over a year ago.  Patient agrees with plan.  He reports he will get it with his PMD.   Visit Diagnosis:    ICD-10-CM   1. Schizophrenia in remission (HCC)  F20.9 Valproic acid level  2. GAD (generalized anxiety disorder)  F41.1   3. Alcohol use disorder, mild, abuse  F10.10    improving  4. Tobacco use disorder  F17.200   5. Cannabis use disorder, mild, in sustained remission  F12.11     Past Psychiatric History: I have reviewed past psychiatric history from my progress note on 09/23/2018  Past Medical History:  Past Medical History:  Diagnosis Date  . Anxiety   . Bipolar disorder (HCC)   . CIGARETTE SMOKER 02/08/2011   Qualifier: Diagnosis of  By: Laural Benes MD, [place]    . Seizures (HCC)     Past Surgical History:  Procedure Laterality Date  . WISDOM TOOTH EXTRACTION      Family Psychiatric History: I have reviewed family psychiatric history from my progress note on 09/23/2089 Family History:  Family History  Problem Relation Age of Onset  . Anxiety disorder Mother   . OCD Brother   . Anxiety disorder Maternal Aunt   . Anxiety disorder Maternal Grandmother   . Anxiety disorder Paternal Grandfather     Social History: I have  reviewed social history from my progress note on 09/23/2018 Social History   Socioeconomic History  . Marital status: Significant Other    Spouse name: Not on file  . Number of children: 0  . Years of education: Not on file  . Highest education level: Associate degree: occupational, Scientist, product/process developmenttechnical, or vocational program  Occupational History  . Occupation: Location managermachine operator     Comment: [employer]   Social Needs  . Financial resource strain: Very hard  . Food insecurity    Worry: Often true    Inability:  Often true  . Transportation needs    Medical: No    Non-medical: No  Tobacco Use  . Smoking status: Current Every Day Smoker    Packs/day: 1.00    Years: 15.00    Pack years: 15.00    Types: Cigarettes  . Smokeless tobacco: Current User  Substance and Sexual Activity  . Alcohol use: Yes    Frequency: Never    Comment: 6 drinks a day  . Drug use: Not Currently    Types: Marijuana, Other-see comments, LSD    Comment: in past  . Sexual activity: Yes  Lifestyle  . Physical activity    Days per week: 0 days    Minutes per session: 0 min  . Stress: Rather much  Relationships  . Social Musicianconnections    Talks on phone: Once a week    Gets together: Never    Attends religious service: Never    Active member of club or organization: No    Attends meetings of clubs or organizations: Never    Relationship status: Living with partner  Other Topics Concern  . Not on file  Social History Narrative  . Not on file    Allergies:  Allergies  Allergen Reactions  . Hydroxyzine Hcl Rash    Metabolic Disorder Labs: Lab Results  Component Value Date   HGBA1C 5.1 02/18/2019   MPG 100 02/18/2019   MPG 103 03/11/2018   No results found for: PROLACTIN Lab Results  Component Value Date   CHOL 169 02/18/2019   TRIG 74 02/18/2019   HDL 71 02/18/2019   CHOLHDL 2.4 02/18/2019   LDLCALC 83 02/18/2019   Lab Results  Component Value Date   TSH 1.40 02/18/2019    Therapeutic Level Labs: No results found for: LITHIUM Lab Results  Component Value Date   VALPROATE 77 11/01/2018   No components found for:  CBMZ  Current Medications: Current Outpatient Medications  Medication Sig Dispense Refill  . benztropine (COGENTIN) 1 MG tablet TAKE 1 TABLET (1 MG TOTAL) BY MOUTH 2 (TWO) TIMES DAILY. FOR SIDE EFFECTS OF RISPERIDONE 180 tablet 1  . budesonide-formoterol (SYMBICORT) 80-4.5 MCG/ACT inhaler Inhale 2 puffs into the lungs 2 (two) times daily. 1 Inhaler 0  . divalproex (DEPAKOTE ER)  500 MG 24 hr tablet Take 2 tablets (1,000 mg total) by mouth at bedtime. 180 tablet 1  . gabapentin (NEURONTIN) 600 MG tablet TAKE 1 TABLET BY MOUTH THREE TIMES A DAY 270 tablet 1  . PROAIR HFA 108 (90 Base) MCG/ACT inhaler INHALE 2 PUFFS INTO THE LUNGS EVERY 4 HOURS AS NEEDED FOR WHEEZING OR SHORTNESS OF BREATH (COUGH). 8.5 Inhaler 2  . risperiDONE (RISPERDAL) 2 MG tablet Take 1 tablet (2 mg total) by mouth at bedtime. 90 tablet 1   No current facility-administered medications for this visit.      Musculoskeletal: Strength & Muscle Tone: UTA Gait & Station: Reports as WNL Patient leans: N/A  Psychiatric Specialty Exam: Review of Systems  Psychiatric/Behavioral: Negative for depression, hallucinations and substance abuse. The patient is not nervous/anxious.   All other systems reviewed and are negative.   There were no vitals taken for this visit.There is no height or weight on file to calculate BMI.  General Appearance: UTA  Eye Contact:  UTA  Speech:  Clear and Coherent  Volume:  Normal  Mood:  Euthymic  Affect:  UTA  Thought Process:  Goal Directed and Descriptions of Associations: Intact  Orientation:  Full (Time, Place, and Person)  Thought Content: Logical   Suicidal Thoughts:  No  Homicidal Thoughts:  No  Memory:  Immediate;   Fair Recent;   Fair Remote;   Fair  Judgement:  Fair  Insight:  Fair  Psychomotor Activity:  UTA  Concentration:  Concentration: Fair and Attention Span: Fair  Recall:  [employer] of Knowledge: Fair  Language: Fair  Akathisia:  No  Handed:  Right  AIMS (if indicated): Denies tremors, rigidity  Assets:  Communication Skills Desire for Improvement Housing Social Support  ADL's:  Intact  Cognition: WNL  Sleep:  Fair   Screenings: AIMS     Office Visit from 10/23/2018 in [facility] Total Score  3    GAD-7     Office Visit from 08/29/2019 in [facility]  Total GAD-7 Score  2     PHQ2-9     Office Visit from 08/29/2019 in [facility] from 02/18/2019 in [facility] Office Visit from 08/28/2018 in [facility] Office Visit from 04/09/2018 in [facility] Office Visit from 02/04/2018 in [facility]  PHQ-2 Total Score  0  0  0  0  0  PHQ-9 Total Score  0  -  -  0  -       Assessment and Plan: Anik is a 33 year old Caucasian male, lives in [place], has a history of schizophrenia in remission, anxiety disorder, alcohol, cannabis abuse in remission, tobacco use disorder was evaluated by telemedicine today.  Patient is currently doing well on the current medication regimen.  We will continue plan as noted below.  Plan Schizophrenia in remission Risperidone 2 mg p.o. nightly Cogentin 1 mg p.o. twice daily for EPS Depakote ER 1000 mg p.o. daily Depakote level-therapeutic on 11/21/2018-77. Will order Depakote labs today.  GAD-improving Gabapentin 600 mg p.o. 3 times daily Continue psychotherapy sessions as needed  Insomnia-stable Melatonin 3 mg p.o. nightly  For tobacco use disorder- unstable Provided smoking cessation counseling.  Follow-up in clinic as needed.  Patient wants his care to be transitioned to his primary care provider.  We will sent today's notes to his PMD.  Will recommend getting a Depakote level done soon.  Patient reports he will have this discussion with his primary care provider.  I have spent atleast 15 minutes non face to face with patient today. More than 50 % of the time was spent for psychoeducation and supportive psychotherapy and care coordination. This note was generated in part or whole with voice recognition software. Voice recognition is usually quite accurate but there are transcription errors that can and very often do occur. I apologize for any typographical errors that were not detected and corrected.     Ursula Alert, MD 09/02/2019, 11:14  AM

## 2019-09-10 ENCOUNTER — Institutional Professional Consult (permissible substitution): Payer: Self-pay | Admitting: Pulmonary Disease

## 2019-09-10 ENCOUNTER — Ambulatory Visit (INDEPENDENT_AMBULATORY_CARE_PROVIDER_SITE_OTHER): Payer: Medicare Other | Admitting: Pulmonary Disease

## 2019-09-10 ENCOUNTER — Other Ambulatory Visit: Payer: Self-pay

## 2019-09-10 ENCOUNTER — Encounter: Payer: Self-pay | Admitting: Pulmonary Disease

## 2019-09-10 VITALS — BP 128/74 | HR 72 | Temp 98.2°F | Ht 77.0 in | Wt 216.0 lb

## 2019-09-10 DIAGNOSIS — J302 Other seasonal allergic rhinitis: Secondary | ICD-10-CM

## 2019-09-10 DIAGNOSIS — J3089 Other allergic rhinitis: Secondary | ICD-10-CM

## 2019-09-10 DIAGNOSIS — J45909 Unspecified asthma, uncomplicated: Secondary | ICD-10-CM | POA: Diagnosis not present

## 2019-09-10 DIAGNOSIS — F17210 Nicotine dependence, cigarettes, uncomplicated: Secondary | ICD-10-CM | POA: Diagnosis not present

## 2019-09-10 MED ORDER — BREO ELLIPTA 200-25 MCG/INH IN AEPB: 1.0000 | INHALATION_SPRAY | Freq: Every day | RESPIRATORY_TRACT | 6 refills | Status: DC

## 2019-09-10 MED ORDER — BREO ELLIPTA 200-25 MCG/INH IN AEPB: 1.0000 | INHALATION_SPRAY | Freq: Every day | RESPIRATORY_TRACT | 0 refills | Status: AC

## 2019-09-10 MED ORDER — AZELASTINE-FLUTICASONE 137-50 MCG/ACT NA SUSP: 1.0000 | Freq: Two times a day (BID) | NASAL | 3 refills | Status: DC

## 2019-09-10 NOTE — Patient Instructions (Signed)
1.  We will be getting breathing tests scheduled  2.  We will be switching your Symbicort to Breo Ellipta 200/25, 1 inhalation daily.  Rinse your mouth well after use.  3.  You can still use your rescue inhaler 3-4 times a day as needed for shortness of breath.  4.  For your nasal congestion we have prescribed Dymista 1 spray to each nostril twice a day  5.  We will see you in follow-up in 3 to 4 weeks time.  Call sooner should any new difficulties arise  6.  We highly recommend that you discontinue smoking

## 2019-09-10 NOTE — Progress Notes (Signed)
Subjective:    Patient ID: Rodney [name], male    DOB: 1986/08/29, 33 y.o.   MRN: [id number]  HPI Rodney [name] is a 33 year old current smoker (1 PPD) who presents for evaluation of "problems breathing through his nose" and "rattling" in his lungs when he breathes.  He is kindly referred by Dr. Nobie Putnam.  Patient notes also dyspnea on exertion.  He has not had a prior diagnosis of asthma per se and has only been on albuterol as needed for 3 years or so.  He has noted that his dyspnea is also accompanied by a cough productive of whitish sputum that he has had for "a while".  He believes he was first diagnosed with asthma approximately 5 years ago.  He has noted significant nasal congestion for 6 months to 1 year now.  He does note that he has sensitivities to cat dander and to certain pollens.  He does get seasonal variation with his nasal congestion but as noted over the last 6 months to a year has been mostly perennial.  The patient has been a smoker since age 33 smokes 1 pack of cigarettes per day he did have one episode of hiatus of 7 months but relapsed.  He has not had any fevers, chills or sweats.  No chest pain though does note some chest tightness with his dyspnea.  No orthopnea or paroxysmal nocturnal dyspnea but does note that he has to use his albuterol inhaler mostly in the evenings.  Two weeks ago he was given a sample of Symbicort and notes that this is helping some however, he frequently forgets the evening dose.  The dose is 80/4.5.  He voices no other complaint nor other details of the history.  His past medical history, surgical history and family history have been reviewed they are as noted.  As noted he has been a smoker since age 33 1 pack a day.  He works in the [employer].  No welding or grinding.  No pets in the home.  No military history.  Patient has not had any travel outside of the country has lived in [place] all his life.    Review of Systems  Constitutional: Negative.   HENT: Positive for congestion and postnasal drip.   Eyes: Negative.   Respiratory: Positive for cough, chest tightness and shortness of breath.   Cardiovascular: Negative.   Gastrointestinal: Negative.        No gastroesophageal reflux symptoms.  Endocrine: Negative.   Genitourinary: Negative.   Musculoskeletal: Negative.   Skin: Negative.   Allergic/Immunologic: Negative.   Neurological: Negative.   Hematological: Negative.   Psychiatric/Behavioral: Negative.   All other systems reviewed and are negative.      Objective:   Physical Exam Vitals signs and nursing note reviewed.  Constitutional:      Appearance: Normal appearance. He is obese.  HENT:     Head: Normocephalic and atraumatic.     Right Ear: Tympanic membrane and ear canal normal.     Left Ear: Tympanic membrane and ear canal normal.     Nose: Mucosal edema and congestion present.     Right Turbinates: Swollen.     Left Turbinates: Swollen.  Neck:     Musculoskeletal: Neck supple.     Thyroid: No thyromegaly.     Trachea: Trachea and phonation normal.  Cardiovascular:     Rate and Rhythm: Normal rate and regular rhythm.     Pulses:  Normal pulses.     Heart sounds: Normal heart sounds. No murmur.  Pulmonary:     Effort: Pulmonary effort is normal.     Breath sounds: Wheezing and rhonchi present.  Abdominal:     General: Abdomen is flat. There is no distension.  Musculoskeletal: Normal range of motion.     Right lower leg: No edema.     Left lower leg: No edema.  Skin:    General: Skin is warm and dry.  Neurological:     General: No focal deficit present.     Mental Status: He is alert and oriented to person, place, and time.  Psychiatric:        Behavior: Behavior normal.        Assessment & Plan:   1.  Persistent asthma, query severity: We will switch Symbicort to Breo Ellipta 200/25 1 inhalation daily.  He will benefit from increased steroid dose  due to evidence of airway inflammation.  He was shown how to use the dry powder inhaler and how to properly rinse mouth afterwards.  This will offer him ease of dosing and no skip doses.  Will obtain pulmonary function tests to evaluate this issue further.  He may continue using albuterol as needed but hopefully the use of albuterol will become rare with the [facility].  2.  Perennial allergic rhinitis with seasonal variation: We will give him a trial of Dymista 1 inhalation to each nostril twice a day.  3.  Tobacco dependence due to cigarettes: Patient was counseled regards to discontinuation of smoking total counseling time 3 to 5 minutes.  We will see the patient in follow-up in 3 to 4 weeks time, he is to contact us prior to that time should any new difficulties arise.  Thank you for allowing me to participate in this patient's care.  This chart was dictated using voice recognition software/Dragon.  Despite best efforts to proofread, errors can occur which can change the meaning.  Any change was purely unintentional.

## 2019-09-24 ENCOUNTER — Other Ambulatory Visit: Payer: Self-pay | Admitting: Psychiatry

## 2019-09-24 DIAGNOSIS — F411 Generalized anxiety disorder: Secondary | ICD-10-CM

## 2019-09-24 DIAGNOSIS — F1020 Alcohol dependence, uncomplicated: Secondary | ICD-10-CM

## 2019-09-29 ENCOUNTER — Telehealth: Payer: Self-pay | Admitting: Pulmonary Disease

## 2019-09-29 NOTE — Telephone Encounter (Signed)
Left message to relay date/time of covid test.  [facility]

## 2019-09-30 ENCOUNTER — Other Ambulatory Visit
Admission: RE | Admit: 2019-09-30 | Discharge: 2019-09-30 | Disposition: A | Discharge status: Home / Self Care | Payer: Medicare Other | Source: Ambulatory Visit | Attending: Pulmonary Disease | Admitting: Pulmonary Disease

## 2019-09-30 ENCOUNTER — Other Ambulatory Visit: Admission: RE | Admit: 2019-09-30 | Payer: Medicare Other | Source: Ambulatory Visit

## 2019-09-30 ENCOUNTER — Other Ambulatory Visit: Payer: Self-pay

## 2019-09-30 DIAGNOSIS — Z20828 Contact with and (suspected) exposure to other viral communicable diseases: Secondary | ICD-10-CM | POA: Insufficient documentation

## 2019-09-30 DIAGNOSIS — Z01812 Encounter for preprocedural laboratory examination: Secondary | ICD-10-CM | POA: Diagnosis not present

## 2019-09-30 NOTE — Telephone Encounter (Signed)
covid test performed today per pt.  Message has been sent to [facility] regarding this matter.

## 2019-09-30 NOTE — Telephone Encounter (Signed)
Received call from Katie from PAT, who stated that swab has been located.  I have contacted pt and made him aware.  Pt would like to proceed with PFT.   Suanne Marker, please disregard canceling PFT.

## 2019-09-30 NOTE — Telephone Encounter (Signed)
Received call from [facility] with PAT. Joellen Jersey stated that swab got thrown away, due pt showing up a day early for covid test.  It was my understanding from [place] that it was protocol that covid test was to be performed day prior to PFT. This information was given to me by Suanne Marker. I relayed this information back to PAT and contacted pt to make him aware that test would need to be performed on 10/01/2019. Pt then stated that he had already left and covid test was performed. Annia Belt respomded to my message regarding this matter and stated that pt would need to quarantine until PFT on 10/02/2019. I spoke to pt to relayed this information from [place]. Pt stated that he was made aware of this information by the nurse that swabbed him and that he would do so.  Pt wished to cancel PFT altogether and would not like to reschedule at this time.   Rhonda, please advise.

## 2019-09-30 NOTE — Telephone Encounter (Signed)
Per Annia Belt- pt will need to quarantine until pft.  Pt is aware and voiced his understanding.

## 2019-09-30 NOTE — Telephone Encounter (Signed)
LG please advise. Thanks 

## 2019-09-30 NOTE — Telephone Encounter (Signed)
PFT is still scheduled for Wed 10/02/2019 at 2:15 at [facility]. Pt to arrive at 2:00 and check in at the Registration Desk. Rhonda J Cobb

## 2019-10-01 ENCOUNTER — Other Ambulatory Visit: Payer: Medicare Other

## 2019-10-01 ENCOUNTER — Other Ambulatory Visit: Admission: RE | Admit: 2019-10-01 | Payer: Medicare Other | Source: Ambulatory Visit

## 2019-10-01 LAB — SARS CORONAVIRUS 2 (TAT 6-24 HRS): SARS Coronavirus 2: NEGATIVE

## 2019-10-02 ENCOUNTER — Other Ambulatory Visit: Payer: Self-pay

## 2019-10-02 ENCOUNTER — Ambulatory Visit: Payer: Medicare Other | Attending: Pulmonary Disease

## 2019-10-02 DIAGNOSIS — J45909 Unspecified asthma, uncomplicated: Secondary | ICD-10-CM | POA: Diagnosis not present

## 2019-10-02 MED ORDER — ALBUTEROL SULFATE (2.5 MG/3ML) 0.083% IN NEBU
2.5000 mg | INHALATION_SOLUTION | Freq: Once | RESPIRATORY_TRACT | Status: AC
  Administered 2019-10-02: 2.5 mg via RESPIRATORY_TRACT
  Filled 2019-10-02: qty 3

## 2019-10-04 ENCOUNTER — Other Ambulatory Visit: Payer: Self-pay | Admitting: Psychiatry

## 2019-10-04 DIAGNOSIS — F200 Paranoid schizophrenia: Secondary | ICD-10-CM

## 2019-10-06 ENCOUNTER — Telehealth: Payer: Self-pay

## 2019-10-06 DIAGNOSIS — F411 Generalized anxiety disorder: Secondary | ICD-10-CM

## 2019-10-06 DIAGNOSIS — F1020 Alcohol dependence, uncomplicated: Secondary | ICD-10-CM

## 2019-10-06 DIAGNOSIS — F200 Paranoid schizophrenia: Secondary | ICD-10-CM

## 2019-10-06 NOTE — Telephone Encounter (Signed)
Per request from patient he was transitioned back to PMD.

## 2019-10-06 NOTE — Telephone Encounter (Signed)
pt wanted to know if you was going to refill his medications?

## 2019-10-06 NOTE — Telephone Encounter (Signed)
Dr. Marthann Schiller last note states he should continue with Dr. Shea Evans.  No refills can be sent at this time.  He may discuss this further with Dr. Raliegh Ip when he returns after 10/08/2019.

## 2019-10-06 NOTE — Telephone Encounter (Signed)
The pt left a message on the voicemail requesting to get his psychiatric medication filled by Dr. Parks Ranger. He said he discuss this with Dr. Raliegh Ip at his previous visit. Please advise

## 2019-10-07 NOTE — Telephone Encounter (Signed)
The pt was notified of Lauren response. He stated that he is almost out of the Gabapentin and needs that one refill ASAP. He also states that he spoke with Dr. Shea Evans already and informed him that Dr. Raliegh Ip would be managing his medication. The other medications that he need refill are Depakote and benztropine.

## 2019-10-08 MED ORDER — GABAPENTIN 600 MG PO TABS: 600.0000 mg | ORAL_TABLET | Freq: Three times a day (TID) | ORAL | 1 refills | Status: DC

## 2019-10-08 MED ORDER — DIVALPROEX SODIUM ER 500 MG PO TB24: 1000.0000 mg | ORAL_TABLET | Freq: Every day | ORAL | 1 refills | Status: DC

## 2019-10-08 MED ORDER — BENZTROPINE MESYLATE 1 MG PO TABS: 1.0000 mg | ORAL_TABLET | Freq: Two times a day (BID) | ORAL | 1 refills | Status: DC

## 2019-10-08 NOTE — Telephone Encounter (Signed)
The pt was notified of recommendation. He is currently at work and state he will call me back when he get a break.

## 2019-10-08 NOTE — Addendum Note (Signed)
Addended by: Olin Hauser on: 10/08/2019 12:26 PM   Modules accepted: Orders

## 2019-10-08 NOTE — Telephone Encounter (Signed)
I reviewed last note from Dr Shea Evans 09/02/19 and my last conversation with patient. I am okay to refill his Psych meds as long as he maintains follow up with Psychiatry as needed in future, if we need to adjust meds or change dose we may contact them.  I have sent refills for 90 day and refill to [facility] for him of 3 of his psych medications.  She recommended that he get a Valproic Acid (Depakote) blood level - she ordered the lab in 08/2019, it has not been done yet.  This lab is required to continue Depakote medication.  Please notify patient that he can get the lab done either through Dr Shea Evans office or I can re order it if he prefers to be drawn here when he is ready for a lab only visit.  Nobie Putnam, DO [facility] 10/08/2019, 12:25 PM

## 2019-10-09 ENCOUNTER — Encounter: Payer: Self-pay | Admitting: Pulmonary Disease

## 2019-10-09 ENCOUNTER — Other Ambulatory Visit: Payer: Self-pay

## 2019-10-09 ENCOUNTER — Ambulatory Visit (INDEPENDENT_AMBULATORY_CARE_PROVIDER_SITE_OTHER): Payer: Medicare Other | Admitting: Pulmonary Disease

## 2019-10-09 VITALS — BP 126/80 | HR 70 | Temp 97.8°F | Ht 77.0 in | Wt 220.6 lb

## 2019-10-09 DIAGNOSIS — J45909 Unspecified asthma, uncomplicated: Secondary | ICD-10-CM

## 2019-10-09 MED ORDER — INCRUSE ELLIPTA 62.5 MCG/INH IN AEPB: 1.0000 | INHALATION_SPRAY | Freq: Every day | RESPIRATORY_TRACT | 6 refills | Status: DC

## 2019-10-09 NOTE — Progress Notes (Signed)
    Assessment & Plan:  There are no diagnoses linked to this encounter.  Patient Instructions  1.  We did alpha-1 test today this is to test for potential hereditary emphysema.  The results usually take about a month to come back.  2.  We have added Incruse to your inhaler therapy.  Use Breo first and then follow with Incruse.  3.  We will see him in follow-up in 2 months time call sooner should any new difficulties arise.  Please note: late entry documentation due to logistical difficulties during COVID-19 pandemic. This note is filed for information purposes only, and is not intended to be used for billing, nor does it represent the full scope/nature of the visit in question. Please see any associated scanned media linked to date of encounter for additional pertinent information.  Subjective:    HPI: Rodney [name] is a 33 y.o. male presenting to the pulmonology clinic on 10/09/2019 with report of: Follow-up (PFT 10/02/2019. )     Outpatient Encounter Medications as of 10/09/2019  Medication Sig   [DISCONTINUED] Azelastine -Fluticasone  137-50 MCG/ACT SUSP Place 1 spray into the nose 2 (two) times daily.   [DISCONTINUED] benztropine  (COGENTIN ) 1 MG tablet Take 1 tablet (1 mg total) by mouth 2 (two) times daily. For side effects of risperidone    [DISCONTINUED] divalproex  (DEPAKOTE  ER) 500 MG 24 hr tablet Take 2 tablets (1,000 mg total) by mouth at bedtime.   [DISCONTINUED] fluticasone  furoate-vilanterol (BREO ELLIPTA ) 200-25 MCG/INH AEPB Inhale 1 puff into the lungs daily.   [DISCONTINUED] gabapentin  (NEURONTIN ) 600 MG tablet Take 1 tablet (600 mg total) by mouth 3 (three) times daily.   [DISCONTINUED] PROAIR  HFA 108 (90 Base) MCG/ACT inhaler INHALE 2 PUFFS INTO THE LUNGS EVERY 4 HOURS AS NEEDED FOR WHEEZING OR SHORTNESS OF BREATH (COUGH).   [DISCONTINUED] risperiDONE  (RISPERDAL ) 2 MG tablet Take 1 tablet (2 mg total) by mouth at bedtime.   [DISCONTINUED] umeclidinium bromide  (INCRUSE  ELLIPTA) 62.5 MCG/INH AEPB Inhale 1 puff into the lungs daily.   No facility-administered encounter medications on file as of 10/09/2019.      Objective:   Vitals:   10/09/19 1613  BP: 126/80  Pulse: 70  Temp: 97.8 F (36.6 C)  Height: 6' 5 (1.956 m)  Weight: 220 lb 9.6 oz (100.1 kg)  SpO2: 98%  TempSrc: Temporal  BMI (Calculated): 26.15     Physical exam documentation is limited by delayed entry of information.

## 2019-10-09 NOTE — Telephone Encounter (Signed)
The pt was notified of Dr. Raliegh Ip recommendation. He verbal understanding, no questions or concerns.

## 2019-10-09 NOTE — Patient Instructions (Addendum)
1.  We did alpha-1 test today this is to test for potential hereditary emphysema.  The results usually take about a month to come back.  2.  We have added Incruse to your inhaler therapy.  Use Breo first and then follow with Incruse.  3.  We will see him in follow-up in 2 months time call sooner should any new difficulties arise.

## 2019-11-17 ENCOUNTER — Telehealth: Payer: Self-pay | Admitting: Pulmonary Disease

## 2019-11-17 NOTE — Telephone Encounter (Signed)
Per LG- alpha 1 is normal.  Pt is aware of results and voiced his understanding.  Nothing further is needed.

## 2019-11-17 NOTE — Telephone Encounter (Signed)
LG please advise. Thanks 

## 2019-11-18 ENCOUNTER — Other Ambulatory Visit: Payer: Self-pay

## 2019-11-18 MED ORDER — VARENICLINE TARTRATE 0.5 MG X 11 & 1 MG X 42 PO MISC: ORAL | 0 refills | Status: DC

## 2019-11-18 MED ORDER — VARENICLINE TARTRATE 1 MG PO TABS: 1.0000 mg | ORAL_TABLET | Freq: Two times a day (BID) | ORAL | 1 refills | Status: DC

## 2019-12-01 DIAGNOSIS — F200 Paranoid schizophrenia: Secondary | ICD-10-CM

## 2019-12-01 DIAGNOSIS — Z79899 Other long term (current) drug therapy: Secondary | ICD-10-CM

## 2019-12-01 DIAGNOSIS — F411 Generalized anxiety disorder: Secondary | ICD-10-CM

## 2019-12-01 DIAGNOSIS — Z0000 Encounter for general adult medical examination without abnormal findings: Secondary | ICD-10-CM

## 2019-12-01 MED ORDER — RISPERIDONE 2 MG PO TABS: 2.0000 mg | ORAL_TABLET | Freq: Every day | ORAL | 1 refills | Status: DC

## 2019-12-09 ENCOUNTER — Encounter: Payer: Self-pay | Admitting: Pulmonary Disease

## 2019-12-09 ENCOUNTER — Ambulatory Visit (INDEPENDENT_AMBULATORY_CARE_PROVIDER_SITE_OTHER): Payer: Medicare Other | Admitting: Pulmonary Disease

## 2019-12-09 DIAGNOSIS — J45909 Unspecified asthma, uncomplicated: Secondary | ICD-10-CM

## 2019-12-09 MED ORDER — TRELEGY ELLIPTA 200-62.5-25 MCG/INH IN AEPB: 1.0000 | INHALATION_SPRAY | Freq: Every day | RESPIRATORY_TRACT | 6 refills | Status: DC

## 2019-12-09 NOTE — Progress Notes (Signed)
    Assessment & Plan:  There are no diagnoses linked to this encounter.  Patient Instructions  1.  Continue your efforts to quit smoking, you are doing a good job  2.  We will combine the Breo and the Incruse and to Trelegy Ellipta  which will contain the same dose of medication you are now getting.  3.  We will see you in follow-up in 3 months time  Please note: late entry documentation due to logistical difficulties during COVID-19 pandemic. This note is filed for information purposes only, and is not intended to be used for billing, nor does it represent the full scope/nature of the visit in question. Please see any associated scanned media linked to date of encounter for additional pertinent information.  Subjective:    HPI: Rodney [name] is a 33 y.o. male presenting to the pulmonology clinic on 12/09/2019 with report of: No chief complaint on file.     Outpatient Encounter Medications as of 12/09/2019  Medication Sig   [DISCONTINUED] Azelastine -Fluticasone  137-50 MCG/ACT SUSP Place 1 spray into the nose 2 (two) times daily.   [DISCONTINUED] benztropine  (COGENTIN ) 1 MG tablet Take 1 tablet (1 mg total) by mouth 2 (two) times daily. For side effects of risperidone    [DISCONTINUED] divalproex  (DEPAKOTE  ER) 500 MG 24 hr tablet Take 2 tablets (1,000 mg total) by mouth at bedtime.   [DISCONTINUED] fluticasone  furoate-vilanterol (BREO ELLIPTA ) 200-25 MCG/INH AEPB Inhale 1 puff into the lungs daily.   [DISCONTINUED] Fluticasone -Umeclidin-Vilant (TRELEGY ELLIPTA ) 200-62.5-25 MCG/INH AEPB Inhale 1 puff into the lungs daily.   [DISCONTINUED] gabapentin  (NEURONTIN ) 600 MG tablet Take 1 tablet (600 mg total) by mouth 3 (three) times daily.   [DISCONTINUED] PROAIR  HFA 108 (90 Base) MCG/ACT inhaler INHALE 2 PUFFS INTO THE LUNGS EVERY 4 HOURS AS NEEDED FOR WHEEZING OR SHORTNESS OF BREATH (COUGH).   [DISCONTINUED] risperiDONE  (RISPERDAL ) 2 MG tablet Take 1 tablet (2 mg total) by mouth at bedtime.    [DISCONTINUED] umeclidinium bromide  (INCRUSE ELLIPTA ) 62.5 MCG/INH AEPB Inhale 1 puff into the lungs daily.   [DISCONTINUED] varenicline  (CHANTIX  PAK) 0.5 MG X 11 & 1 MG X 42 tablet Take one 0.5 mg tablet by mouth once daily for 3 days, then increase to one 0.5 mg tablet twice daily for 4 days, then increase to one 1 mg tablet twice daily. (Patient not taking: Reported on 01/27/2020)   [DISCONTINUED] varenicline  (CHANTIX ) 1 MG tablet Take 1 tablet (1 mg total) by mouth 2 (two) times daily. (Patient not taking: Reported on 01/27/2020)   No facility-administered encounter medications on file as of 12/09/2019.      Objective:   There were no vitals filed for this visit.   Physical exam documentation is limited by delayed entry of information.

## 2019-12-09 NOTE — Patient Instructions (Signed)
1.  Continue your efforts to quit smoking, you are doing a good job  2.  We will combine the Breo and the Incruse and to Trelegy Ellipta which will contain the same dose of medication you are now getting.  3.  We will see you in follow-up in 3 months time

## 2020-01-07 ENCOUNTER — Other Ambulatory Visit: Payer: Self-pay | Admitting: Pulmonary Disease

## 2020-01-07 NOTE — Telephone Encounter (Signed)
Lm for pt

## 2020-01-08 MED ORDER — TRELEGY ELLIPTA 200-62.5-25 MCG/INH IN AEPB: 1.0000 | INHALATION_SPRAY | Freq: Every day | RESPIRATORY_TRACT | 2 refills | Status: DC

## 2020-01-08 NOTE — Telephone Encounter (Signed)
Rx for trelegy has been sent to preferred pharmacy. Pt is aware and voiced his understanding.  Nothing further is needed.

## 2020-01-27 ENCOUNTER — Other Ambulatory Visit: Payer: Self-pay

## 2020-01-27 ENCOUNTER — Ambulatory Visit (INDEPENDENT_AMBULATORY_CARE_PROVIDER_SITE_OTHER): Payer: Medicare Other | Admitting: Family Medicine

## 2020-01-27 ENCOUNTER — Ambulatory Visit: Payer: Medicare Other | Attending: Internal Medicine

## 2020-01-27 ENCOUNTER — Encounter: Payer: Self-pay | Admitting: Family Medicine

## 2020-01-27 DIAGNOSIS — Z20822 Contact with and (suspected) exposure to covid-19: Secondary | ICD-10-CM

## 2020-01-27 DIAGNOSIS — J111 Influenza due to unidentified influenza virus with other respiratory manifestations: Secondary | ICD-10-CM

## 2020-01-27 MED ORDER — XOFLUZA (80 MG DOSE) 2 X 40 MG PO TBPK: 80.0000 mg | ORAL_TABLET | Freq: Once | ORAL | 0 refills | Status: AC

## 2020-01-27 NOTE — Patient Instructions (Addendum)
START Xofluza 2 pills back to back at one time (one day and then done) *IF pharmacy does not have Xofluza in stock or cannot get it for you or direct you to OTHER pharmacy that has it, or COST is too high you can call us and we can switch it to Tamiflu*  Https://www.[email or web address] if you need a discount card for it, the pharmacy should have access to this.  - ALSO I just spoke with pharmacy - they will call you, they currently dont have it but will try to order it or locate it  For symptom control (these are optional OTC medicines)      - Take Ibuprofen / Advil 400-600mg  every 6-8 hours as needed for fever / muscle aches, and may also take Tylenol 500-1000mg  per dose every 6-8 hours or 3 times a day, can alternate dosing     - May try OTC Mucinex up to 7-10 days then stop  - Wash hands and cover cough very well to avoid spread of infection - Improve hydration with plenty of clear fluids   If significant worsening with poor fluid intake, worsening fever, difficulty breathing due to coughing, worsening body aches, weakness, or other more concerning symptoms difficulty breathing you can seek treatment at Emergency Department. Also if improved flu symptoms and then worsening days to week later with concerns for bronchitis, productive cough fever chills again we may need to check for possible pneumonia that can occur after the flu   ------------------------------------------------------------------  You may have coronavirus / COVID19  [place] COVID19 Testing Information  COVID-19 Testing By Appointment Only  Online scheduling can be done online at [email or web address] or by texting "COVID" to [phone].  Test result may take 1-3 days to result. You will be notified by MyChart or by Phone.  Phone: [phone] [facility] contact, can inquire about status of test result)  If negative test - they will call you with result. If abnormal or positive test you will be notified as  well and our office will contact you to help further with treatment plan.  May take Tylenol as needed for aches pains and fever. Prefer to avoid Ibuprofen if can help it, to avoid complication from virus.  REQUIRED self quarantine to AVOID POTENTIAL SPREAD - advised to avoid all exposure with others while during treatment. Should continue to quarantine for up to 7-14 days, pending resolution of symptoms, if symptoms resolve by 7 days and is afebrile >3 days - may STOP self quarantine at that time.  If symptoms do not resolve or significantly improve OR if WORSENING - fever / cough - or worsening shortness of breath - then should contact us and seek advice on next steps in treatment at home vs where/when to seek care at Urgent Care or Hospital ED for further intervention   Please schedule a Follow-up Appointment to: Return in about 1 week (around 02/03/2020), or if symptoms worsen or fail to improve, for flu vs covid.  If you have any other questions or concerns, please feel free to call the office or send a message through MyChart. You may also schedule an earlier appointment if necessary.  Additionally, you may be receiving a survey about your experience at our office within a few days to 1 week by e-mail or mail. We value your feedback.  Saralyn Pilar, DO [facility], [place]

## 2020-01-27 NOTE — Progress Notes (Signed)
Virtual Visit via Telephone The purpose of this virtual visit is to provide medical care while limiting exposure to the novel coronavirus (COVID19) for both patient and office staff.  Consent was obtained for phone visit:  Yes.   Answered questions that patient had about telehealth interaction:  Yes.   I discussed the limitations, risks, security and privacy concerns of performing an evaluation and management service by telephone. I also discussed with the patient that there may be a patient responsible charge related to this service. The patient expressed understanding and agreed to proceed.  Patient Location: Home Provider Location: [facility]   ---------------------------------------------------------------------- Chief Complaint  Patient presents with  . Fever    onset yesterday nausea, dizzy, light HA, low garde fever denies SOB and cough but has body ache, fatigue    S: Reviewed CMA documentation. I have called patient and gathered additional HPI as follows:  SUSPECTED Influenza vs COVID19 / FEVER / NAUSEA / Body Aches Reports that symptoms started yesterday with acute viral syndrome. Nausea dizzy lightheadedness low grade temp with body aches and chills but no coughing or dyspnea. He has had flu before, feels similar. No known sick contacts. Out of work yesterday, now called today. He had prior covid19 testing negative back in 05/2019 and 09/2019 - Tried OTC Tylenol PRN - PMH Asthma vs COPD, has inhalers using regularly and albuterol PRN with good results.  Denies any high risk travel to areas of current concern for COVID19. Denies any known or suspected exposure to person with or possibly with COVID19.  Admits nausea without vomiting, but able to keep down food and fluids Denies any cough, shortness of breath, sinus pain or pressure, abdominal pain, diarrhea  -------------------------------------------------------------------------- O: No physical exam  performed due to remote telephone encounter.  -------------------------------------------------------------------------- A&P:   Clinically consistent with Influenza based on current acute viral syndrome Cannot rule out COVID19 due to overlap in symptoms - Duration x 1 days, without complication. Tolerating PO and well hydrated - No other focal findings of infection today on history - Did not receive influenza vaccine this season  Plan: 1. Recommend COVID19 testing to be performed ASAP, location recommended [facility] site [facility]), advised patient info on test site and procedures for testing, schedule online 2. See quarantine recommendations below 3. Out of work until test result, if negative will need work note to return 4. EMPIRIC flu coverage - Start Xofluza 80mg  (40mg  x 2) x 1 dose - [facility] with [facility] gave them coupon discount they will try to order med or [facility] other [facility], call back if need switch to tamiflu 5. Supportive care as advised with NSAID / Tylenol PRN fever/myalgias, improve hydration, may take OTC Cold/Flu meds  Return criteria given if significant worsening, consider post-influenza complications, otherwise follow-up if needed  Meds ordered this encounter  Medications  . Baloxavir Marboxil,80 MG Dose, (XOFLUZA, 80 MG DOSE,) 2 x 40 MG TBPK    Sig: Take 80 mg by mouth once for 1 dose. For Flu    Dispense:  1 each    Refill:  0    REQUIRED self quarantine to AVOID POTENTIAL SPREAD - advised to avoid all exposure with others while during TESTING (Pending result) and treatment. Should continue to quarantine for up to 7-14 days - pending resolution of symptoms, if TEST IS NEGATIVE and symptoms resolve by 7 days and is afebrile >3 days - may STOP self quarantine at that time. IF test is  POSITIVE then will require 10 additional day quarantine after date of positive test result.   If symptoms do not resolve or significantly improve OR if WORSENING - fever / cough  - or worsening shortness of breath - then should contact us and seek advice on next steps in treatment at home vs where/when to seek care at Urgent Care or Hospital ED for further intervention and possible testing if indicated.  Patient verbalizes understanding with the above medical recommendations including the limitation of remote medical advice.  Specific follow-up / call-back criteria were given for patient to follow-up or seek medical care more urgently if needed.   - Time spent in direct consultation with patient on phone: 8 minutes  Nobie Putnam, [facility] 01/27/2020, 9:00 AM

## 2020-01-28 LAB — NOVEL CORONAVIRUS, NAA: SARS-CoV-2, NAA: NOT DETECTED

## 2020-02-27 ENCOUNTER — Other Ambulatory Visit: Payer: Medicare Other

## 2020-03-04 ENCOUNTER — Other Ambulatory Visit: Payer: Self-pay

## 2020-03-04 ENCOUNTER — Other Ambulatory Visit: Payer: Medicare Other

## 2020-03-04 DIAGNOSIS — Z79899 Other long term (current) drug therapy: Secondary | ICD-10-CM | POA: Diagnosis not present

## 2020-03-04 DIAGNOSIS — Z0000 Encounter for general adult medical examination without abnormal findings: Secondary | ICD-10-CM | POA: Diagnosis not present

## 2020-03-05 ENCOUNTER — Encounter: Payer: Self-pay | Admitting: Family Medicine

## 2020-03-05 ENCOUNTER — Ambulatory Visit (INDEPENDENT_AMBULATORY_CARE_PROVIDER_SITE_OTHER): Payer: Medicare Other | Admitting: Family Medicine

## 2020-03-05 VITALS — BP 123/74 | HR 61 | Temp 97.8°F | Resp 16 | Ht 77.0 in | Wt 228.0 lb

## 2020-03-05 DIAGNOSIS — Z0000 Encounter for general adult medical examination without abnormal findings: Secondary | ICD-10-CM | POA: Diagnosis not present

## 2020-03-05 DIAGNOSIS — J4489 Other specified chronic obstructive pulmonary disease: Secondary | ICD-10-CM

## 2020-03-05 DIAGNOSIS — F411 Generalized anxiety disorder: Secondary | ICD-10-CM

## 2020-03-05 DIAGNOSIS — J449 Chronic obstructive pulmonary disease, unspecified: Secondary | ICD-10-CM | POA: Diagnosis not present

## 2020-03-05 DIAGNOSIS — F200 Paranoid schizophrenia: Secondary | ICD-10-CM | POA: Diagnosis not present

## 2020-03-05 DIAGNOSIS — F17200 Nicotine dependence, unspecified, uncomplicated: Secondary | ICD-10-CM

## 2020-03-05 LAB — CBC WITH DIFFERENTIAL/PLATELET
Absolute Monocytes: 701 cells/uL (ref 200–950)
Basophils Absolute: 40 cells/uL (ref 0–200)
Basophils Relative: 0.7 %
Eosinophils Absolute: 291 cells/uL (ref 15–500)
Eosinophils Relative: 5.1 %
HCT: 40.4 % (ref 38.5–50.0)
Hemoglobin: 13.7 g/dL (ref 13.2–17.1)
Lymphs Abs: 2098 cells/uL (ref 850–3900)
MCH: 32.9 pg (ref 27.0–33.0)
MCHC: 33.9 g/dL (ref 32.0–36.0)
MCV: 96.9 fL (ref 80.0–100.0)
MPV: 9.3 fL (ref 7.5–12.5)
Monocytes Relative: 12.3 %
Neutro Abs: 2571 cells/uL (ref 1500–7800)
Neutrophils Relative %: 45.1 %
Platelets: 288 10*3/uL (ref 140–400)
RBC: 4.17 10*6/uL — ABNORMAL LOW (ref 4.20–5.80)
RDW: 12.9 % (ref 11.0–15.0)
Total Lymphocyte: 36.8 %
WBC: 5.7 10*3/uL (ref 3.8–10.8)

## 2020-03-05 LAB — COMPLETE METABOLIC PANEL WITH GFR
AG Ratio: 1.6 (calc) (ref 1.0–2.5)
ALT: 23 U/L (ref 9–46)
AST: 20 U/L (ref 10–40)
Albumin: 4.4 g/dL (ref 3.6–5.1)
Alkaline phosphatase (APISO): 44 U/L (ref 36–130)
BUN: 10 mg/dL (ref 7–25)
CO2: 27 mmol/L (ref 20–32)
Calcium: 9.4 mg/dL (ref 8.6–10.3)
Chloride: 104 mmol/L (ref 98–110)
Creat: 0.83 mg/dL (ref 0.60–1.35)
GFR, Est African American: 132 mL/min/{1.73_m2} (ref >=60)
GFR, Est Non African American: 114 mL/min/{1.73_m2} (ref >=60)
Globulin: 2.7 g/dL (calc) (ref 1.9–3.7)
Glucose, Bld: 102 mg/dL — ABNORMAL HIGH (ref 65–99)
Potassium: 4.3 mmol/L (ref 3.5–5.3)
Sodium: 139 mmol/L (ref 135–146)
Total Bilirubin: 0.4 mg/dL (ref 0.2–1.2)
Total Protein: 7.1 g/dL (ref 6.1–8.1)

## 2020-03-05 LAB — LIPID PANEL
Cholesterol: 173 mg/dL (ref <200)
HDL: 75 mg/dL (ref >=40)
LDL Cholesterol (Calc): 84 mg/dL (calc)
Non-HDL Cholesterol (Calc): 98 mg/dL (calc) (ref <130)
Total CHOL/HDL Ratio: 2.3 (calc) (ref <5.0)
Triglycerides: 49 mg/dL (ref <150)

## 2020-03-05 LAB — HEMOGLOBIN A1C
Hgb A1c MFr Bld: 5.3 % of total Hgb (ref <5.7)
Mean Plasma Glucose: 105 (calc)
eAG (mmol/L): 5.8 (calc)

## 2020-03-05 LAB — VALPROIC ACID LEVEL: Valproic Acid Lvl: 84 mg/L (ref 50.0–100.0)

## 2020-03-05 NOTE — Assessment & Plan Note (Signed)
Currently without asthma flare Improved allergy/congestion on Flonase Still smoking On Trelegy May return to [facility] soon Follow-up if worse asthma flare

## 2020-03-05 NOTE — Assessment & Plan Note (Signed)
Active smoker, now down to 0.5ppd Unsuccessful chantix after 1 month, resume smoking 01/2020 Prior 1ppd x 15 years Not ready to quit again. Future chantix repeat likely when ready

## 2020-03-05 NOTE — Assessment & Plan Note (Signed)
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

## 2020-03-05 NOTE — Patient Instructions (Addendum)
Thank you for coming to the office today.  COVID19 vaccine is recommended as discussed  Future Tdap Tetanus shot, whenever - call in advance for a nurse visit for vaccine if you like.  1. Chemistry - Normal results, including electrolytes, kidney and liver function. - Borderline elevated 102 fasting blood sugar   2. Hemoglobin A1c (Diabetes screening) - 5.3, normal not in range of Pre-Diabetes (>5.7 to 6.4)   3. Valproic Acid (Depakote) - 84, normal range.  4. CBC Blood Counts - Normal, no anemia, no other significant abnormality  5. Cholesterol - Normal cholesterol results  -------------------------------------------------------  Right ear - impacted ear wax. Can use Debrox OTC ear drop removal kit and flush it out. Or schedule an ear wax cleaning apt with me in future.  Let me know if ready to quit smoking again on chantix or other methods.  Lab AFTER visit in 6 months - will check Depakote level  For feet, blisters look okay, try cortisone to help heal, keep dry during day avoid excess moisture from sweating   Please schedule a Follow-up Appointment to: Return in about 6 months (around 09/05/2020) for 6 month Follow-up Psych, COPD/Asthma, Smoking - lab after visit Depakote.  If you have any other questions or concerns, please feel free to call the office or send a message through [place]. You may also schedule an earlier appointment if necessary.  Additionally, you may be receiving a survey about your experience at our office within a few days to 1 week by e-mail or mail. We value your feedback.  Nobie Putnam, DO [place]

## 2020-03-05 NOTE — Progress Notes (Signed)
Subjective:    Patient ID: Rodney [name], male    DOB: 1986-05-29, 34 y.o.   MRN: [id number]  Rodney [name] is a 34 y.o. male presenting on 03/05/2020 for Annual Exam   HPI   Here for Annual Physical and Lab Review.  Schizophrenia / GAD No longer followed by Psychiatry. Last lab normal Depakote level. On Depakote 500mg  ER x 2 = 1000mg  nightly  BMI >27 Weight increase 8 lbs in 6 months Diet goals to improve Exercise: Limited now, but plans to have small basketball court  COPD / Asthma Followed by [facility] Dr Patsey Berthold, last seen 11/2019 On Trelegy now, no longer Breo Doing well Still smoking again  Tobacco Abuse Followed by Fatima Sanger Stress with purchasing 1st house. Trial on Chantix for 1 month and quit successfully, was off smoking for 1.5 months, then restarted in February 2021. Now smoking less 0.5ppd, instead of 1ppd. Did not take 3 months of chantix  Additional concern  Left Foot Pain - Blisters, x 3, question if they popped from being more active.  Health Maintenance: Eligible for COVID19 vaccine Group 3 through work, has not arranged yet. Due for TDap, in future. Defer now due to covid19 vaccine.  Depression screen [facility] 2/9 03/05/2020 01/27/2020 08/29/2019  Decreased Interest 0 0 0  Down, Depressed, Hopeless 0 0 0  PHQ - 2 Score 0 0 0  Altered sleeping - - 0  Tired, decreased energy - - 0  Change in appetite - - 0  Feeling bad or failure about yourself  - - 0  Trouble concentrating - - 0  Moving slowly or fidgety/restless - - 0  Suicidal thoughts - - 0  PHQ-9 Score - - 0  Difficult doing work/chores - - Not difficult at all    Past Medical History:  Diagnosis Date  . Anxiety   . Bipolar disorder ([place])   . CIGARETTE SMOKER 02/08/2011   Qualifier: Diagnosis of  By: Wynetta Emery MD, [place]    . Seizures ([place])    Past Surgical History:  Procedure Laterality Date  . WISDOM TOOTH EXTRACTION     Social History   Socioeconomic History  . Marital status:  Significant Other    Spouse name: Not on file  . Number of children: 0  . Years of education: Not on file  . Highest education level: Associate degree: occupational, Hotel manager, or vocational program  Occupational History  . Occupation: Glass blower/designer     Comment: [place]   Tobacco Use  . Smoking status: Current Every Day Smoker    Packs/day: 0.50    Years: 15.00    Pack years: 7.50    Types: Cigarettes  . Smokeless tobacco: Never Used  . Tobacco comment: previously 1ppd - quit for 1 month on chantix 10/2019  Substance and Sexual Activity  . Alcohol use: Yes    Comment: 6 drinks a day  . Drug use: Not Currently    Types: Marijuana, Other-see comments, LSD    Comment: in past  . Sexual activity: Yes  Other Topics Concern  . Not on file  Social History Narrative  . Not on file   Social Determinants of Health   Financial Resource Strain:   . Difficulty of Paying Living Expenses:   Food Insecurity:   . Worried About Charity fundraiser in the Last Year:   . Arboriculturist in the Last Year:   Transportation Needs:   . Film/video editor (Medical):   Marland Kitchen  Lack of Transportation (Non-Medical):   Physical Activity:   . Days of Exercise per Week:   . Minutes of Exercise per Session:   Stress:   . Feeling of Stress :   Social Connections:   . Frequency of Communication with Friends and Family:   . Frequency of Social Gatherings with Friends and Family:   . Attends Religious Services:   . Active Member of Clubs or Organizations:   . Attends Banker Meetings:   Marland Kitchen Marital Status:   Intimate Partner Violence:   . Fear of Current or Ex-Partner:   . Emotionally Abused:   Marland Kitchen Physically Abused:   . Sexually Abused:    Family History  Problem Relation Age of Onset  . Anxiety disorder Mother   . OCD Brother   . Anxiety disorder Maternal Aunt   . Anxiety disorder Maternal Grandmother   . Anxiety disorder Paternal Grandfather    Current Outpatient  Medications on File Prior to Visit  Medication Sig  . Azelastine-Fluticasone 137-50 MCG/ACT SUSP Place 1 spray into the nose 2 (two) times daily.  . benztropine (COGENTIN) 1 MG tablet Take 1 tablet (1 mg total) by mouth 2 (two) times daily. For side effects of risperidone  . divalproex (DEPAKOTE ER) 500 MG 24 hr tablet Take 2 tablets (1,000 mg total) by mouth at bedtime.  . Fluticasone-Umeclidin-Vilant (TRELEGY ELLIPTA) 200-62.5-25 MCG/INH AEPB Inhale 1 puff into the lungs daily.  Marland Kitchen gabapentin (NEURONTIN) 600 MG tablet Take 1 tablet (600 mg total) by mouth 3 (three) times daily.  Marland Kitchen PROAIR HFA 108 (90 Base) MCG/ACT inhaler INHALE 2 PUFFS INTO THE LUNGS EVERY 4 HOURS AS NEEDED FOR WHEEZING OR SHORTNESS OF BREATH (COUGH).  Marland Kitchen risperiDONE (RISPERDAL) 2 MG tablet Take 1 tablet (2 mg total) by mouth at bedtime.   No current facility-administered medications on file prior to visit.    Review of Systems  Constitutional: Negative for activity change, appetite change, chills, diaphoresis, fatigue and fever.  HENT: Negative for congestion and hearing loss.   Eyes: Negative for visual disturbance.  Respiratory: Negative for apnea, cough, chest tightness, shortness of breath and wheezing.   Cardiovascular: Negative for chest pain, palpitations and leg swelling.  Gastrointestinal: Negative for abdominal pain, anal bleeding, blood in stool, constipation, diarrhea, nausea and vomiting.  Endocrine: Negative for cold intolerance.  Genitourinary: Negative for difficulty urinating, dysuria, frequency and hematuria.  Musculoskeletal: Negative for arthralgias, back pain and neck pain.  Skin: Negative for rash.  Allergic/Immunologic: Negative for environmental allergies.  Neurological: Negative for dizziness, weakness, light-headedness, numbness and headaches.  Hematological: Negative for adenopathy.  Psychiatric/Behavioral: Negative for behavioral problems, dysphoric mood and sleep disturbance. The patient is  not nervous/anxious.    Per HPI unless specifically indicated above      Objective:    BP 123/74   Pulse 61   Temp 97.8 F (36.6 C) (Temporal)   Resp 16   Ht 6\' 5"  (1.956 m)   Wt 228 lb (103.4 kg)   BMI 27.04 kg/m   Wt Readings from Last 3 Encounters:  03/05/20 228 lb (103.4 kg)  10/09/19 220 lb 9.6 oz (100.1 kg)  09/10/19 216 lb (98 kg)    Physical Exam Vitals and nursing note reviewed.  Constitutional:      General: He is not in acute distress.    Appearance: He is well-developed. He is not diaphoretic.     Comments: Well-appearing, comfortable, cooperative  HENT:     Head: Normocephalic and atraumatic.  Eyes:     General:        Right eye: No discharge.        Left eye: No discharge.     Conjunctiva/sclera: Conjunctivae normal.     Pupils: Pupils are equal, round, and reactive to light.  Neck:     Thyroid: No thyromegaly.  Cardiovascular:     Rate and Rhythm: Normal rate and regular rhythm.     Heart sounds: Normal heart sounds. No murmur.  Pulmonary:     Effort: Pulmonary effort is normal. No respiratory distress.     Breath sounds: Normal breath sounds. No wheezing or rales.  Abdominal:     General: Bowel sounds are normal. There is no distension.     Palpations: Abdomen is soft. There is no mass.     Tenderness: There is no abdominal tenderness.  Musculoskeletal:        General: No tenderness. Normal range of motion.     Cervical back: Normal range of motion and neck supple.     Comments: Upper / Lower Extremities: - Normal muscle tone, strength bilateral upper extremities 5/5, lower extremities 5/5  Lymphadenopathy:     Cervical: No cervical adenopathy.  Skin:    General: Skin is warm and dry.     Findings: No erythema or rash.     Comments: L foot with 2 healing previous blisters mid foot arch. Multiple callus  Neurological:     Mental Status: He is alert and oriented to person, place, and time.     Comments: Distal sensation intact to light touch  all extremities  Psychiatric:        Behavior: Behavior normal.     Comments: Well groomed, good eye contact, normal speech and thoughts        Results for orders placed or performed in visit on 02/27/20  Valproic Acid level  Result Value Ref Range   Valproic Acid Lvl 84.0 50.0 - 100.0 mg/L  [facility] - Lipid panel physical  Result Value Ref Range   Cholesterol 173 <200 mg/dL   HDL 75 > OR = 40 mg/dL   Triglycerides 49 <025 mg/dL   LDL Cholesterol (Calc) 84 mg/dL (calc)   Total CHOL/HDL Ratio 2.3 <5.0 (calc)   Non-HDL Cholesterol (Calc) 98 <427 mg/dL (calc)  [facility] - CMET w/ GFR CMP Complete Metabolic Panel physical  Result Value Ref Range   Glucose, Bld 102 (H) 65 - 99 mg/dL   BUN 10 7 - 25 mg/dL   Creat 0.62 3.76 - 2.83 mg/dL   GFR, Est Non African American 114 > OR = 60 mL/min/1.13m2   GFR, Est African American 132 > OR = 60 mL/min/1.69m2   BUN/Creatinine Ratio NOT APPLICABLE 6 - 22 (calc)   Sodium 139 135 - 146 mmol/L   Potassium 4.3 3.5 - 5.3 mmol/L   Chloride 104 98 - 110 mmol/L   CO2 27 20 - 32 mmol/L   Calcium 9.4 8.6 - 10.3 mg/dL   Total Protein 7.1 6.1 - 8.1 g/dL   Albumin 4.4 3.6 - 5.1 g/dL   Globulin 2.7 1.9 - 3.7 g/dL (calc)   AG Ratio 1.6 1.0 - 2.5 (calc)   Total Bilirubin 0.4 0.2 - 1.2 mg/dL   Alkaline phosphatase (APISO) 44 36 - 130 U/L   AST 20 10 - 40 U/L   ALT 23 9 - 46 U/L  [facility] - CBC with Differential/Platelet physical  Result Value Ref Range   WBC 5.7 3.8 - 10.8  Thousand/uL   RBC 4.17 (L) 4.20 - 5.80 Million/uL   Hemoglobin 13.7 13.2 - 17.1 g/dL   HCT 29.5 62.1 - 30.8 %   MCV 96.9 80.0 - 100.0 fL   MCH 32.9 27.0 - 33.0 pg   MCHC 33.9 32.0 - 36.0 g/dL   RDW 65.7 84.6 - 96.2 %   Platelets 288 140 - 400 Thousand/uL   MPV 9.3 7.5 - 12.5 fL   Neutro Abs 2,571 1,500 - 7,800 cells/uL   Lymphs Abs 2,098 850 - 3,900 cells/uL   Absolute Monocytes 701 200 - 950 cells/uL   Eosinophils Absolute 291 15 - 500 cells/uL   Basophils Absolute 40 0 - 200 cells/uL     Neutrophils Relative % 45.1 %   Total Lymphocyte 36.8 %   Monocytes Relative 12.3 %   Eosinophils Relative 5.1 %   Basophils Relative 0.7 %  [facility] - A1c LAB Hemoglobin A1C physical  Result Value Ref Range   Hgb A1c MFr Bld 5.3 <5.7 % of total Hgb   Mean Plasma Glucose 105 (calc)   eAG (mmol/L) 5.8 (calc)      Assessment & Plan:   Problem List Items Addressed This Visit    Tobacco use disorder    Active smoker, now down to 0.5ppd Unsuccessful chantix after 1 month, resume smoking 01/2020 Prior 1ppd x 15 years Not ready to quit again. Future chantix repeat likely when ready      Paranoid schizophrenia (HCC)    Stable, chronic problem, now >11+ years in remission Previously followed by [facility] Dr Elna Breslow locally now with good results On current med regimen per Psych  Last Depakote level normal (02/2020) Prior EKG QTc stable  Continue current dose of Depakote without change      GAD (generalized anxiety disorder)   Asthma with COPD (HCC)    Currently without asthma flare Improved allergy/congestion on Flonase Still smoking On Trelegy May return to [facility] soon Follow-up if worse asthma flare       Other Visit Diagnoses    Annual physical exam    -  Primary      Updated Health Maintenance information Reviewed recent lab results with patient Encouraged improvement to lifestyle with diet and exercise - Goal of weight loss   No orders of the defined types were placed in this encounter.    Follow up plan: Return in about 6 months (around 09/05/2020) for 6 month Follow-up Psych, COPD/Asthma, Smoking - lab after visit Depakote.   Future lab in 6 months Depakote only, after visit, not prior to.  Saralyn Pilar, DO [facility] 03/05/2020, 8:34 AM

## 2020-04-10 ENCOUNTER — Other Ambulatory Visit: Payer: Self-pay | Admitting: Family Medicine

## 2020-04-10 DIAGNOSIS — F200 Paranoid schizophrenia: Secondary | ICD-10-CM

## 2020-04-10 NOTE — Telephone Encounter (Signed)
Requested medication (s) are due for refill today: yes  Requested medication (s) are on the active medication list: yes  Last refill:  10/08/19  Future visit scheduled: no  Notes to clinic:  medication not delegated to NT to refill   Requested Prescriptions  Pending Prescriptions Disp Refills   divalproex (DEPAKOTE ER) 500 MG 24 hr tablet [Pharmacy Med Name: DIVALPROEX SOD ER 500 MG TAB] 180 tablet     Sig: TAKE 2 TABLETS (1,000 MG TOTAL) BY MOUTH AT BEDTIME.      Not Delegated - Neurology:  Anticonvulsants - Valproates Failed - 04/10/2020  9:41 AM      Failed - This refill cannot be delegated      Passed - AST in normal range and within 360 days    AST  Date Value Ref Range Status  03/04/2020 20 10 - 40 U/L Final          Passed - ALT in normal range and within 360 days    ALT  Date Value Ref Range Status  03/04/2020 23 9 - 46 U/L Final          Passed - HGB in normal range and within 360 days    Hemoglobin  Date Value Ref Range Status  03/04/2020 13.7 13.2 - 17.1 g/dL Final          Passed - PLT in normal range and within 360 days    Platelets  Date Value Ref Range Status  03/04/2020 288 140 - 400 Thousand/uL Final          Passed - WBC in normal range and within 360 days    WBC  Date Value Ref Range Status  03/04/2020 5.7 3.8 - 10.8 Thousand/uL Final          Passed - HCT in normal range and within 360 days    HCT  Date Value Ref Range Status  03/04/2020 40.4 38.5 - 50.0 % Final          Passed - Valproic Acid (serum) in normal range and within 360 days    Valproic Acid Lvl  Date Value Ref Range Status  03/04/2020 84.0 50.0 - 100.0 mg/L Final          Passed - Valid encounter within last 12 months    Recent Outpatient Visits           1 month ago Annual physical exam   [facility] Olin Hauser, DO   2 months ago Influenza   [employer], Devonne Doughty, DO   7 months ago Asthma with COPD  [facility])   [facility] Olin Hauser, DO   1 year ago Annual physical exam   [facility] Olin Hauser, DO   1 year ago Acute non-recurrent maxillary sinusitis   [facility], Devonne Doughty, DO               Signed Prescriptions Disp Refills   benztropine (COGENTIN) 1 MG tablet 180 tablet 1    Sig: TAKE 1 TABLET (1 MG TOTAL) BY MOUTH 2 (TWO) TIMES DAILY. FOR SIDE EFFECTS OF RISPERIDONE      Neurology: Parkinsonian Agents - benztropine mesylate Passed - 04/10/2020  9:41 AM      Passed - Last Heart Rate in normal range    Pulse Readings from Last 1 Encounters:  03/05/20 61          Passed -  Valid encounter within last 12 months    Recent Outpatient Visits           1 month ago Annual physical exam   [facility] Smitty Cords, DO   2 months ago Influenza   [facility] [place], Netta Neat, DO   7 months ago Asthma with COPD [facility])   [facility] Smitty Cords, DO   1 year ago Annual physical exam   [facility] Smitty Cords, DO   1 year ago Acute non-recurrent maxillary sinusitis   [facility], Netta Neat, DO

## 2020-04-12 ENCOUNTER — Other Ambulatory Visit: Payer: Self-pay | Admitting: Family Medicine

## 2020-04-12 DIAGNOSIS — F411 Generalized anxiety disorder: Secondary | ICD-10-CM

## 2020-04-12 DIAGNOSIS — F1020 Alcohol dependence, uncomplicated: Secondary | ICD-10-CM

## 2020-04-12 NOTE — Telephone Encounter (Signed)
Requested Prescriptions  Pending Prescriptions Disp Refills  . gabapentin (NEURONTIN) 600 MG tablet [Pharmacy Med Name: GABAPENTIN 600 MG TABLET] 270 tablet 1    Sig: TAKE 1 TABLET BY MOUTH THREE TIMES A DAY     Neurology: Anticonvulsants - gabapentin Passed - 04/12/2020  1:35 AM      Passed - Valid encounter within last 12 months    Recent Outpatient Visits          1 month ago Annual physical exam   [facility] Smitty Cords, DO   2 months ago Influenza   [facility], Netta Neat, DO   7 months ago Asthma with COPD [facility])   [facility] Smitty Cords, DO   1 year ago Annual physical exam   [facility] Smitty Cords, DO   1 year ago Acute non-recurrent maxillary sinusitis   [facility], Netta Neat, [place]

## 2020-04-14 ENCOUNTER — Other Ambulatory Visit: Payer: Self-pay

## 2020-04-14 MED ORDER — AZELASTINE-FLUTICASONE 137-50 MCG/ACT NA SUSP: 1.0000 | Freq: Two times a day (BID) | NASAL | 3 refills | Status: DC

## 2020-04-14 MED ORDER — TRELEGY ELLIPTA 200-62.5-25 MCG/INH IN AEPB: 1.0000 | INHALATION_SPRAY | Freq: Every day | RESPIRATORY_TRACT | 2 refills | Status: DC

## 2020-05-08 ENCOUNTER — Other Ambulatory Visit: Payer: Self-pay | Admitting: Family Medicine

## 2020-05-08 DIAGNOSIS — F411 Generalized anxiety disorder: Secondary | ICD-10-CM

## 2020-05-08 NOTE — Telephone Encounter (Signed)
Requested medication (s) are due for refill today: yes  Requested medication (s) are on the active medication list: yes  Last refill:  12/01/19  Future visit scheduled: no  Notes to clinic:  med not delegated to NT to RF   Requested Prescriptions  Pending Prescriptions Disp Refills   risperiDONE (RISPERDAL) 2 MG tablet [Pharmacy Med Name: RISPERIDONE 2 MG TABLET] 90 tablet 1    Sig: Take 1 tablet (2 mg total) by mouth at bedtime.      Not Delegated - Psychiatry:  Antipsychotics - Second Generation (Atypical) - risperidone Failed - 05/08/2020  9:57 AM      Failed - This refill cannot be delegated      Failed - Prolactin Level (serum) in normal range and within 180 days    No results found for: PROLACTIN, TOTPROLACTIN, LABPROL        Passed - ALT in normal range and within 180 days    ALT  Date Value Ref Range Status  03/04/2020 23 9 - 46 U/L Final          Passed - AST in normal range and within 180 days    AST  Date Value Ref Range Status  03/04/2020 20 10 - 40 U/L Final          Passed - Valid encounter within last 6 months    Recent Outpatient Visits           2 months ago Annual physical exam   [facility] Smitty Cords, DO   3 months ago Influenza   [facility], Netta Neat, DO   8 months ago Asthma with COPD [facility])   [facility] Smitty Cords, DO   1 year ago Annual physical exam   [facility] Smitty Cords, DO   1 year ago Acute non-recurrent maxillary sinusitis   [facility] Smitty Cords, DO       Future Appointments             In 2 weeks Salena Saner, MD [facility] [place]

## 2020-05-25 ENCOUNTER — Ambulatory Visit: Payer: Medicare Other | Admitting: Pulmonary Disease

## 2020-07-19 ENCOUNTER — Ambulatory Visit (INDEPENDENT_AMBULATORY_CARE_PROVIDER_SITE_OTHER): Payer: Medicare Other | Admitting: Family Medicine

## 2020-07-19 ENCOUNTER — Encounter: Payer: Self-pay | Admitting: Family Medicine

## 2020-07-19 ENCOUNTER — Other Ambulatory Visit: Payer: Self-pay

## 2020-07-19 VITALS — BP 125/74 | HR 66 | Temp 98.6°F | Resp 16 | Ht 77.0 in | Wt 210.6 lb

## 2020-07-19 DIAGNOSIS — S6992XA Unspecified injury of left wrist, hand and finger(s), initial encounter: Secondary | ICD-10-CM | POA: Diagnosis not present

## 2020-07-19 DIAGNOSIS — R1032 Left lower quadrant pain: Secondary | ICD-10-CM

## 2020-07-19 DIAGNOSIS — L03114 Cellulitis of left upper limb: Secondary | ICD-10-CM

## 2020-07-19 DIAGNOSIS — R8290 Unspecified abnormal findings in urine: Secondary | ICD-10-CM | POA: Diagnosis not present

## 2020-07-19 DIAGNOSIS — F1020 Alcohol dependence, uncomplicated: Secondary | ICD-10-CM

## 2020-07-19 DIAGNOSIS — R350 Frequency of micturition: Secondary | ICD-10-CM

## 2020-07-19 DIAGNOSIS — Z23 Encounter for immunization: Secondary | ICD-10-CM

## 2020-07-19 LAB — POCT URINALYSIS DIPSTICK
Bilirubin, UA: NEGATIVE
Blood, UA: NEGATIVE
Glucose, UA: NEGATIVE
Ketones, UA: NEGATIVE
Leukocytes, UA: NEGATIVE
Nitrite, UA: NEGATIVE
Protein, UA: NEGATIVE
Spec Grav, UA: 1.01 (ref 1.010–1.025)
Urobilinogen, UA: 0.2 E.U./dL
pH, UA: 5 (ref 5.0–8.0)

## 2020-07-19 MED ORDER — CEPHALEXIN 500 MG PO CAPS: 500.0000 mg | ORAL_CAPSULE | Freq: Three times a day (TID) | ORAL | 0 refills | Status: DC

## 2020-07-19 NOTE — Patient Instructions (Addendum)
Thank you for coming to the office today.  Will send urine out for urine culture. Dipstick was negative.  If signs of UTI or symptoms change, we can offer antibiotic, when we follow-up with your results.  Will check blood on Wednesday chemistry including kidney liver.  Can try OTC meds such as Pepto to help bloating or Gas-X  Increase Water, limit alcohol or carbonated   DUE for NON FASTING BLOOD WORK   SCHEDULE "Lab Only" visit in the morning at the clinic for lab draw in 2 days  - Make sure Lab Only appointment is at about 1 week before your next appointment, so that results will be available  For Lab Results, once available within 2-3 days of blood draw, you can can log in to MyChart online to view your results and a brief explanation. Also, we can discuss results at next follow-up visit.   Please schedule a Follow-up Appointment to: Return in about 2 days (around 07/21/2020) for 2 days for lab only non fasting.  If you have any other questions or concerns, please feel free to call the office or send a message through MyChart. You may also schedule an earlier appointment if necessary.  Additionally, you may be receiving a survey about your experience at our office within a few days to 1 week by e-mail or mail. We value your feedback.  Saralyn Pilar, DO [facility], [place]

## 2020-07-19 NOTE — Progress Notes (Signed)
Subjective:    Patient ID: Rodney [name], male    DOB: October 26, 1986, 34 y.o.   MRN: [id number]  [name] is a 34 y.o. male presenting on 07/19/2020 for Abdominal Pain (episodic darker yellow Urine and foul smell, frequency and urgency going over a month as per patient)  Patient presents for a same day appointment.  HPI   Abdominal Pain Lower Midline / Urinary Odor Reports symptoms onset for past 1 month with lower abdominal pain episodic and some left lower abdominal pain with some bloating at times, if after meal episodic, occasionally symptoms during day with urinary symptoms but seems minimal but mostly often he will hold urine overnight and have some urgency to wake up with nocturia and may have darker yellow urine and some odor. - Admits drinking more mountain dew lately. He has identified to have some dental cavities, then reduced switched drinks  - Admits some occasional extra alcohol, drinks 6 pack beer most days and few liquor shots per day as well. More on weekends. He has had UTI in past. Denies dysuria burning blood in urine Denies nausea vomiting, constipation diarrhea  Additional concerns today  Left hand skin abrasion Injured left hand on chain link fence, not up to date on tetanus vaccine, requesting today Has some slight redness tenderness. No drainage of pus Denies fever  Health Maintenance: Did not get COVID19 vaccine yet.  Depression screen [facility] 2/9 03/05/2020 01/27/2020 08/29/2019  Decreased Interest 0 0 0  Down, Depressed, Hopeless 0 0 0  PHQ - 2 Score 0 0 0  Altered sleeping - - 0  Tired, decreased energy - - 0  Change in appetite - - 0  Feeling bad or failure about yourself  - - 0  Trouble concentrating - - 0  Moving slowly or fidgety/restless - - 0  Suicidal thoughts - - 0  PHQ-9 Score - - 0  Difficult doing work/chores - - Not difficult at all    Social History   Tobacco Use  . Smoking status: Current Every Day Smoker    Packs/day: 0.50    Years:  15.00    Pack years: 7.50    Types: Cigarettes  . Smokeless tobacco: Never Used  . Tobacco comment: previously 1ppd - quit for 1 month on chantix 10/2019  Vaping Use  . Vaping Use: Former  Substance Use Topics  . Alcohol use: Yes    Comment: 6 drinks a day  . Drug use: Not Currently    Types: Marijuana, Other-see comments, LSD    Comment: in past    Review of Systems Per HPI unless specifically indicated above     Objective:    BP 125/74   Pulse 66   Temp 98.6 F (37 C) (Temporal)   Resp 16   Ht 6\' 5"  (1.956 m)   Wt (!) 210 lb 9.6 oz (95.5 kg)   SpO2 98%   BMI 24.97 kg/m   Wt Readings from Last 3 Encounters:  07/19/20 (!) 210 lb 9.6 oz (95.5 kg)  03/05/20 228 lb (103.4 kg)  10/09/19 220 lb 9.6 oz (100.1 kg)    Physical Exam Vitals and nursing note reviewed.  Constitutional:      General: He is not in acute distress.    Appearance: He is well-developed. He is not diaphoretic.     Comments: Well-appearing, comfortable, cooperative  HENT:     Head: Normocephalic and atraumatic.  Eyes:     General:  Right eye: No discharge.        Left eye: No discharge.     Conjunctiva/sclera: Conjunctivae normal.  Neck:     Thyroid: No thyromegaly.  Cardiovascular:     Rate and Rhythm: Normal rate and regular rhythm.     Heart sounds: Normal heart sounds. No murmur heard.   Pulmonary:     Effort: Pulmonary effort is normal. No respiratory distress.     Breath sounds: Normal breath sounds. No wheezing or rales.  Abdominal:     General: Bowel sounds are normal. There is no distension.     Palpations: Abdomen is soft. There is no mass.     Tenderness: There is no abdominal tenderness. There is no guarding.     Hernia: No hernia is present.     Comments: No hepatomegaly, normal margins on liver percussion  No hernia  Musculoskeletal:        General: Normal range of motion.     Cervical back: Normal range of motion and neck supple.  Lymphadenopathy:     Cervical:  No cervical adenopathy.  Skin:    General: Skin is warm and dry.     Findings: No erythema or rash.     Comments: Left hand dorsal small abrasion, now scabbed, has slight mild erythema and tender  Neurological:     Mental Status: He is alert and oriented to person, place, and time.  Psychiatric:        Behavior: Behavior normal.     Comments: Well groomed, good eye contact, normal speech and thoughts        Results for orders placed or performed in visit on 07/19/20  POCT Urinalysis Dipstick  Result Value Ref Range   Color, UA amber    Clarity, UA clear    Glucose, UA Negative Negative   Bilirubin, UA Negative    Ketones, UA Negative    Spec Grav, UA 1.010 1.010 - 1.025   Blood, UA Negative    pH, UA 5.0 5.0 - 8.0   Protein, UA Negative Negative   Urobilinogen, UA 0.2 0.2 or 1.0 E.U./dL   Nitrite, UA Negative    Leukocytes, UA Negative Negative   Appearance     Odor        Assessment & Plan:   Problem List Items Addressed This Visit    Alcohol use disorder, moderate, dependence (HCC)   Relevant Orders   COMPLETE METABOLIC PANEL WITH GFR   CBC with Differential/Platelet    Other Visit Diagnoses    Urinary frequency    -  Primary   Relevant Orders   Urine Culture   Left lower quadrant abdominal pain       Relevant Orders   POCT Urinalysis Dipstick (Completed)   COMPLETE METABOLIC PANEL WITH GFR   CBC with Differential/Platelet   Abnormal urine odor       Relevant Orders   Urine Culture   Need for diphtheria-tetanus-pertussis (Tdap) vaccine       Injury of left hand, initial encounter       Relevant Orders   Tdap vaccine greater than or equal to 7yo IM (Completed)   Cellulitis of left hand       Relevant Medications   cephALEXin (KEFLEX) 500 MG capsule      #Lower Abdominal Pain / Abnormal Urine / Alcohol intake Uncertain exact cause of his symptoms today, various symptom constellation. Not quite consistent with UTI UA negative on dipstick Will send for  Urine  Culture. Suspect some symptoms related to increased alcohol intake is concerning. No evidence of liver injury on last lab 3-4 months ago. Will repeat lab panel in 2 days CMET CBC Reduce alcohol, increase water Monitor urinary symptoms. Follow-up culture results.  #Left hand cellulitis, injury Injured on chain link fence Concern some mild signs of early cellulitis Start empiric Keflex 500 TID x 7 days, will also cover empirically if actually has UTI. Tdap today   Orders Placed This Encounter  Procedures  . Urine Culture  . Tdap vaccine greater than or equal to 7yo IM  . COMPLETE METABOLIC PANEL WITH GFR    Standing Status:   Future    Number of Occurrences:   1    Standing Expiration Date:   07/19/2021  . CBC with Differential/Platelet    Standing Status:   Future    Number of Occurrences:   1    Standing Expiration Date:   07/19/2021  . POCT Urinalysis Dipstick     Meds ordered this encounter  Medications  . cephALEXin (KEFLEX) 500 MG capsule    Sig: Take 1 capsule (500 mg total) by mouth 3 (three) times daily. For 7 days    Dispense:  21 capsule    Refill:  0      Follow up plan: Return in about 2 days (around 07/21/2020) for 2 days for lab only non fasting.   Saralyn Pilar, DO [facility] 07/19/2020, 2:30 PM

## 2020-07-20 NOTE — Telephone Encounter (Signed)
We can see when the next APP virtual visit is.  I would prefer an in person visit.  But this can be in 3 to 4 months.

## 2020-07-20 NOTE — Telephone Encounter (Signed)
Dr Jayme Cloud, pt last seen Dec 2020 and was due back in March 2021. He states he is doing well and wants next visit to be televisit due to risk of covid. Is this ok with you? Please advise, thanks!

## 2020-07-21 ENCOUNTER — Other Ambulatory Visit: Payer: Medicare Other

## 2020-07-21 ENCOUNTER — Other Ambulatory Visit: Payer: Self-pay

## 2020-07-21 DIAGNOSIS — R1032 Left lower quadrant pain: Secondary | ICD-10-CM | POA: Diagnosis not present

## 2020-07-21 LAB — URINE CULTURE
MICRO NUMBER:: 10749023
Result:: NO GROWTH
SPECIMEN QUALITY:: ADEQUATE

## 2020-07-21 LAB — CBC WITH DIFFERENTIAL/PLATELET
Absolute Monocytes: 567 cells/uL (ref 200–950)
Basophils Absolute: 61 cells/uL (ref 0–200)
Basophils Relative: 1.1 %
Eosinophils Absolute: 281 cells/uL (ref 15–500)
Eosinophils Relative: 5.1 %
HCT: 46.1 % (ref 38.5–50.0)
Hemoglobin: 15.2 g/dL (ref 13.2–17.1)
Lymphs Abs: 1980 cells/uL (ref 850–3900)
MCH: 32.9 pg (ref 27.0–33.0)
MCHC: 33 g/dL (ref 32.0–36.0)
MCV: 99.8 fL (ref 80.0–100.0)
MPV: 9.1 fL (ref 7.5–12.5)
Monocytes Relative: 10.3 %
Neutro Abs: 2613 cells/uL (ref 1500–7800)
Neutrophils Relative %: 47.5 %
Platelets: 284 10*3/uL (ref 140–400)
RBC: 4.62 10*6/uL (ref 4.20–5.80)
RDW: 13 % (ref 11.0–15.0)
Total Lymphocyte: 36 %
WBC: 5.5 10*3/uL (ref 3.8–10.8)

## 2020-07-21 LAB — COMPLETE METABOLIC PANEL WITH GFR
AG Ratio: 2.1 (calc) (ref 1.0–2.5)
ALT: 21 U/L (ref 9–46)
AST: 19 U/L (ref 10–40)
Albumin: 4.7 g/dL (ref 3.6–5.1)
Alkaline phosphatase (APISO): 43 U/L (ref 36–130)
BUN/Creatinine Ratio: 7 (calc) (ref 6–22)
BUN: 6 mg/dL — ABNORMAL LOW (ref 7–25)
CO2: 27 mmol/L (ref 20–32)
Calcium: 9.7 mg/dL (ref 8.6–10.3)
Chloride: 101 mmol/L (ref 98–110)
Creat: 0.81 mg/dL (ref 0.60–1.35)
GFR, Est African American: 133 mL/min/{1.73_m2} (ref >=60)
GFR, Est Non African American: 115 mL/min/{1.73_m2} (ref >=60)
Globulin: 2.2 g/dL (calc) (ref 1.9–3.7)
Glucose, Bld: 129 mg/dL — ABNORMAL HIGH (ref 65–99)
Potassium: 4.5 mmol/L (ref 3.5–5.3)
Sodium: 136 mmol/L (ref 135–146)
Total Bilirubin: 0.7 mg/dL (ref 0.2–1.2)
Total Protein: 6.9 g/dL (ref 6.1–8.1)

## 2020-07-27 ENCOUNTER — Other Ambulatory Visit: Payer: Self-pay

## 2020-07-27 ENCOUNTER — Other Ambulatory Visit (HOSPITAL_COMMUNITY)
Admission: RE | Admit: 2020-07-27 | Discharge: 2020-07-27 | Disposition: A | Discharge status: Home / Self Care | Payer: Medicare Other | Source: Ambulatory Visit | Attending: Family Medicine | Admitting: Family Medicine

## 2020-07-27 ENCOUNTER — Ambulatory Visit (INDEPENDENT_AMBULATORY_CARE_PROVIDER_SITE_OTHER): Payer: Medicare Other | Admitting: Family Medicine

## 2020-07-27 ENCOUNTER — Ambulatory Visit
Admission: RE | Admit: 2020-07-27 | Discharge: 2020-07-27 | Disposition: A | Discharge status: Home / Self Care | Payer: Medicare Other | Attending: Family Medicine | Admitting: Family Medicine

## 2020-07-27 ENCOUNTER — Ambulatory Visit
Admission: RE | Admit: 2020-07-27 | Discharge: 2020-07-27 | Disposition: A | Discharge status: Home / Self Care | Payer: Medicare Other | Source: Ambulatory Visit | Attending: Family Medicine | Admitting: Family Medicine

## 2020-07-27 ENCOUNTER — Encounter: Payer: Self-pay | Admitting: Family Medicine

## 2020-07-27 VITALS — BP 128/81 | HR 72 | Temp 97.5°F | Resp 16 | Ht 77.0 in | Wt 210.0 lb

## 2020-07-27 DIAGNOSIS — R1032 Left lower quadrant pain: Secondary | ICD-10-CM

## 2020-07-27 DIAGNOSIS — R109 Unspecified abdominal pain: Secondary | ICD-10-CM | POA: Diagnosis not present

## 2020-07-27 DIAGNOSIS — R350 Frequency of micturition: Secondary | ICD-10-CM

## 2020-07-27 DIAGNOSIS — R8290 Unspecified abnormal findings in urine: Secondary | ICD-10-CM

## 2020-07-27 DIAGNOSIS — F1020 Alcohol dependence, uncomplicated: Secondary | ICD-10-CM

## 2020-07-27 NOTE — Telephone Encounter (Signed)
Copied from CRM [id number]. Topic: General - Other >> Jul 27, 2020 10:31 AM Dalphine Handing A wrote: Patient was recently seen and stated that Dr potassium mentioned some imaging he would like to have patient complete. Patient would like a callback  today in regards to if he will be able to get the imaging done today during his appointment .

## 2020-07-27 NOTE — Patient Instructions (Addendum)
Thank you for coming to the office today.  X-ray today for Left flank, can catch kidney stone or other issues.  We can contact you soon, and figure out if we need to order a CT Abdomen / Pelvis.  May need blood test for Pancreas (pancreatitis) or consider the urine STD testing as well.  Please schedule a Follow-up Appointment to: Return in about 1 week (around 08/03/2020), or if symptoms worsen or fail to improve, for as needed abdominal / flank pain.  If you have any other questions or concerns, please feel free to call the office or send a message through MyChart. You may also schedule an earlier appointment if necessary.  Additionally, you may be receiving a survey about your experience at our office within a few days to 1 week by e-mail or mail. We value your feedback.  Saralyn Pilar, DO [facility], [place]

## 2020-07-27 NOTE — Telephone Encounter (Signed)
Copied from CRM #[id number]. Topic: General - Other °>> Jul 27, 2020 10:31 AM [name], Rodney [name] wrote: °Patient was recently seen and stated that Dr potassium mentioned some imaging he would like to have patient complete. Patient would like [name] callback  today in regards to if he will be able to get the imaging done today during his appointment . °

## 2020-07-27 NOTE — Progress Notes (Signed)
Subjective:    Patient ID: Rodney [name], male    DOB: October 02, 1986, 34 y.o.   MRN: [id number]  Rodney [name] is a 34 y.o. male presenting on 07/27/2020 for Abdominal Pain (LLQ --onset two week)   HPI  Left hand improved Prior injury on chain link fence, had abrasion, treated with keflex and home wound care advice it has resolved now.  Follow-up Abdominal Pain / Urinary frequency and odor Last visit 07/19/20 for same issues, see note Initial treatment empiric keflex, after had lab work up CMET CBC and urinalysis Urine culture results were negative, he finished antibiotic for hand. No confirmed UTI. It did not resolve his symptoms Today still has urinary odor and now more urinary frequency, no hematuria  Abdominal Pain Lower LLQ and L Flank pain Fairly constant pain, moderate pain, some days worse than others, does not resolve Admits episodic diarrhea x 1 attributed to food, has not persisted often he will hold urine overnight and have some urgency to wake up with nocturia and may have darker yellow urine and some odor. He had not been hydrating as well, now has improved some, still drinking alcohol - Admits some occasional extra alcohol, drinks 6 pack beer most days and few liquor shots per day as well. More on weekends. Denies dysuria burning blood in urine Denies nausea vomiting, constipation diarrhea He asks about STD testing today   [facility] 03/05/2020 01/27/2020 08/29/2019  Decreased Interest 0 0 0  Down, Depressed, Hopeless 0 0 0  PHQ - 2 Score 0 0 0  Altered sleeping - - 0  Tired, decreased energy - - 0  Change in appetite - - 0  Feeling bad or failure about yourself  - - 0  Trouble concentrating - - 0  Moving slowly or fidgety/restless - - 0  Suicidal thoughts - - 0  PHQ-9 Score - - 0  Difficult doing work/chores - - Not difficult at all    Social History   Tobacco Use  . Smoking status: Current Every Day Smoker    Packs/day: 0.50    Years: 15.00    Pack  years: 7.50    Types: Cigarettes  . Smokeless tobacco: Never Used  . Tobacco comment: previously 1ppd - quit for 1 month on chantix 10/2019  Vaping Use  . Vaping Use: Former  Substance Use Topics  . Alcohol use: Yes    Comment: 6 drinks a day  . Drug use: Not Currently    Types: Marijuana, Other-see comments, LSD    Comment: in past    Review of Systems Per HPI unless specifically indicated above     Objective:    BP 128/81   Pulse 72   Temp (!) 97.5 F (36.4 C) (Temporal)   Resp 16   Ht 6\' 5"  (1.956 m)   Wt 210 lb (95.3 kg)   SpO2 99%   BMI 24.90 kg/m   Wt Readings from Last 3 Encounters:  07/27/20 210 lb (95.3 kg)  07/19/20 (!) 210 lb 9.6 oz (95.5 kg)  03/05/20 228 lb (103.4 kg)    Physical Exam Vitals and nursing note reviewed.  Constitutional:      General: He is not in acute distress.    Appearance: He is well-developed. He is not diaphoretic.     Comments: Well-appearing, comfortable, cooperative  HENT:     Head: Normocephalic and atraumatic.  Eyes:     General:        Right  eye: No discharge.        Left eye: No discharge.     Conjunctiva/sclera: Conjunctivae normal.  Neck:     Thyroid: No thyromegaly.  Cardiovascular:     Rate and Rhythm: Normal rate and regular rhythm.     Heart sounds: Normal heart sounds. No murmur heard.   Pulmonary:     Effort: Pulmonary effort is normal. No respiratory distress.     Breath sounds: Normal breath sounds. No wheezing or rales.  Abdominal:     General: Bowel sounds are normal. There is no distension.     Palpations: Abdomen is soft. There is no mass.     Tenderness: There is abdominal tenderness in the left lower quadrant. There is rebound (mild rebound with LLQ). There is no guarding. Negative signs include Murphy's sign and McBurney's sign.     Hernia: No hernia is present.     Comments: No hepatomegaly, normal margins on liver percussion  No hernia  Musculoskeletal:        General: Normal range of motion.      Cervical back: Normal range of motion and neck supple.     Comments: Some provoked Left flank pain over lower ribs  Lymphadenopathy:     Cervical: No cervical adenopathy.  Skin:    General: Skin is warm and dry.     Findings: No erythema or rash.     Comments: Left hand dorsal small abrasion, now scabbed, has slight mild erythema and tender  Neurological:     Mental Status: He is alert and oriented to person, place, and time.  Psychiatric:        Behavior: Behavior normal.     Comments: Well groomed, good eye contact, normal speech and thoughts      I have personally reviewed the radiology report from X-ray STAT KUB 07/27/20  EXAM: ABDOMEN - 1 VIEW  COMPARISON:  None.  FINDINGS: Scattered air and stool throughout the colon and down into the rectosigmoid area. There are air-filled loops of small bowel but no distension to suggest obstruction. The soft tissue shadows of the abdomen are maintained. No worrisome calcifications. The bony structures are intact.  IMPRESSION: No plain film findings for an acute abdominal process.   Electronically Signed   By: Rudie Meyer M.D.   On: 07/27/2020 16:46  Results for orders placed or performed in visit on 07/19/20  Urine Culture   Specimen: Urine  Result Value Ref Range   MICRO NUMBER: [id number]    SPECIMEN QUALITY: Adequate    Sample Source NOT GIVEN    STATUS: FINAL    Result: No Growth   CBC with Differential/Platelet  Result Value Ref Range   WBC 5.5 3.8 - 10.8 Thousand/uL   RBC 4.62 4.20 - 5.80 Million/uL   Hemoglobin 15.2 13.2 - 17.1 g/dL   HCT 84.1 38 - 50 %   MCV 99.8 80.0 - 100.0 fL   MCH 32.9 27.0 - 33.0 pg   MCHC 33.0 32.0 - 36.0 g/dL   RDW 32.4 40.1 - 02.7 %   Platelets 284 140 - 400 Thousand/uL   MPV 9.1 7.5 - 12.5 fL   Neutro Abs 2,613 1,500 - 7,800 cells/uL   Lymphs Abs 1,980 850 - 3,900 cells/uL   Absolute Monocytes 567 200 - 950 cells/uL   Eosinophils Absolute 281 15 - 500 cells/uL    Basophils Absolute 61 0 - 200 cells/uL   Neutrophils Relative % 47.5 %   Total Lymphocyte 36.0 %  Monocytes Relative 10.3 %   Eosinophils Relative 5.1 %   Basophils Relative 1.1 %  COMPLETE METABOLIC PANEL WITH GFR  Result Value Ref Range   Glucose, Bld 129 (H) 65 - 99 mg/dL   BUN 6 (L) 7 - 25 mg/dL   Creat 2.44 0.10 - 2.72 mg/dL   GFR, Est Non African American 115 > OR = 60 mL/min/1.78m2   GFR, Est African American 133 > OR = 60 mL/min/1.19m2   BUN/Creatinine Ratio 7 6 - 22 (calc)   Sodium 136 135 - 146 mmol/L   Potassium 4.5 3.5 - 5.3 mmol/L   Chloride 101 98 - 110 mmol/L   CO2 27 20 - 32 mmol/L   Calcium 9.7 8.6 - 10.3 mg/dL   Total Protein 6.9 6.1 - 8.1 g/dL   Albumin 4.7 3.6 - 5.1 g/dL   Globulin 2.2 1.9 - 3.7 g/dL (calc)   AG Ratio 2.1 1.0 - 2.5 (calc)   Total Bilirubin 0.7 0.2 - 1.2 mg/dL   Alkaline phosphatase (APISO) 43 36 - 130 U/L   AST 19 10 - 40 U/L   ALT 21 9 - 46 U/L  POCT Urinalysis Dipstick  Result Value Ref Range   Color, UA amber    Clarity, UA clear    Glucose, UA Negative Negative   Bilirubin, UA Negative    Ketones, UA Negative    Spec Grav, UA 1.010 1.010 - 1.025   Blood, UA Negative    pH, UA 5.0 5.0 - 8.0   Protein, UA Negative Negative   Urobilinogen, UA 0.2 0.2 or 1.0 E.U./dL   Nitrite, UA Negative    Leukocytes, UA Negative Negative   Appearance     Odor        Assessment & Plan:   Problem List Items Addressed This Visit    Alcohol use disorder, moderate, dependence (HCC)   Relevant Orders   Lipase    Other Visit Diagnoses    Left flank pain    -  Primary   Relevant Orders   DG Abd 1 View (Completed)   Lipase   C-reactive protein   Urinary frequency       Relevant Orders   DG Abd 1 View (Completed)   Urine cytology ancillary only   Left lower quadrant abdominal pain       Relevant Orders   DG Abd 1 View (Completed)   Lipase   C-reactive protein   Abnormal urine odor       Relevant Orders   Urine cytology ancillary only        Unclear etiology of his symptomology still Seems multiple systems No UTI based on urine culture, already had keflex week prior Some urinary odor and frequency still, he has concern for potential STD thinks it is less likely but at this point reasonable to check, seen similar pattern with Trichomonas in male patient before, will check Urine STD testing send out for gonorrhea/chlamydia/trichomonas.  LLQ/L Flank pain Concern initially for possible kidney stone however no hematuria, not episodic colicky pain but still cannot rule this out at this time. Will check KUB, to check for stones or constipation Not endorsing other GI symptoms, less likely to be acute GI pain Does have some slight rebound pain today in LLQ Alcohol history is significant, discussed concern for possible pancreatitis, but symptoms not always related to PO intake  Will check Lipase tomorrow STAT.  Advised if significant worsening symptoms or other new severe concerns he should present  directly to hospital ED for further evaluation, labs imaging and diagnostic, given constellation of symptoms lack of improvement so far on outpatient management.  No orders of the defined types were placed in this encounter.  After KUB STAT Result, after hours I have called patient, reviewed negative result, now advised he should arrive tomorrow 8/4 8am, work in for lab Lipase, CRP, STAT and Urine STD testing   Follow up plan: Return in about 1 week (around 08/03/2020), or if symptoms worsen or fail to improve, for as needed abdominal / flank pain.   Saralyn Pilar, DO [facility] 07/27/2020, 7:01 PM

## 2020-07-28 DIAGNOSIS — R1032 Left lower quadrant pain: Secondary | ICD-10-CM | POA: Diagnosis not present

## 2020-07-28 DIAGNOSIS — R109 Unspecified abdominal pain: Secondary | ICD-10-CM | POA: Diagnosis not present

## 2020-07-29 ENCOUNTER — Other Ambulatory Visit: Payer: Self-pay | Admitting: Family Medicine

## 2020-07-29 ENCOUNTER — Telehealth: Payer: Self-pay | Admitting: Family Medicine

## 2020-07-29 DIAGNOSIS — R1032 Left lower quadrant pain: Secondary | ICD-10-CM

## 2020-07-29 LAB — URINE CYTOLOGY ANCILLARY ONLY
Chlamydia: NEGATIVE
Comment: NEGATIVE
Comment: NEGATIVE
Comment: NORMAL
Neisseria Gonorrhea: NEGATIVE
Trichomonas: NEGATIVE

## 2020-07-29 LAB — C-REACTIVE PROTEIN: CRP: 1.5 mg/L (ref <8.0)

## 2020-07-29 LAB — LIPASE: Lipase: 13 U/L (ref 7–60)

## 2020-07-29 NOTE — Telephone Encounter (Signed)
Ordered CT Renal Stone Study at [facility]  Dx R10.32 Left Lower Quad Abdominal Pain, selected reason on imaging for "Left Flank Pain suspected kidney stone"  See prior charts and recent lab results. He has not had significant improvement and work up negative so far. Negative UTI, urine culture normal, treated with Keflex already. Labs unremarkable, no sign of abnormal liver enzymes, negative Lipase 13, negative normal CRP 1.5.  KUB x-ray negative without constipation or retained stool. No obvious nephrolithiasis seen on KUB.  Still have clinical suspicion for nephrolithiasis at this time.  Can you work on prior approval for CT Renal Stone Study so it can be scheduled for next week when patient returns? He is out of town tonight and will be back next week.  Rodney Tomei, DO [facility] 07/29/2020, 11:56 AM  

## 2020-07-29 NOTE — Progress Notes (Signed)
Ordered CT Renal Stone Study at [facility]  Dx R10.32 Left Lower Quad Abdominal Pain, selected reason on imaging for "Left Flank Pain suspected kidney stone"  See prior charts and recent lab results. He has not had significant improvement and work up negative so far. Negative UTI, urine culture normal, treated with Keflex already. Labs unremarkable, no sign of abnormal liver enzymes, negative Lipase 13, negative normal CRP 1.5.  KUB x-ray negative without constipation or retained stool. No obvious nephrolithiasis seen on KUB.  Still have clinical suspicion for nephrolithiasis at this time.  Can you work on prior approval for CT Renal [place] Study so it can be scheduled for next week when patient returns? He is out of town tonight and will be back next week.  Saralyn Pilar, DO [facility] 07/29/2020, 11:56 AM

## 2020-07-30 NOTE — Telephone Encounter (Signed)
Patient's appointment scheduled for 08/06/2020 and his insurance does not need PA for CT or MRI.

## 2020-08-06 ENCOUNTER — Other Ambulatory Visit: Payer: Self-pay

## 2020-08-06 ENCOUNTER — Encounter: Payer: Self-pay | Admitting: Family Medicine

## 2020-08-06 ENCOUNTER — Ambulatory Visit
Admission: RE | Admit: 2020-08-06 | Discharge: 2020-08-06 | Disposition: A | Discharge status: Home / Self Care | Payer: Medicare Other | Source: Ambulatory Visit | Attending: Family Medicine | Admitting: Family Medicine

## 2020-08-06 DIAGNOSIS — R109 Unspecified abdominal pain: Secondary | ICD-10-CM | POA: Diagnosis not present

## 2020-08-06 DIAGNOSIS — N3289 Other specified disorders of bladder: Secondary | ICD-10-CM | POA: Diagnosis not present

## 2020-08-06 DIAGNOSIS — R1032 Left lower quadrant pain: Secondary | ICD-10-CM

## 2020-09-21 ENCOUNTER — Other Ambulatory Visit: Payer: Self-pay | Admitting: Family Medicine

## 2020-09-21 DIAGNOSIS — F411 Generalized anxiety disorder: Secondary | ICD-10-CM

## 2020-09-22 NOTE — Telephone Encounter (Signed)
Requested medications are due for refill today?  Yes - This medication refill cannot be delegated.    Requested medications are on active medication list? Yes  Last Refill:  05/10/2020  # 90 with one refill   Future visit scheduled? No   Notes to Clinic:   This medication refill cannot be delegated.

## 2020-10-08 ENCOUNTER — Ambulatory Visit: Payer: Medicare Other | Admitting: Pulmonary Disease

## 2020-10-11 ENCOUNTER — Ambulatory Visit: Payer: Self-pay | Admitting: *Deleted

## 2020-10-11 ENCOUNTER — Other Ambulatory Visit: Payer: Self-pay | Admitting: Family Medicine

## 2020-10-11 DIAGNOSIS — F200 Paranoid schizophrenia: Secondary | ICD-10-CM

## 2020-10-11 DIAGNOSIS — F411 Generalized anxiety disorder: Secondary | ICD-10-CM

## 2020-10-11 DIAGNOSIS — F1020 Alcohol dependence, uncomplicated: Secondary | ICD-10-CM

## 2020-10-11 NOTE — Telephone Encounter (Signed)
Patient states he started having symptoms Thursday of last week- he has taken 2 OTC COVID test that are negative.Patient is having cough. chills, headache, chest pain with breathing- call to office to see if patient can be seen - he has appointment- this week- no answer- information left on VM- advised UC now.  Reason for Disposition  MILD difficulty breathing (e.g., minimal/no SOB at rest, SOB with walking, pulse <100)  Answer Assessment - Initial Assessment Questions 1. COVID-19 DIAGNOSIS: "Who made your Coronavirus (COVID-19) diagnosis?" "Was it confirmed by a positive lab test?" If not diagnosed by a HCP, ask "Are there lots of cases (community spread) where you live?" (See public health department website, if unsure)     symptoms only 2. COVID-19 EXPOSURE: "Was there any known exposure to COVID before the symptoms began?" CDC Definition of close contact: within 6 feet (2 meters) for a total of 15 minutes or more over a 24-hour period.      no 3. ONSET: "When did the COVID-19 symptoms start?"      Last Thrusday 4. WORST SYMPTOM: "What is your worst symptom?" (e.g., cough, fever, shortness of breath, muscle aches)     Congestion- throat pain 5. COUGH: "Do you have a cough?" If Yes, ask: "How bad is the cough?"       Better- minor 6. FEVER: "Do you have a fever?" If Yes, ask: "What is your temperature, how was it measured, and when did it start?"     no 7. RESPIRATORY STATUS: "Describe your breathing?" (e.g., shortness of breath, wheezing, unable to speak)      SOB- with exertion 8. BETTER-SAME-WORSE: "Are you getting better, staying the same or getting worse compared to yesterday?"  If getting worse, ask, "In what way?"     Same- better/worse day to day 9. HIGH RISK DISEASE: "Do you have any chronic medical problems?" (e.g., asthma, heart or lung disease, weak immune system, obesity, etc.)     COPD 10. PREGNANCY: "Is there any chance you are pregnant?" "When was your last menstrual  period?"       n/a 11. OTHER SYMPTOMS: "Do you have any other symptoms?"  (e.g., chills, fatigue, headache, loss of smell or taste, muscle pain, sore throat; new loss of smell or taste especially support the diagnosis of COVID-19)       Chills, body aches  Protocols used: CORONAVIRUS (COVID-19) DIAGNOSED OR SUSPECTED-A-AH

## 2020-10-11 NOTE — Telephone Encounter (Signed)
Requested medication (s) are due for refill today: yes  Requested medication (s) are on the active medication list: yes  Last refill:  04/12/20 #180 1 RF  Future visit scheduled: no  Notes to clinic:  med not delegated to NT to RF    Requested Prescriptions  Pending Prescriptions Disp Refills   divalproex (DEPAKOTE ER) 500 MG 24 hr tablet [Pharmacy Med Name: DIVALPROEX SOD ER 500 MG TAB] 180 tablet     Sig: TAKE 2 TABLETS (1,000 MG TOTAL) BY MOUTH AT BEDTIME.      Not Delegated - Neurology:  Anticonvulsants - Valproates Failed - 10/11/2020  1:22 AM      Failed - This refill cannot be delegated      Passed - AST in normal range and within 360 days    AST  Date Value Ref Range Status  07/21/2020 19 10 - 40 U/L Final          Passed - ALT in normal range and within 360 days    ALT  Date Value Ref Range Status  07/21/2020 21 9 - 46 U/L Final          Passed - HGB in normal range and within 360 days    Hemoglobin  Date Value Ref Range Status  07/21/2020 15.2 13.2 - 17.1 g/dL Final          Passed - PLT in normal range and within 360 days    Platelets  Date Value Ref Range Status  07/21/2020 284 140 - 400 Thousand/uL Final          Passed - WBC in normal range and within 360 days    WBC  Date Value Ref Range Status  07/21/2020 5.5 3.8 - 10.8 Thousand/uL Final          Passed - HCT in normal range and within 360 days    HCT  Date Value Ref Range Status  07/21/2020 46.1 38 - 50 % Final          Passed - Valproic Acid (serum) in normal range and within 360 days    Valproic Acid Lvl  Date Value Ref Range Status  03/04/2020 84.0 50.0 - 100.0 mg/L Final          Passed - Valid encounter within last 12 months    Recent Outpatient Visits           2 months ago Left flank pain   [facility] [place], Netta Neat, DO   2 months ago Urinary frequency   [facility] [place], Netta Neat, DO   7 months ago Annual physical  exam   [facility] Smitty Cords, DO   8 months ago Influenza   [facility] [place], Netta Neat, DO   1 year ago Asthma with COPD [facility])   [facility] [place], Netta Neat, DO               Signed Prescriptions Disp Refills   benztropine (COGENTIN) 1 MG tablet 180 tablet 1    Sig: TAKE 1 TABLET (1 MG TOTAL) BY MOUTH 2 (TWO) TIMES DAILY. FOR SIDE EFFECTS OF RISPERIDONE      Neurology: Parkinsonian Agents - benztropine mesylate Passed - 10/11/2020  1:22 AM      Passed - Last Heart Rate in normal range    Pulse Readings from Last 1 Encounters:  07/27/20 72  Passed - Valid encounter within last 12 months    Recent Outpatient Visits           2 months ago Left flank pain   [facility] [place], Netta Neat, DO   2 months ago Urinary frequency   [facility] [place], Netta Neat, DO   7 months ago Annual physical exam   [facility] Smitty Cords, DO   8 months ago Influenza   [facility] Smitty Cords, DO   1 year ago Asthma with COPD [facility])   [facility], Netta Neat, DO

## 2020-10-11 NOTE — Telephone Encounter (Signed)
Requested Prescriptions  Pending Prescriptions Disp Refills  . benztropine (COGENTIN) 1 MG tablet [Pharmacy Med Name: BENZTROPINE MES 1 MG TABLET] 180 tablet 1    Sig: TAKE 1 TABLET (1 MG TOTAL) BY MOUTH 2 (TWO) TIMES DAILY. FOR SIDE EFFECTS OF RISPERIDONE     Neurology: Parkinsonian Agents - benztropine mesylate Passed - 10/11/2020  1:22 AM      Passed - Last Heart Rate in normal range    Pulse Readings from Last 1 Encounters:  07/27/20 72         Passed - Valid encounter within last 12 months    Recent Outpatient Visits          2 months ago Left flank pain   [facility] [place], Netta Neat, DO   2 months ago Urinary frequency   [facility] [place], Netta Neat, DO   7 months ago Annual physical exam   [facility] Smitty Cords, DO   8 months ago Influenza   [facility] [place], Netta Neat, DO   1 year ago Asthma with COPD [facility])   [facility], Netta Neat, DO             . divalproex (DEPAKOTE ER) 500 MG 24 hr tablet [Pharmacy Med Name: DIVALPROEX SOD ER 500 MG TAB] 180 tablet     Sig: TAKE 2 TABLETS (1,000 MG TOTAL) BY MOUTH AT BEDTIME.     Not Delegated - Neurology:  Anticonvulsants - Valproates Failed - 10/11/2020  1:22 AM      Failed - This refill cannot be delegated      Passed - AST in normal range and within 360 days    AST  Date Value Ref Range Status  07/21/2020 19 10 - 40 U/L Final         Passed - ALT in normal range and within 360 days    ALT  Date Value Ref Range Status  07/21/2020 21 9 - 46 U/L Final         Passed - HGB in normal range and within 360 days    Hemoglobin  Date Value Ref Range Status  07/21/2020 15.2 13.2 - 17.1 g/dL Final         Passed - PLT in normal range and within 360 days    Platelets  Date Value Ref Range Status  07/21/2020 284 140 - 400 Thousand/uL Final         Passed - WBC in normal range and within  360 days    WBC  Date Value Ref Range Status  07/21/2020 5.5 3.8 - 10.8 Thousand/uL Final         Passed - HCT in normal range and within 360 days    HCT  Date Value Ref Range Status  07/21/2020 46.1 38 - 50 % Final         Passed - Valproic Acid (serum) in normal range and within 360 days    Valproic Acid Lvl  Date Value Ref Range Status  03/04/2020 84.0 50.0 - 100.0 mg/L Final         Passed - Valid encounter within last 12 months    Recent Outpatient Visits          2 months ago Left flank pain   [facility] [place], Netta Neat, DO   2 months ago Urinary frequency   [facility] [place], Netta Neat, [place]  7 months ago Annual physical exam   [facility] Smitty Cords, DO   8 months ago Influenza   [facility] [place], Netta Neat, DO   1 year ago Asthma with COPD [facility])   [facility] [place], Netta Neat, DO

## 2020-10-12 ENCOUNTER — Ambulatory Visit (HOSPITAL_COMMUNITY)
Admission: RE | Admit: 2020-10-12 | Discharge: 2020-10-12 | Disposition: A | Discharge status: Home / Self Care | Payer: Medicare Other | Source: Ambulatory Visit | Attending: Urgent Care | Admitting: Urgent Care

## 2020-10-12 ENCOUNTER — Encounter (HOSPITAL_COMMUNITY): Payer: Self-pay

## 2020-10-12 ENCOUNTER — Other Ambulatory Visit: Payer: Self-pay

## 2020-10-12 ENCOUNTER — Ambulatory Visit (INDEPENDENT_AMBULATORY_CARE_PROVIDER_SITE_OTHER): Payer: Medicare Other

## 2020-10-12 VITALS — BP 131/78 | HR 69 | Temp 98.2°F | Resp 18

## 2020-10-12 DIAGNOSIS — R0602 Shortness of breath: Secondary | ICD-10-CM | POA: Diagnosis not present

## 2020-10-12 DIAGNOSIS — J449 Chronic obstructive pulmonary disease, unspecified: Secondary | ICD-10-CM

## 2020-10-12 DIAGNOSIS — R070 Pain in throat: Secondary | ICD-10-CM | POA: Diagnosis not present

## 2020-10-12 DIAGNOSIS — F17210 Nicotine dependence, cigarettes, uncomplicated: Secondary | ICD-10-CM | POA: Insufficient documentation

## 2020-10-12 DIAGNOSIS — R0981 Nasal congestion: Secondary | ICD-10-CM | POA: Diagnosis not present

## 2020-10-12 DIAGNOSIS — J029 Acute pharyngitis, unspecified: Secondary | ICD-10-CM | POA: Diagnosis not present

## 2020-10-12 DIAGNOSIS — R569 Unspecified convulsions: Secondary | ICD-10-CM | POA: Insufficient documentation

## 2020-10-12 DIAGNOSIS — Z79899 Other long term (current) drug therapy: Secondary | ICD-10-CM | POA: Diagnosis not present

## 2020-10-12 DIAGNOSIS — R059 Cough, unspecified: Secondary | ICD-10-CM

## 2020-10-12 DIAGNOSIS — Z20822 Contact with and (suspected) exposure to covid-19: Secondary | ICD-10-CM | POA: Insufficient documentation

## 2020-10-12 DIAGNOSIS — J3089 Other allergic rhinitis: Secondary | ICD-10-CM | POA: Insufficient documentation

## 2020-10-12 DIAGNOSIS — F319 Bipolar disorder, unspecified: Secondary | ICD-10-CM | POA: Diagnosis not present

## 2020-10-12 DIAGNOSIS — J069 Acute upper respiratory infection, unspecified: Secondary | ICD-10-CM | POA: Diagnosis not present

## 2020-10-12 LAB — POCT RAPID STREP A, ED / UC: Streptococcus, Group A Screen (Direct): NEGATIVE

## 2020-10-12 LAB — SARS CORONAVIRUS 2 (TAT 6-24 HRS): SARS Coronavirus 2: NEGATIVE

## 2020-10-12 MED ORDER — PREDNISONE 20 MG PO TABS
ORAL_TABLET | ORAL | 0 refills | Status: DC
Start: 2020-10-12 — End: 2021-03-10

## 2020-10-12 MED ORDER — BENZONATATE 100 MG PO CAPS: 100.0000 mg | ORAL_CAPSULE | Freq: Three times a day (TID) | ORAL | 0 refills | Status: DC | PRN

## 2020-10-12 MED ORDER — PROMETHAZINE-DM 6.25-15 MG/5ML PO SYRP
5.0000 mL | ORAL_SOLUTION | Freq: Every evening | ORAL | 0 refills | Status: DC | PRN
Start: 2020-10-12 — End: 2021-04-05

## 2020-10-12 MED ORDER — AZELASTINE-FLUTICASONE 137-50 MCG/ACT NA SUSP: 1.0000 | Freq: Two times a day (BID) | NASAL | 3 refills | Status: DC

## 2020-10-12 NOTE — ED Triage Notes (Signed)
Pt presents with cough, nasal congestion and body aches  5-6 days; sore throat and sob x 3 days. States having sob on exertion. States he was evaluated and he is in the early stage of COPD.

## 2020-10-12 NOTE — Discharge Instructions (Addendum)

## 2020-10-12 NOTE — ED Provider Notes (Signed)
[facility]   MRN: [id number] DOB: 19-Jun-1986  Subjective:   Rodney [name] is a 34 y.o. male presenting for 5 to 6-day history of persistent sinus congestion, throat pain now having persistent cough and shortness of breath.  Patient has not been Covid vaccinated.  Has a history of allergic rhinitis and asthma with COPD.  Patient uses a nasal spray, wants a refill.  He also uses an albuterol inhaler and also Trelegy Ellipta.  No current facility-administered medications for this encounter.  Current Outpatient Medications:  .  Azelastine-Fluticasone 137-50 MCG/ACT SUSP, Place 1 spray into the nose 2 (two) times daily., Disp: 23 g, Rfl: 3 .  benztropine (COGENTIN) 1 MG tablet, TAKE 1 TABLET (1 MG TOTAL) BY MOUTH 2 (TWO) TIMES DAILY. FOR SIDE EFFECTS OF RISPERIDONE, Disp: 180 tablet, Rfl: 1 .  divalproex (DEPAKOTE ER) 500 MG 24 hr tablet, TAKE 2 TABLETS (1,000 MG TOTAL) BY MOUTH AT BEDTIME., Disp: 180 tablet, Rfl: 1 .  Fluticasone-Umeclidin-Vilant (TRELEGY ELLIPTA) 200-62.5-25 MCG/INH AEPB, Inhale 1 puff into the lungs daily., Disp: 60 each, Rfl: 2 .  gabapentin (NEURONTIN) 600 MG tablet, TAKE 1 TABLET BY MOUTH THREE TIMES A DAY, Disp: 270 tablet, Rfl: 1 .  PROAIR HFA 108 (90 Base) MCG/ACT inhaler, INHALE 2 PUFFS INTO THE LUNGS EVERY 4 HOURS AS NEEDED FOR WHEEZING OR SHORTNESS OF BREATH (COUGH)., Disp: 8.5 Inhaler, Rfl: 2 .  risperiDONE (RISPERDAL) 2 MG tablet, TAKE 1 TABLET (2 MG TOTAL) BY MOUTH AT BEDTIME., Disp: 90 tablet, Rfl: 1   Allergies  Allergen Reactions  . Hydroxyzine Hcl Rash    Past Medical History:  Diagnosis Date  . Anxiety   . Bipolar disorder (HCC)   . CIGARETTE SMOKER 02/08/2011   Qualifier: Diagnosis of  By: Laural Benes MD, [place]    . Seizures (HCC)      Past Surgical History:  Procedure Laterality Date  . WISDOM TOOTH EXTRACTION      Family History  Problem Relation Age of Onset  . Anxiety disorder Mother   . OCD Brother   . Anxiety disorder  Maternal Aunt   . Anxiety disorder Maternal Grandmother   . Anxiety disorder Paternal Grandfather     Social History   Tobacco Use  . Smoking status: Current Every Day Smoker    Packs/day: 0.50    Years: 15.00    Pack years: 7.50    Types: Cigarettes  . Smokeless tobacco: Never Used  . Tobacco comment: previously 1ppd - quit for 1 month on chantix 10/2019  Vaping Use  . Vaping Use: Former  Substance Use Topics  . Alcohol use: Yes    Comment: 6 drinks a day  . Drug use: Not Currently    Types: Marijuana, Other-see comments, LSD    Comment: in past    ROS   Objective:   Vitals: BP 131/78 (BP Location: Right Arm)   Pulse 69   Temp 98.2 F (36.8 C) (Oral)   Resp 18   SpO2 100%   Physical Exam Constitutional:      General: He is not in acute distress.    Appearance: Normal appearance. He is well-developed. He is not ill-appearing, toxic-appearing or diaphoretic.  HENT:     Head: Normocephalic and atraumatic.     Right Ear: External ear normal.     Left Ear: External ear normal.     Nose: Congestion and rhinorrhea present.     Mouth/Throat:     Mouth: Mucous membranes  are moist.     Pharynx: No oropharyngeal exudate or posterior oropharyngeal erythema.     Comments: Significant postnasal drainage overlying pharynx. Eyes:     General: No scleral icterus.       Right eye: No discharge.        Left eye: No discharge.     Extraocular Movements: Extraocular movements intact.     Conjunctiva/sclera: Conjunctivae normal.     Pupils: Pupils are equal, round, and reactive to light.  Cardiovascular:     Rate and Rhythm: Normal rate and regular rhythm.     Heart sounds: Normal heart sounds. No murmur heard.  No friction rub. No gallop.   Pulmonary:     Effort: Pulmonary effort is normal. No respiratory distress.     Breath sounds: No stridor. Wheezing and rhonchi present. No rales.  Skin:    General: Skin is warm and dry.  Neurological:     Mental Status: He is  alert and oriented to person, place, and time.  Psychiatric:        Mood and Affect: Mood normal.        Behavior: Behavior normal.        Thought Content: Thought content normal.        Judgment: Judgment normal.     DG Chest 2 View  Result Date: 10/12/2020 CLINICAL DATA:  Shortness of breath. EXAM: CHEST - 2 VIEW COMPARISON:  No prior. FINDINGS: Mediastinum hilar structures normal. Lungs are clear. Heart size normal. Displaced fracture of the distal left clavicle noted. This may be old. IMPRESSION: 1. No acute cardiopulmonary disease. 2. Displaced fracture of the distal left clavicle. This may be old. Electronically Signed   By: Maisie Fus  Register   On: 10/12/2020 12:59   Results for orders placed or performed during the hospital encounter of 10/12/20 (from the past 24 hour(s))  POCT Rapid Strep A (ED/UC)     Status: None   Collection Time: 10/12/20 12:57 PM  Result Value Ref Range   Streptococcus, Group A Screen (Direct) NEGATIVE NEGATIVE    Assessment and Plan :   PDMP not reviewed this encounter.  1. Viral URI with cough   2. Allergic rhinitis due to other allergic trigger, unspecified seasonality   3. Asthma with COPD (HCC)     COVID-19 testing, strep culture pending.  Suspect viral respiratory infection likely worsened to him to his severe allergic rhinitis, asthma and concurrent smoking.  Recommend supportive care, refilled his nasal spray.  Use a steroid course in light of his chronic inflammatory conditions including asthma, COPD and allergic rhinitis. Counseled patient on potential for adverse effects with medications prescribed/recommended today, ER and return-to-clinic precautions discussed, patient verbalized understanding.    Wallis Bamberg, PA-C 10/12/20 1331

## 2020-10-14 ENCOUNTER — Telehealth: Payer: Medicare Other | Admitting: Family Medicine

## 2020-10-15 LAB — CULTURE, GROUP A STREP (THRC)

## 2020-11-08 ENCOUNTER — Telehealth: Payer: Self-pay | Admitting: Family Medicine

## 2020-11-08 NOTE — Telephone Encounter (Signed)
Copied from CRM [id number]. Topic: Medicare AWV >> Nov 08, 2020  2:33 PM Claudette Laws R wrote: Reason for CRM:  Left message for patient to call back and schedule the Medicare Annual Wellness Visit (AWV) virtually.  Last AWV 02/18/2019  Please schedule at anytime with [facility]  40 minute appointment  Any questions, please call me at [phone]

## 2020-12-21 DIAGNOSIS — H52223 Regular astigmatism, bilateral: Secondary | ICD-10-CM | POA: Diagnosis not present

## 2021-01-18 ENCOUNTER — Other Ambulatory Visit: Payer: Self-pay | Admitting: Pulmonary Disease

## 2021-01-19 ENCOUNTER — Telehealth: Payer: Self-pay | Admitting: Pulmonary Disease

## 2021-01-19 MED ORDER — TRELEGY ELLIPTA 200-62.5-25 MCG/INH IN AEPB: 1.0000 | INHALATION_SPRAY | Freq: Every day | RESPIRATORY_TRACT | 1 refills | Status: DC

## 2021-01-19 NOTE — Telephone Encounter (Signed)
Rx for Trelegy 200mg  has been sent to preferred pharmacy.  Patient is aware and voiced her understanding.  Nothing further needed.

## 2021-01-27 DIAGNOSIS — Z20822 Contact with and (suspected) exposure to covid-19: Secondary | ICD-10-CM | POA: Diagnosis not present

## 2021-03-10 ENCOUNTER — Other Ambulatory Visit: Payer: Self-pay

## 2021-03-10 ENCOUNTER — Encounter: Payer: Self-pay | Admitting: Pulmonary Disease

## 2021-03-10 ENCOUNTER — Ambulatory Visit: Payer: Medicare Other | Admitting: Pulmonary Disease

## 2021-03-10 VITALS — BP 122/80 | HR 61 | Temp 98.9°F | Ht 77.0 in | Wt 223.2 lb

## 2021-03-10 DIAGNOSIS — J302 Other seasonal allergic rhinitis: Secondary | ICD-10-CM

## 2021-03-10 DIAGNOSIS — J4489 Other specified chronic obstructive pulmonary disease: Secondary | ICD-10-CM

## 2021-03-10 DIAGNOSIS — J449 Chronic obstructive pulmonary disease, unspecified: Secondary | ICD-10-CM | POA: Diagnosis not present

## 2021-03-10 DIAGNOSIS — F17210 Nicotine dependence, cigarettes, uncomplicated: Secondary | ICD-10-CM | POA: Diagnosis not present

## 2021-03-10 DIAGNOSIS — J3089 Other allergic rhinitis: Secondary | ICD-10-CM | POA: Diagnosis not present

## 2021-03-10 MED ORDER — TRIAMCINOLONE ACETONIDE 55 MCG/ACT NA AERO: 1.0000 | INHALATION_SPRAY | Freq: Two times a day (BID) | NASAL | 12 refills | Status: DC

## 2021-03-10 MED ORDER — TRELEGY ELLIPTA 200-62.5-25 MCG/INH IN AEPB: 1.0000 | INHALATION_SPRAY | Freq: Every day | RESPIRATORY_TRACT | 6 refills | Status: DC

## 2021-03-10 NOTE — Patient Instructions (Signed)
We have changed your nasal spray to Nasacort 1 spray to each nostril twice a day.  We have refilled your Trelegy.  We will see you in follow-up in 4 months time call sooner should any new problems arise.

## 2021-03-10 NOTE — Progress Notes (Signed)
Subjective:    Patient ID: Rodney [name], male    DOB: 15-Apr-1986, 35 y.o.   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  He had been lost to follow-up due to the COVID-19 pandemic and he had not been seen since December 2020.  This is a routine follow-up.  He has been doing well with Trelegy Ellipta and would like a prescription for the same.  During his last visit we had tried him on Dymista for his perennial rhinitis symptoms.  He does note that this helps however it does dry his nose too much and he stops using it.  Once he stops using it then his nasal congestion symptoms return.  He has been able to perform his activities of daily living and his work without dyspnea once he was started on the Trelegy.  He has no other complaint today.  He continues to smoke 1-1/2 packs of cigarettes per day, we discussed strategies to quit smoking at length.   Review of Systems A 10 point review of systems was performed and it is as noted above otherwise negative.  Past Medical History:  Diagnosis Date  . Anxiety   . Bipolar disorder (HCC)   . CIGARETTE SMOKER 02/08/2011   Qualifier: Diagnosis of  By: Laural Benes MD, [place]    . Seizures (HCC)    Past Surgical History:  Procedure Laterality Date  . WISDOM TOOTH EXTRACTION     Social History   Tobacco Use  . Smoking status: Current Every Day Smoker    Packs/day: 2.00    Years: 20.00    Pack years: 40.00    Types: Cigarettes  . Smokeless tobacco: Never Used  . Tobacco comment: 1-1.5PPD 03/10/2021  Substance Use Topics  . Alcohol use: Yes    Comment: 6 drinks a day   Allergies  Allergen Reactions  . Hydroxyzine Hcl Rash   Current Meds  Medication Sig  . Azelastine-Fluticasone 137-50 MCG/ACT SUSP Place 1 spray into the nose 2 (two) times daily.  . benzonatate (TESSALON) 100 MG capsule Take 1-2 capsules (100-200 mg total) by mouth 3 (three)  times daily as needed.  . benztropine (COGENTIN) 1 MG tablet TAKE 1 TABLET (1 MG TOTAL) BY MOUTH 2 (TWO) TIMES DAILY. FOR SIDE EFFECTS OF RISPERIDONE  . divalproex (DEPAKOTE ER) 500 MG 24 hr tablet TAKE 2 TABLETS (1,000 MG TOTAL) BY MOUTH AT BEDTIME.  Marland Kitchen Fluticasone-Umeclidin-Vilant (TRELEGY ELLIPTA) 200-62.5-25 MCG/INH AEPB Inhale 1 puff into the lungs daily.  Marland Kitchen gabapentin (NEURONTIN) 600 MG tablet TAKE 1 TABLET BY MOUTH THREE TIMES A DAY  . PROAIR HFA 108 (90 Base) MCG/ACT inhaler INHALE 2 PUFFS INTO THE LUNGS EVERY 4 HOURS AS NEEDED FOR WHEEZING OR SHORTNESS OF BREATH (COUGH).  Marland Kitchen promethazine-dextromethorphan (PROMETHAZINE-DM) 6.25-15 MG/5ML syrup Take 5 mLs by mouth at bedtime as needed for cough.  . risperiDONE (RISPERDAL) 2 MG tablet TAKE 1 TABLET (2 MG TOTAL) BY MOUTH AT BEDTIME.  . [DISCONTINUED] predniSONE (DELTASONE) 20 MG tablet Take 2 tablets daily with breakfast.   Immunization History  Administered Date(s) Administered  . Pneumococcal Polysaccharide-23 08/09/2015  . Tdap 07/19/2020       Objective:   Physical Exam BP 122/80 (BP Location: Left Arm, Cuff Size: Normal)   Pulse 61   Temp 98.9 F (37.2 C) (Temporal)   Ht 6\' 5"  (1.956 m)   Wt 223 lb 3.2 oz (101.2 kg)  SpO2 98%   BMI 26.47 kg/m  GENERAL: Well-developed, well-nourished, no acute distress.  Fully ambulatory.  Speech is fluent. HEAD: Normocephalic, atraumatic.  EYES: Pupils equal, round, reactive to light.  No scleral icterus.  MOUTH: Masking requirements. NECK: Supple. No thyromegaly. Trachea midline. No JVD.  No adenopathy. PULMONARY: Good air entry bilaterally.  Coarse breath sounds otherwise, no adventitious sounds. CARDIOVASCULAR: S1 and S2. Regular rate and rhythm.  No rubs, murmurs or gallops heard. ABDOMEN: Benign. MUSCULOSKELETAL: No joint deformity, no clubbing, no edema.  NEUROLOGIC: No focal deficit, no gait disturbance, speech is fluent. SKIN: Intact,warm,dry.  On limited exam, no rashes PSYCH:  Somewhat flat affect, normal behavior.       Assessment & Plan:     ICD-10-CM   1. Asthma with COPD (HCC)  J44.9    Continue Trelegy Ellipta 200/62.5/25 Continue albuterol as needed Follow-up in 4 months time  2. Perennial allergic rhinitis with seasonal variation  J30.89    J30.2    Discontinue Dymista (poor patient tolerance) Nasacort 1 spray to each nostril twice a day Nasal saline spray as needed  3. Tobacco dependence due to cigarettes  F17.210    Discussed strategies to quit smoking Provided patient with information on smoking cessation program   Meds ordered this encounter  Medications  . Fluticasone-Umeclidin-Vilant (TRELEGY ELLIPTA) 200-62.5-25 MCG/INH AEPB    Sig: Inhale 1 puff into the lungs daily.    Dispense:  60 each    Refill:  6    Combining Breo and Incruse  . triamcinolone (NASACORT) 55 MCG/ACT AERO nasal inhaler    Sig: Place 1 spray into the nose 2 (two) times daily.    Dispense:  1 each    Refill:  12   Discussion: Patient is well compensated on his current regimen.  Continue same.  We discussed strategies to quit smoking.  He will follow-up in 4 months time he is to contact us prior to that time should any new difficulties arise.   Gailen Shelter, MD [facility]   *This note was dictated using voice recognition software/Dragon.  Despite best efforts to proofread, errors can occur which can change the meaning.  Any change was purely unintentional.

## 2021-03-18 ENCOUNTER — Encounter: Payer: Self-pay | Admitting: Pulmonary Disease

## 2021-04-05 ENCOUNTER — Other Ambulatory Visit: Payer: Self-pay

## 2021-04-05 ENCOUNTER — Encounter: Payer: Self-pay | Admitting: Unknown Physician Specialty

## 2021-04-05 ENCOUNTER — Telehealth (INDEPENDENT_AMBULATORY_CARE_PROVIDER_SITE_OTHER): Payer: Medicare Other | Admitting: Unknown Physician Specialty

## 2021-04-05 ENCOUNTER — Other Ambulatory Visit: Payer: Self-pay | Admitting: Unknown Physician Specialty

## 2021-04-05 VITALS — Temp 97.8°F

## 2021-04-05 DIAGNOSIS — J069 Acute upper respiratory infection, unspecified: Secondary | ICD-10-CM

## 2021-04-05 MED ORDER — AZITHROMYCIN 250 MG PO TABS
ORAL_TABLET | ORAL | 0 refills | Status: DC
Start: 2021-04-05 — End: 2021-06-30

## 2021-04-05 MED ORDER — PREDNISONE 20 MG PO TABS: 20.0000 mg | ORAL_TABLET | Freq: Every day | ORAL | 0 refills | Status: DC

## 2021-04-05 NOTE — Patient Instructions (Signed)
Vit D 800 IUs Vit C 500 TID Zinc 50 mg Quercetin

## 2021-04-05 NOTE — Progress Notes (Signed)
Temp 97.8 F (36.6 C) (Oral)    Subjective:    Patient ID: Rodney [name], male    DOB: 20-Nov-1986, 35 y.o.   MRN: [id number]  HPI: Rodney [name] is a 35 y.o. male  Chief Complaint  Patient presents with  . Sinus Problem    Sinus congestion, severe headache, scratchy throat, frontal lobe sinus pressure, jaw pain and runny nose x 4 days . Negative Home Covid test x 4 days ago    Due to the catastrophic nature of the COVID-19 pandemic, this visit was completed via audio and visual contact via [employer] due to the restrictions of the COVID-19 pandemic. All issues as above were discussed and addressed. Physical exam was done as above through visual confirmation on [employer]. If it was felt that the patient should be evaluated in the office, they were directed there. The patient verbally consented to this visit."} . Location of the patient: home . Location of the provider: work . Those involved with this call:  . Provider: Gabriel Cirri, DNP . CMA: Remer Macho . Front Desk/Registration: Fleet Contras  . Time spent on call: 15 minutes with patient face to face via video conference. More than 50% of this time was spent in counseling and coordination of care. 5 minutes total spent in review of patient's record and preparation of their chart.  I verified patient identity using two factors (patient name and date of birth). Patient consents verbally to being seen via telemedicine visit today.   Sinus Problem This is a new (Sx started Saturday - 5days ago) problem. There has been no fever. Associated symptoms include congestion, coughing, headaches, a hoarse voice, sinus pressure and a sore throat. Pertinent negatives include no chills, diaphoresis, shortness of breath or swollen glands.   Positive smoker and COPD.  Went on a field trip to the zoo with daughter.  Wife also feeling bad.    Relevant past medical, surgical, family and social history reviewed and updated as indicated. Interim medical  history since our last visit reviewed. Allergies and medications reviewed and updated.  Review of Systems  Constitutional: Negative for chills and diaphoresis.  HENT: Positive for congestion, hoarse voice, sinus pressure and sore throat.   Respiratory: Positive for cough. Negative for shortness of breath.   Neurological: Positive for headaches.    Per HPI unless specifically indicated above     Objective:    Temp 97.8 F (36.6 C) (Oral)   Wt Readings from Last 3 Encounters:  03/10/21 223 lb 3.2 oz (101.2 kg)  07/27/20 210 lb (95.3 kg)  07/19/20 (!) 210 lb 9.6 oz (95.5 kg)    Physical Exam Vitals and nursing note reviewed.  Constitutional:      General: He is not in acute distress.    Appearance: Normal appearance. He is well-developed.  HENT:     Head: Normocephalic and atraumatic.     Right Ear: Tympanic membrane and ear canal normal.     Left Ear: Tympanic membrane and ear canal normal.     Nose: Rhinorrhea present.     Right Sinus: No maxillary sinus tenderness or frontal sinus tenderness.     Left Sinus: No maxillary sinus tenderness or frontal sinus tenderness.     Mouth/Throat:     Pharynx: Uvula midline.  Eyes:     General: Lids are normal. No scleral icterus.       Right eye: No discharge.        Left eye: No discharge.  Conjunctiva/sclera: Conjunctivae normal.  Cardiovascular:     Rate and Rhythm: Normal rate and regular rhythm.     Heart sounds: Normal heart sounds.  Pulmonary:     Effort: Pulmonary effort is normal. No respiratory distress.     Breath sounds: Normal breath sounds.  Abdominal:     Palpations: There is no hepatomegaly or splenomegaly.  Musculoskeletal:        General: Normal range of motion.     Cervical back: Neck supple.  Skin:    General: Skin is warm and dry.     Coloration: Skin is not pale.     Findings: No rash.  Neurological:     Mental Status: He is alert and oriented to person, place, and time.  Psychiatric:         Behavior: Behavior normal.        Thought Content: Thought content normal.        Judgment: Judgment normal.       Assessment & Plan:   Problem List Items Addressed This Visit   None   Visit Diagnoses    Viral upper respiratory tract infection    -  Primary   Sxs consistent with viral infection. Will take another Covid test.  Good candidate for mab if postitive as unvaccinated. Vit C, D, Quercetin, Zinc       Follow up plan: F/u with Covid test.  Consider prednisone and Zithromax if negative due to worsening trajectory

## 2021-04-12 ENCOUNTER — Other Ambulatory Visit: Payer: Self-pay | Admitting: Family Medicine

## 2021-04-12 DIAGNOSIS — F200 Paranoid schizophrenia: Secondary | ICD-10-CM

## 2021-04-12 NOTE — Telephone Encounter (Signed)
Requested medications are due for refill today yes  Requested medications are on the active medication list yes  Last refill 01/07/21  Last visit 02/2020  Future visit scheduled no, was to return in 08/2020  Notes to clinic Depakote ER Not Delegated, the other did not pass protocol due to not returning for visit in 08/2020 and no upcoming appt.

## 2021-05-16 ENCOUNTER — Other Ambulatory Visit: Payer: Self-pay | Admitting: Family Medicine

## 2021-05-16 DIAGNOSIS — F411 Generalized anxiety disorder: Secondary | ICD-10-CM

## 2021-05-16 DIAGNOSIS — F1020 Alcohol dependence, uncomplicated: Secondary | ICD-10-CM

## 2021-05-16 MED ORDER — GABAPENTIN 600 MG PO TABS: 600.0000 mg | ORAL_TABLET | Freq: Three times a day (TID) | ORAL | 0 refills | Status: DC

## 2021-05-16 NOTE — Telephone Encounter (Signed)
Medication Refill - Medication: gabapentin (NEURONTIN) 600 MG tablet   Has the patient contacted their pharmacy? Yes.   (Agent: If no, request that the patient contact the pharmacy for the refill.) (Agent: If yes, when and what did the pharmacy advise?)request sent on 5.20.22/pt is out of medication and would like refill today   Preferred Pharmacy (with phone number or street name): [facility] [phone] Judithann Sheen, [place] - [address] [place] [address]  Phone:  [phone] Fax:  [phone]   Agent: Please be advised that RX refills may take up to 3 business days. We ask that you follow-up with your pharmacy.

## 2021-06-13 ENCOUNTER — Other Ambulatory Visit: Payer: Self-pay | Admitting: Family Medicine

## 2021-06-13 DIAGNOSIS — F411 Generalized anxiety disorder: Secondary | ICD-10-CM

## 2021-06-30 ENCOUNTER — Ambulatory Visit (INDEPENDENT_AMBULATORY_CARE_PROVIDER_SITE_OTHER): Payer: Medicare Other | Admitting: Family Medicine

## 2021-06-30 ENCOUNTER — Other Ambulatory Visit: Payer: Self-pay

## 2021-06-30 ENCOUNTER — Other Ambulatory Visit: Payer: Self-pay | Admitting: Family Medicine

## 2021-06-30 ENCOUNTER — Encounter: Payer: Self-pay | Admitting: Family Medicine

## 2021-06-30 VITALS — BP 114/73 | HR 63 | Ht 77.0 in | Wt 218.0 lb

## 2021-06-30 DIAGNOSIS — R112 Nausea with vomiting, unspecified: Secondary | ICD-10-CM | POA: Diagnosis not present

## 2021-06-30 DIAGNOSIS — R1012 Left upper quadrant pain: Secondary | ICD-10-CM | POA: Diagnosis not present

## 2021-06-30 DIAGNOSIS — F200 Paranoid schizophrenia: Secondary | ICD-10-CM

## 2021-06-30 DIAGNOSIS — R109 Unspecified abdominal pain: Secondary | ICD-10-CM

## 2021-06-30 MED ORDER — DIVALPROEX SODIUM ER 500 MG PO TB24: 1000.0000 mg | ORAL_TABLET | Freq: Every day | ORAL | 0 refills | Status: DC

## 2021-06-30 MED ORDER — DICYCLOMINE HCL 10 MG PO CAPS: 10.0000 mg | ORAL_CAPSULE | Freq: Three times a day (TID) | ORAL | 0 refills | Status: DC

## 2021-06-30 MED ORDER — ONDANSETRON 4 MG PO TBDP: 4.0000 mg | ORAL_TABLET | Freq: Three times a day (TID) | ORAL | 2 refills | Status: DC | PRN

## 2021-06-30 NOTE — Patient Instructions (Signed)
Thank you for coming to the office today.  Dicyclomine (Bentyl) as needed for stomach digestive abdominal pains with or without meal  Zofran for nausea  Stay tuned for GI specialist referral  [facility]) [address] - [address] [place], [place] [address] Phone: [phone]   [facility]) [address] [place], [place] [address] Main: [phone]  In future can do further referral to Urology for bladder symptoms  We can consider more repeat blood work as well if needed, or GI can do this.  If worsening abdominal pain severity nausea vomiting cannot keep food fluid liquids down, then seek care sooner at urgent care or hospital ED  Please schedule a Follow-up Appointment to: No follow-ups on file.  If you have any other questions or concerns, please feel free to call the office or send a message through MyChart. You may also schedule an earlier appointment if necessary.  Additionally, you may be receiving a survey about your experience at our office within a few days to 1 week by e-mail or mail. We value your feedback.  Saralyn Pilar, DO [facility], [place]

## 2021-06-30 NOTE — Progress Notes (Signed)
Subjective:    Patient ID: Rodney [name], male    DOB: 1986-09-03, 35 y.o.   MRN: [id number]  Rodney [name] is a 35 y.o. male presenting on 06/30/2021 for Abdominal Pain and Diarrhea  Patient presents for a same day appointment.  HPI  Abdominal Pain LUQ and L Flank pain Urinary Odor  Previously evaluated in July and August 2021 for same symptoms  Initial treatment empiric keflex, after had lab work up CMET CBC and urinalysis Urine culture results were negative, he finished antibiotic for hand. No confirmed UTI. It did not resolve his symptoms He has had history in past with urinary odor, urinary frequency  He had labs with Lipase CRP as well. Unremarkable.  He had KUB Abd X-ray negative and CT Renal Stone Study without nephrolithiasis, it did show bladder wall thickening cystitis  Ultimately his symptoms seemed to have not resolved but have been episodic, now worsening and he returns for re-evaluation.   often he will hold urine overnight and have some urgency to wake up with nocturia and may have darker yellow urine and some odor. He had not been hydrating as well, now has improved some, still drinking alcohol Admits some occasional extra alcohol, drinks 6 pack beer most days and few liquor shots per day as well. More on weekends.  Now has more nausea LUQ abdominal pain and flank pain. No particular exact trigger unsure if related to eating PO  Currently still feels sometimes has pain or pressure on bladder if too full Only able to work half day on Tuesday then out since He does consume alcohol regularly lately Admits history of GERD, gas pains Admits urinary odor Admits nausea without vomiting Admits couple days of diarrhea He has a good appetite still. Tolerating PO.  Denies dysuria burning blood in urine, fever chills body aches, constipation dark stool  Depression screen [facility] 2/9 03/05/2020 01/27/2020 08/29/2019  Decreased Interest 0 0 0  Down, Depressed, Hopeless 0 0 0  PHQ -  2 Score 0 0 0  Altered sleeping - - 0  Tired, decreased energy - - 0  Change in appetite - - 0  Feeling bad or failure about yourself  - - 0  Trouble concentrating - - 0  Moving slowly or fidgety/restless - - 0  Suicidal thoughts - - 0  PHQ-9 Score - - 0  Difficult doing work/chores - - Not difficult at all    Social History   Tobacco Use   Smoking status: Every Day    Packs/day: 2.00    Years: 20.00    Pack years: 40.00    Types: Cigarettes   Smokeless tobacco: Never   Tobacco comments:    1-1.5PPD 03/10/2021  Vaping Use   Vaping Use: Former  Substance Use Topics   Alcohol use: Yes    Comment: 6 drinks a day   Drug use: Not Currently    Types: Marijuana, Other-see comments, LSD    Comment: in past    Review of Systems Per HPI unless specifically indicated above     Objective:    BP 114/73   Pulse 63   Ht 6\' 5"  (1.956 m)   Wt 218 lb (98.9 kg)   SpO2 98%   BMI 25.85 kg/m   Wt Readings from Last 3 Encounters:  06/30/21 218 lb (98.9 kg)  03/10/21 223 lb 3.2 oz (101.2 kg)  07/27/20 210 lb (95.3 kg)    Physical Exam Vitals and nursing note reviewed.  Constitutional:  General: He is not in acute distress.    Appearance: He is well-developed. He is not diaphoretic.     Comments: Well-appearing, comfortable, cooperative  HENT:     Head: Normocephalic and atraumatic.  Eyes:     General:        Right eye: No discharge.        Left eye: No discharge.     Conjunctiva/sclera: Conjunctivae normal.  Neck:     Thyroid: No thyromegaly.  Cardiovascular:     Rate and Rhythm: Normal rate and regular rhythm.     Pulses: Normal pulses.     Heart sounds: Normal heart sounds. No murmur heard. Pulmonary:     Effort: Pulmonary effort is normal. No respiratory distress.     Breath sounds: Normal breath sounds. No wheezing or rales.  Abdominal:     General: Bowel sounds are normal. There is no distension.     Palpations: Abdomen is soft.     Tenderness: There is  abdominal tenderness in the left upper quadrant. There is no right CVA tenderness, left CVA tenderness, guarding or rebound. Negative signs include Murphy's sign and McBurney's sign.  Musculoskeletal:        General: Normal range of motion.     Cervical back: Normal range of motion and neck supple.  Lymphadenopathy:     Cervical: No cervical adenopathy.  Skin:    General: Skin is warm and dry.     Findings: No erythema or rash.  Neurological:     Mental Status: He is alert and oriented to person, place, and time. Mental status is at baseline.  Psychiatric:        Behavior: Behavior normal.     Comments: Well groomed, good eye contact, normal speech and thoughts    I have personally reviewed the radiology report from 08/06/20 CT Renal Stone Study  CLINICAL DATA:  No hx surg to a/p. No hx kidney stones. Intermittent left flank pain x 3 weeks. Denies n/v/d or gross hematuria.   EXAM: CT ABDOMEN AND PELVIS WITHOUT CONTRAST   TECHNIQUE: Multidetector CT imaging of the abdomen and pelvis was performed following the standard protocol without IV contrast.   COMPARISON:  CT abdomen pelvis 08/23/2007   FINDINGS: Lower chest: No acute abnormality.   Somewhat limited evaluation of the abdominal viscera given the lack of IV contrast.   Hepatobiliary: No focal liver abnormality is seen. No gallstones, gallbladder wall thickening, or biliary dilatation.   Pancreas: Unremarkable.  No surrounding fat stranding.   Spleen: Normal in size without focal abnormality.   Adrenals/Urinary Tract: Adrenal glands are unremarkable. The kidneys are symmetric in size. No renal calculi identified. No hydronephrosis. There is diffuse bladder wall thickening.   Stomach/Bowel: Stomach is within normal limits. Appendix appears normal. No evidence of bowel wall thickening, distention, or inflammatory changes.   Vascular/Lymphatic: No significant vascular findings are present. No enlarged abdominal or  pelvic lymph nodes.   Reproductive: Prostate is unremarkable.   Other: No abdominal wall hernia or abnormality. No abdominopelvic ascites.   Musculoskeletal: No acute or significant osseous findings.   IMPRESSION: 1. Diffuse bladder wall thickening, which may represent cystitis. Recommend correlation with urinalysis. 2. No renal calculi or hydronephrosis.     Electronically Signed   By: Emmaline Kluver M.D.   On: 08/06/2020 14:37  Results for orders placed or performed during the hospital encounter of 10/12/20  SARS CORONAVIRUS 2 (TAT 6-24 HRS) Nasopharyngeal Nasopharyngeal Swab   Specimen: Nasopharyngeal Swab  Result Value Ref Range   SARS Coronavirus 2 NEGATIVE NEGATIVE  Culture, group A strep (throat)   Specimen: Throat  Result Value Ref Range   Specimen Description THROAT    Special Requests NONE    Culture      NO GROUP A STREP (S.PYOGENES) ISOLATED Performed at [facility], [address], [place], [place] [address]    Report Status 10/15/2020 FINAL   POCT Rapid Strep A (ED/UC)  Result Value Ref Range   Streptococcus, Group A Screen (Direct) NEGATIVE NEGATIVE      Assessment & Plan:   Problem List Items Addressed This Visit   None Visit Diagnoses     LUQ abdominal pain    -  Primary   Relevant Medications   dicyclomine (BENTYL) 10 MG capsule   Other Relevant Orders   Ambulatory referral to Gastroenterology   Left flank pain       Relevant Medications   dicyclomine (BENTYL) 10 MG capsule   Other Relevant Orders   Ambulatory referral to Gastroenterology   Non-intractable vomiting with nausea, unspecified vomiting type       Relevant Medications   ondansetron (ZOFRAN ODT) 4 MG disintegrating tablet   Other Relevant Orders   Ambulatory referral to Gastroenterology       Subacute on Chronic Problems now (>1 year with recurrence) Constellation of symptoms Unclear etiology of his symptomology still Seems multiple systems GI vs Urinary    LLQ/L Flank pain No evidence of Nephrolithasis on CT Renal stone study 07/2020  Urine culture was negative before  Bladder thickening / Cystitis seen on CT prior  Alcohol history is significant, discussed concern for possible pancreatitis, but symptoms not always related to PO intake and prior Lipase was Negative last time.  Start Zofran ODT for nausea Start Dicyclomine PRN abdominal pain Limit alcohol, future will return for alcohol cessation  Referral to GI for further assistance. Ultimately may have constellation of symptoms from alcohol gastritis is possibility causing digestive symptoms.  Advised if significant worsening symptoms or other new severe concerns he should present directly to hospital ED for further evaluation, labs imaging and diagnostic, given constellation of symptoms lack of improvement so far on outpatient management.    Orders Placed This Encounter  Procedures   Ambulatory referral to Gastroenterology    Referral Priority:   Routine    Referral Type:   Consultation    Referral Reason:   Specialty Services Required    Number of Visits Requested:   1     Meds ordered this encounter  Medications   ondansetron (ZOFRAN ODT) 4 MG disintegrating tablet    Sig: Take 1 tablet (4 mg total) by mouth every 8 (eight) hours as needed for nausea or vomiting.    Dispense:  30 tablet    Refill:  2   dicyclomine (BENTYL) 10 MG capsule    Sig: Take 1 capsule (10 mg total) by mouth 4 (four) times daily -  before meals and at bedtime.    Dispense:  30 capsule    Refill:  0     Follow up plan: Return if symptoms worsen or fail to improve, for can return in 3 months for annual physical and labs.  Future labs / physical including depakote lvl in 3 months  Saralyn Pilar, DO [facility] 06/30/2021, 11:49 AM

## 2021-07-04 ENCOUNTER — Encounter: Payer: Self-pay | Admitting: *Deleted

## 2021-07-13 ENCOUNTER — Other Ambulatory Visit: Payer: Self-pay | Admitting: Family Medicine

## 2021-07-13 DIAGNOSIS — F200 Paranoid schizophrenia: Secondary | ICD-10-CM

## 2021-08-09 ENCOUNTER — Other Ambulatory Visit: Payer: Self-pay | Admitting: Family Medicine

## 2021-08-09 DIAGNOSIS — F1020 Alcohol dependence, uncomplicated: Secondary | ICD-10-CM

## 2021-08-09 DIAGNOSIS — F411 Generalized anxiety disorder: Secondary | ICD-10-CM

## 2021-08-09 NOTE — Telephone Encounter (Signed)
Requested medications are due for refill today.  yes  Requested medications are on the active medications list.  yes  Last refill. 08/05/2020  Future visit scheduled.   no  Notes to clinic.  Pt was to have a f/u appointment 09/05/2020. Pt is more than 3 months overdue for OV.

## 2021-09-16 ENCOUNTER — Encounter: Payer: Self-pay | Admitting: Primary Care

## 2021-09-16 ENCOUNTER — Ambulatory Visit (INDEPENDENT_AMBULATORY_CARE_PROVIDER_SITE_OTHER): Payer: Medicare Other | Admitting: Primary Care

## 2021-09-16 ENCOUNTER — Other Ambulatory Visit: Payer: Self-pay

## 2021-09-16 DIAGNOSIS — R400 Somnolence: Secondary | ICD-10-CM

## 2021-09-16 MED ORDER — TRELEGY ELLIPTA 200-62.5-25 MCG/INH IN AEPB: 200.0000 ug | INHALATION_SPRAY | Freq: Every day | RESPIRATORY_TRACT | 11 refills | Status: DC

## 2021-09-16 NOTE — Patient Instructions (Signed)
No changes today Getting home sleep study to evaluate for sleep apnea  Follow up in 6 months

## 2021-09-16 NOTE — Progress Notes (Signed)
Virtual Visit via Telephone Note  I connected with Rodney [name] on 09/16/21 at  3:00 PM EDT by telephone and verified that I am speaking with the correct person using two identifiers.  Location: Patient: Home Provider: Office   I discussed the limitations, risks, security and privacy concerns of performing an evaluation and management service by telephone and the availability of in person appointments. I also discussed with the patient that there may be a patient responsible charge related to this service. The patient expressed understanding and agreed to proceed.   History of Present Illness: 35 year old male, current everyday smoker (40 pack year hx).  Past medical history significant for asthma/COPD, allergic rhinitis, schizophrenia, tobacco use disorder. Patient of Dr. Jayme Cloud, last seen on 03/10/21.   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  He had been lost to follow-up due to the COVID-19 pandemic and he had not been seen since December 2020.  This is a routine follow-up.  He has been doing well with Trelegy Ellipta and would like a prescription for the same.  During his last visit we had tried him on Dymista for his perennial rhinitis symptoms.  He does note that this helps however it does dry his nose too much and he stops using it.  Once he stops using it then his nasal congestion symptoms return.  He has been able to perform his activities of daily living and his work without dyspnea once he was started on the Trelegy.  He has no other complaint today.  He continues to smoke 1-1/2 packs of cigarettes per day, we discussed strategies to quit smoking at length.   09/16/2021- interim hx  Patient contacted today for 6 month follow-up. Breathing is stable, he gets winded with moderate exertion. He reports overall improvement in his breathing with Trelegy 200.  Continues to have PND symptoms but he has less side effects with Nasacort. He is concerned about possibly having sleep apnea. His fiance has told him that he stops breathing at night and reports loud snoring. CAT 17/40.    Observations/Objective:  - Able to speak in full sentences; no overt shortness of breath or cough   Assessment and Plan:  COPD/asthma: - Stable interval, no acute respiratory complaints  - Continue Trelegy one puff daily  - Smoking cessation encourage  Allergic rhinitis: - He is still having PND symptoms but is tolerating steroid nasal spray  - Continue Nasacort 1 spray twice daily as directed   Loud snoring: - Patient has symptoms of loud snoring and witnessed apnea. Epworth score 13/24  - Discussed risk of untreated sleep apnea including cardiovascular disease, afib, pulmonary HTN, stroke and DM. Treatment options include weight loss, side sleeping position, oral appliance, CPAP or referral to ENT.  - Recommend checking HST to evaluate for underlying sleep apnea   Tobacco abuse: - Current smoker, 1ppd. Advised he taper amount he is smoking and pick quit date. He has quit in the past using chantix, he will let us know if he wants to resume medication.   Follow Up Instructions:  - 6 month follow-up with Dr. Jayme Cloud  I discussed the assessment and treatment plan with the patient. The patient was provided an opportunity to ask questions and all were answered. The patient agreed with the plan and demonstrated an understanding of the instructions.   The patient was advised to call  back or seek an in-person evaluation if the symptoms worsen or if the condition fails to improve as anticipated.  I provided 30 minutes of non-face-to-face time during this encounter.   [name], NP

## 2021-09-20 ENCOUNTER — Ambulatory Visit (INDEPENDENT_AMBULATORY_CARE_PROVIDER_SITE_OTHER): Payer: Medicare Other

## 2021-09-20 VITALS — Ht 77.0 in | Wt 220.0 lb

## 2021-09-20 DIAGNOSIS — Z0000 Encounter for general adult medical examination without abnormal findings: Secondary | ICD-10-CM

## 2021-09-20 NOTE — Patient Instructions (Signed)
Rodney [name] , Thank you for taking time to come for your Medicare Wellness Visit. I appreciate your ongoing commitment to your health goals. Please review the following plan we discussed and let me know if I can assist you in the future.   Screening recommendations/referrals: Colonoscopy: n/a Recommended yearly ophthalmology/optometry visit for glaucoma screening and checkup Recommended yearly dental visit for hygiene and checkup  Vaccinations: Influenza vaccine: decline Pneumococcal vaccine: completed 08/09/2015 Tdap vaccine: completed 07/19/2020, due 07/19/2030 Shingles vaccine: n/a   Covid-19: decline  Advanced directives: Advance directive discussed with you today.   Conditions/risks identified: smoking  Next appointment: Follow up in one year for your annual wellness visit   Preventive Care 40-64 Years, Male Preventive care refers to lifestyle choices and visits with your health care provider that can promote health and wellness. What does preventive care include? A yearly physical exam. This is also called an annual well check. Dental exams once or twice a year. Routine eye exams. Ask your health care provider how often you should have your eyes checked. Personal lifestyle choices, including: Daily care of your teeth and gums. Regular physical activity. Eating a healthy diet. Avoiding tobacco and drug use. Limiting alcohol use. Practicing safe sex. Taking low-dose aspirin every day starting at age 59. What happens during an annual well check? The services and screenings done by your health care provider during your annual well check will depend on your age, overall health, lifestyle risk factors, and family history of disease. Counseling  Your health care provider may ask you questions about your: Alcohol use. Tobacco use. Drug use. Emotional well-being. Home and relationship well-being. Sexual activity. Eating habits. Work and work Astronomer. Screening  You may have  the following tests or measurements: Height, weight, and BMI. Blood pressure. Lipid and cholesterol levels. These may be checked every 5 years, or more frequently if you are over 37 years old. Skin check. Lung cancer screening. You may have this screening every year starting at age 27 if you have a 30-pack-year history of smoking and currently smoke or have quit within the past 15 years. Fecal occult blood test (FOBT) of the stool. You may have this test every year starting at age 33. Flexible sigmoidoscopy or colonoscopy. You may have a sigmoidoscopy every 5 years or a colonoscopy every 10 years starting at age 53. Prostate cancer screening. Recommendations will vary depending on your family history and other risks. Hepatitis C blood test. Hepatitis B blood test. Sexually transmitted disease (STD) testing. Diabetes screening. This is done by checking your blood sugar (glucose) after you have not eaten for a while (fasting). You may have this done every 1-3 years. Discuss your test results, treatment options, and if necessary, the need for more tests with your health care provider. Vaccines  Your health care provider may recommend certain vaccines, such as: Influenza vaccine. This is recommended every year. Tetanus, diphtheria, and acellular pertussis (Tdap, Td) vaccine. You may need a Td booster every 10 years. Zoster vaccine. You may need this after age 46. Pneumococcal 13-valent conjugate (PCV13) vaccine. You may need this if you have certain conditions and have not been vaccinated. Pneumococcal polysaccharide (PPSV23) vaccine. You may need one or two doses if you smoke cigarettes or if you have certain conditions. Talk to your health care provider about which screenings and vaccines you need and how often you need them. This information is not intended to replace advice given to you by your health care provider. Make sure you discuss  any questions you have with your health care  provider. Document Released: 01/07/2016 Document Revised: 08/30/2016 Document Reviewed: 10/12/2015 Elsevier Interactive Patient Education  2017 [place] Prevention in the Home Falls can cause injuries. They can happen to people of all ages. There are many things you can do to make your home safe and to help prevent falls. What can I do on the outside of my home? Regularly fix the edges of walkways and driveways and fix any cracks. Remove anything that might make you trip as you walk through a door, such as a raised step or threshold. Trim any bushes or trees on the path to your home. Use bright outdoor lighting. Clear any walking paths of anything that might make someone trip, such as rocks or tools. Regularly check to see if handrails are loose or broken. Make sure that both sides of any steps have handrails. Any raised decks and porches should have guardrails on the edges. Have any leaves, snow, or ice cleared regularly. Use sand or salt on walking paths during winter. Clean up any spills in your garage right away. This includes oil or grease spills. What can I do in the bathroom? Use night lights. Install grab bars by the toilet and in the tub and shower. Do not use towel bars as grab bars. Use non-skid mats or decals in the tub or shower. If you need to sit down in the shower, use a plastic, non-slip stool. Keep the floor dry. Clean up any water that spills on the floor as soon as it happens. Remove soap buildup in the tub or shower regularly. Attach bath mats securely with double-sided non-slip rug tape. Do not have throw rugs and other things on the floor that can make you trip. What can I do in the bedroom? Use night lights. Make sure that you have a light by your bed that is easy to reach. Do not use any sheets or blankets that are too big for your bed. They should not hang down onto the floor. Have a firm chair that has side arms. You can use this for support while  you get dressed. Do not have throw rugs and other things on the floor that can make you trip. What can I do in the kitchen? Clean up any spills right away. Avoid walking on wet floors. Keep items that you use a lot in easy-to-reach places. If you need to reach something above you, use a strong step stool that has a grab bar. Keep electrical cords out of the way. Do not use floor polish or wax that makes floors slippery. If you must use wax, use non-skid floor wax. Do not have throw rugs and other things on the floor that can make you trip. What can I do with my stairs? Do not leave any items on the stairs. Make sure that there are handrails on both sides of the stairs and use them. Fix handrails that are broken or loose. Make sure that handrails are as long as the stairways. Check any carpeting to make sure that it is firmly attached to the stairs. Fix any carpet that is loose or worn. Avoid having throw rugs at the top or bottom of the stairs. If you do have throw rugs, attach them to the floor with carpet tape. Make sure that you have a light switch at the top of the stairs and the bottom of the stairs. If you do not have them, ask someone to add  them for you. What else can I do to help prevent falls? Wear shoes that: Do not have high heels. Have rubber bottoms. Are comfortable and fit you well. Are closed at the toe. Do not wear sandals. If you use a stepladder: Make sure that it is fully opened. Do not climb a closed stepladder. Make sure that both sides of the stepladder are locked into place. Ask someone to hold it for you, if possible. Clearly mark and make sure that you can see: Any grab bars or handrails. First and last steps. Where the edge of each step is. Use tools that help you move around (mobility aids) if they are needed. These include: Canes. Walkers. Scooters. Crutches. Turn on the lights when you go into a dark area. Replace any light bulbs as soon as they burn  out. Set up your furniture so you have a clear path. Avoid moving your furniture around. If any of your floors are uneven, fix them. If there are any pets around you, be aware of where they are. Review your medicines with your doctor. Some medicines can make you feel dizzy. This can increase your chance of falling. Ask your doctor what other things that you can do to help prevent falls. This information is not intended to replace advice given to you by your health care provider. Make sure you discuss any questions you have with your health care provider. Document Released: 10/07/2009 Document Revised: 05/18/2016 Document Reviewed: 01/15/2015 Elsevier Interactive Patient Education  2017 [employer].

## 2021-09-20 NOTE — Progress Notes (Signed)
I connected with Rodney [name] today by telephone and verified that I am speaking with the correct person using two identifiers. Location patient: home Location provider: work Persons participating in the virtual visit: Rodney, [name] LPN.   I discussed the limitations, risks, security and privacy concerns of performing an evaluation and management service by telephone and the availability of in person appointments. I also discussed with the patient that there may be a patient responsible charge related to this service. The patient expressed understanding and verbally consented to this telephonic visit.    Interactive audio and video telecommunications were attempted between this provider and patient, however failed, due to patient having technical difficulties OR patient did not have access to video capability.  We continued and completed visit with audio only.     Vital signs may be patient reported or missing.  Subjective:   Rodney [name] is a 35 y.o. male who presents for Medicare Annual/Subsequent preventive examination.  Review of Systems     Cardiac Risk Factors include: male gender;smoking/ tobacco exposure     Objective:    Today's Vitals   09/20/21 1516  Weight: 220 lb (99.8 kg)  Height: 6\' 5"  (1.956 m)   Body mass index is 26.09 kg/m.  Advanced Directives 09/20/2021 02/18/2019  Does Patient Have a Medical Advance Directive? No No  Would patient like information on creating a medical advance directive? - No - Patient declined  Some encounter information is confidential and restricted. Go to Review Flowsheets activity to see all data.    Current Medications (verified) Outpatient Encounter Medications as of 09/20/2021  Medication Sig   benztropine (COGENTIN) 1 MG tablet TAKE 1 TABLET (1 MG TOTAL) BY MOUTH 2 (TWO) TIMES DAILY. FOR SIDE EFFECTS OF RISPERIDONE   divalproex (DEPAKOTE ER) 500 MG 24 hr tablet Take 2 tablets (1,000 mg total) by mouth at bedtime.    Fluticasone-Umeclidin-Vilant (TRELEGY ELLIPTA) 200-62.5-25 MCG/INH AEPB Inhale 200 mcg into the lungs daily.   gabapentin (NEURONTIN) 600 MG tablet TAKE 1 TABLET BY MOUTH THREE TIMES A DAY   PROAIR HFA 108 (90 Base) MCG/ACT inhaler INHALE 2 PUFFS INTO THE LUNGS EVERY 4 HOURS AS NEEDED FOR WHEEZING OR SHORTNESS OF BREATH (COUGH).   risperiDONE (RISPERDAL) 2 MG tablet TAKE 1 TABLET (2 MG TOTAL) BY MOUTH AT BEDTIME.   triamcinolone (NASACORT) 55 MCG/ACT AERO nasal inhaler Place 1 spray into the nose 2 (two) times daily.   dicyclomine (BENTYL) 10 MG capsule Take 1 capsule (10 mg total) by mouth 4 (four) times daily -  before meals and at bedtime. (Patient not taking: Reported on 09/20/2021)   ondansetron (ZOFRAN ODT) 4 MG disintegrating tablet Take 1 tablet (4 mg total) by mouth every 8 (eight) hours as needed for nausea or vomiting. (Patient not taking: Reported on 09/20/2021)   No facility-administered encounter medications on file as of 09/20/2021.    Allergies (verified) Hydroxyzine hcl   History: Past Medical History:  Diagnosis Date   Anxiety    Bipolar disorder (HCC)    CIGARETTE SMOKER 02/08/2011   Qualifier: Diagnosis of  By: 02/10/2011 MD, [place]     Seizures [facility])    Past Surgical History:  Procedure Laterality Date   WISDOM TOOTH EXTRACTION     Family History  Problem Relation Age of Onset   Anxiety disorder Mother    OCD Brother    Anxiety disorder Maternal Aunt    Anxiety disorder Maternal Grandmother    Anxiety disorder Paternal Grandfather  Social History   Socioeconomic History   Marital status: Significant Other    Spouse name: Not on file   Number of children: 0   Years of education: Not on file   Highest education level: Associate degree: occupational, Scientist, product/process development, or vocational program  Occupational History   Occupation: Location manager     Comment: [employer]   Tobacco Use   Smoking status: Every Day    Packs/day: 1.50    Years: 20.00    Pack years:  30.00    Types: Cigarettes   Smokeless tobacco: Never   Tobacco comments:    1-1.5PPD 03/10/2021  Vaping Use   Vaping Use: Former  Substance and Sexual Activity   Alcohol use: Yes    Comment: 6 drinks a day   Drug use: Not Currently    Types: Marijuana, Other-see comments, LSD    Comment: in past   Sexual activity: Yes  Other Topics Concern   Not on file  Social History Narrative   Not on file   Social Determinants of Health   Financial Resource Strain: Low Risk    Difficulty of Paying Living Expenses: Not hard at all  Food Insecurity: No Food Insecurity   Worried About Programme researcher, broadcasting/film/video in the Last Year: Never true   Ran Out of Food in the Last Year: Never true  Transportation Needs: No Transportation Needs   Lack of Transportation (Medical): No   Lack of Transportation (Non-Medical): No  Physical Activity: Inactive   Days of Exercise per Week: 0 days   Minutes of Exercise per Session: 0 min  Stress: No Stress Concern Present   Feeling of Stress : Not at all  Social Connections: Not on file    Tobacco Counseling Ready to quit: Yes Counseling given: Not Answered Tobacco comments: 1-1.5PPD 03/10/2021   Clinical Intake:  Pre-visit preparation completed: Yes  Pain : No/denies pain     Nutritional Status: BMI 25 -29 Overweight Nutritional Risks: None Diabetes: No  How often do you need to have someone help you when you read instructions, pamphlets, or other written materials from your doctor or pharmacy?: 1 - Never What is the last grade level you completed in school?: associates degree  Diabetic? no  Interpreter Needed?: No  Information entered by :: NAllen LPN   Activities of Daily Living In your present state of health, do you have any difficulty performing the following activities: 09/20/2021  Hearing? N  Vision? Y  Difficulty concentrating or making decisions? Y  Walking or climbing stairs? N  Dressing or bathing? N  Doing errands, shopping? N   Preparing Food and eating ? N  Using the Toilet? N  In the past six months, have you accidently leaked urine? N  Do you have problems with loss of bowel control? N  Managing your Medications? N  Managing your Finances? N  Housekeeping or managing your Housekeeping? N  Some recent data might be hidden    Patient Care Team: Smitty Cords, DO as PCP - General (Family Medicine)  Indicate any recent Medical Services you may have received from other than [facility] providers in the past year (date may be approximate).     Assessment:   This is a routine wellness examination for [place].  Hearing/Vision screen No results found.  Dietary issues and exercise activities discussed: Current Exercise Habits: The patient has a physically strenuous job, but has no regular exercise apart from work.   Goals Addressed  This Visit's Progress    Patient Stated       09/20/2021, wants to cut back on drinking and then smoking       Depression Screen PHQ 2/9 Scores 09/20/2021 03/05/2020 01/27/2020 08/29/2019 02/18/2019 08/28/2018 04/09/2018  PHQ - 2 Score 0 0 0 0 0 0 0  PHQ- 9 Score - - - 0 - - 0    Fall Risk Fall Risk  09/20/2021 03/05/2020 01/27/2020 02/18/2019 02/18/2019  Falls in the past year? 0 0 0 0 0  Number falls in past yr: - 0 0 - -  Injury with Fall? - 0 0 - -  Risk for fall due to : Medication side effect - - - -  Follow up Falls evaluation completed;Education provided;Falls prevention discussed Falls evaluation completed Falls evaluation completed Falls evaluation completed -    FALL RISK PREVENTION PERTAINING TO THE HOME:  Any stairs in or around the home? Yes  If so, are there any without handrails? No  Home free of loose throw rugs in walkways, pet beds, electrical cords, etc? Yes  Adequate lighting in your home to reduce risk of falls? Yes   ASSISTIVE DEVICES UTILIZED TO PREVENT FALLS:  Life alert? No  Use of a cane, walker or w/c? No  Grab bars in the  bathroom? No  Shower chair or bench in shower? Yes  Elevated toilet seat or a handicapped toilet? No   TIMED UP AND GO:  Was the test performed? No .       Cognitive Function:     6CIT Screen 09/20/2021 02/18/2019  What Year? 0 points 0 points  What month? 0 points 0 points  What time? 0 points 0 points  Count back from 20 0 points 0 points  Months in reverse 0 points 0 points  Repeat phrase 4 points 0 points  Total Score 4 0    Immunizations Immunization History  Administered Date(s) Administered   Pneumococcal Polysaccharide-23 08/09/2015   Tdap 07/19/2020    TDAP status: Up to date  Flu Vaccine status: Declined, Education has been provided regarding the importance of this vaccine but patient still declined. Advised may receive this vaccine at local pharmacy or Health Dept. Aware to provide a copy of the vaccination record if obtained from local pharmacy or Health Dept. Verbalized acceptance and understanding.  Pneumococcal vaccine status: Up to date  Covid-19 vaccine status: Declined, Education has been provided regarding the importance of this vaccine but patient still declined. Advised may receive this vaccine at local pharmacy or Health Dept.or vaccine clinic. Aware to provide a copy of the vaccination record if obtained from local pharmacy or Health Dept. Verbalized acceptance and understanding.  Qualifies for Shingles Vaccine? No   Zostavax completed No   Shingrix Completed?: n/a  Screening Tests Health Maintenance  Topic Date Due   Hepatitis C Screening  Never done   COVID-19 Vaccine (1) 10/02/2021 (Originally 04/11/1987)   INFLUENZA VACCINE  03/24/2022 (Originally 07/25/2021)   TETANUS/TDAP  07/19/2030   HIV Screening  Completed   HPV VACCINES  Aged Out    Health Maintenance  Health Maintenance Due  Topic Date Due   Hepatitis C Screening  Never done    Colorectal cancer screening: n/a  Lung Cancer Screening: (Low Dose CT Chest recommended if Age  19-80 years, 30 pack-year currently smoking OR have quit w/in 15years.) does not qualify.   Lung Cancer Screening Referral: no  Additional Screening:  Hepatitis C Screening: does qualify;   Vision  Screening: Recommended annual ophthalmology exams for early detection of glaucoma and other disorders of the eye. Is the patient up to date with their annual eye exam?  Yes  Who is the provider or what is the name of the office in which the patient attends annual eye exams? [facility] If pt is not established with a provider, would they like to be referred to a provider to establish care? No .   Dental Screening: Recommended annual dental exams for proper oral hygiene  Community Resource Referral / Chronic Care Management: CRR required this visit?  No   CCM required this visit?  No      Plan:     I have personally reviewed and noted the following in the patient's chart:   Medical and social history Use of alcohol, tobacco or illicit drugs  Current medications and supplements including opioid prescriptions. Patient is not currently taking opioid prescriptions. Functional ability and status Nutritional status Physical activity Advanced directives List of other physicians Hospitalizations, surgeries, and ER visits in previous 12 months Vitals Screenings to include cognitive, depression, and falls Referrals and appointments  In addition, I have reviewed and discussed with patient certain preventive protocols, quality metrics, and best practice recommendations. A written personalized care plan for preventive services as well as general preventive health recommendations were provided to patient.     Barb Merino, LPN   1/63/[id number]   Nurse Notes:

## 2021-09-30 NOTE — Progress Notes (Signed)
Agree with the details of the visit as noted by Elizabeth Walsh, NP.  C. Laura Mickaela Starlin, MD [facility] 

## 2021-10-08 ENCOUNTER — Other Ambulatory Visit: Payer: Self-pay | Admitting: Family Medicine

## 2021-10-08 DIAGNOSIS — F200 Paranoid schizophrenia: Secondary | ICD-10-CM

## 2021-10-08 NOTE — Telephone Encounter (Signed)
Requested medication (s) are due for refill today: yes  Requested medication (s) are on the active medication list: yes  Last refill:  06/30/21 #180  Future visit scheduled: no  Notes to clinic:  overdue for lab work    Requested Prescriptions  Pending Prescriptions Disp Refills   divalproex (DEPAKOTE ER) 500 MG 24 hr tablet [Pharmacy Med Name: DIVALPROEX SOD ER 500 MG TAB] 180 tablet 0    Sig: Take 2 tablets (1,000 mg total) by mouth at bedtime.     Not Delegated - Neurology:  Anticonvulsants - Valproates Failed - 10/08/2021 10:30 AM      Failed - This refill cannot be delegated      Failed - AST in normal range and within 360 days    AST  Date Value Ref Range Status  07/21/2020 19 10 - 40 U/L Final          Failed - ALT in normal range and within 360 days    ALT  Date Value Ref Range Status  07/21/2020 21 9 - 46 U/L Final          Failed - HGB in normal range and within 360 days    Hemoglobin  Date Value Ref Range Status  07/21/2020 15.2 13.2 - 17.1 g/dL Final          Failed - PLT in normal range and within 360 days    Platelets  Date Value Ref Range Status  07/21/2020 284 140 - 400 Thousand/uL Final          Failed - WBC in normal range and within 360 days    WBC  Date Value Ref Range Status  07/21/2020 5.5 3.8 - 10.8 Thousand/uL Final          Failed - HCT in normal range and within 360 days    HCT  Date Value Ref Range Status  07/21/2020 46.1 38.5 - 50.0 % Final          Failed - Valproic Acid (serum) in normal range and within 360 days    Valproic Acid Lvl  Date Value Ref Range Status  03/04/2020 84.0 50.0 - 100.0 mg/L Final          Passed - Valid encounter within last 12 months    Recent Outpatient Visits           3 months ago LUQ abdominal pain   [facility] Smitty Cords, DO   6 months ago Viral upper respiratory tract infection   [facility] Gabriel Cirri, NP   1 year ago Left flank  pain   [facility] Smitty Cords, DO   1 year ago Urinary frequency   [facility] Smitty Cords, DO   1 year ago Annual physical exam   [facility] Smitty Cords, DO       Future Appointments             In 11 months [facility], [facility]

## 2021-10-11 ENCOUNTER — Other Ambulatory Visit: Payer: Self-pay | Admitting: Family Medicine

## 2021-10-11 DIAGNOSIS — F200 Paranoid schizophrenia: Secondary | ICD-10-CM

## 2021-10-11 NOTE — Telephone Encounter (Signed)
Requested medications are due for refill today.  yes  Requested medications are on the active medications list.  yes  Last refill. 07/13/2021  Future visit scheduled.   In 2023  Notes to clinic.  Unable to detrmine the last time pt was seen for this issue.

## 2021-10-17 ENCOUNTER — Other Ambulatory Visit: Payer: Self-pay

## 2021-10-17 ENCOUNTER — Ambulatory Visit (INDEPENDENT_AMBULATORY_CARE_PROVIDER_SITE_OTHER): Payer: Medicare Other | Admitting: Family Medicine

## 2021-10-17 ENCOUNTER — Encounter: Payer: Self-pay | Admitting: Family Medicine

## 2021-10-17 VITALS — BP 135/79 | HR 75 | Temp 98.1°F | Ht 77.0 in | Wt 223.8 lb

## 2021-10-17 DIAGNOSIS — J449 Chronic obstructive pulmonary disease, unspecified: Secondary | ICD-10-CM | POA: Diagnosis not present

## 2021-10-17 DIAGNOSIS — R509 Fever, unspecified: Secondary | ICD-10-CM

## 2021-10-17 DIAGNOSIS — J101 Influenza due to other identified influenza virus with other respiratory manifestations: Secondary | ICD-10-CM

## 2021-10-17 LAB — POCT INFLUENZA A/B
Influenza A, POC: POSITIVE — AB
Influenza B, POC: NEGATIVE

## 2021-10-17 MED ORDER — XOFLUZA (80 MG DOSE) 2 X 40 MG PO TBPK: 80.0000 mg | ORAL_TABLET | Freq: Once | ORAL | 0 refills | Status: AC

## 2021-10-17 MED ORDER — PREDNISONE 50 MG PO TABS: 50.0000 mg | ORAL_TABLET | Freq: Every day | ORAL | 0 refills | Status: DC

## 2021-10-17 MED ORDER — OSELTAMIVIR PHOSPHATE 75 MG PO CAPS: 75.0000 mg | ORAL_CAPSULE | Freq: Two times a day (BID) | ORAL | 0 refills | Status: DC

## 2021-10-17 MED ORDER — IPRATROPIUM BROMIDE 0.06 % NA SOLN: 2.0000 | Freq: Four times a day (QID) | NASAL | 0 refills | Status: DC

## 2021-10-17 NOTE — Patient Instructions (Signed)
1. You tested positive for Influenza A today (rapid flu swab test in office)  - Start Flu medicine today - Based on insurance / cost coverage - either Tamiflu (anti-flu medicine) take one capsule 76m twice a day for 5 days OR Xofluza 2 pills back to back at one time (one day and then done)  For asthma/COPD  Start Prednisone burst for cough for 5 days.  Use Debrox OTC ear wax kit drops and syringe flush to remove excess ear wax build up in R ear, cannot see past today  -- For symptom control (these are optional OTC medicines)      - Take Ibuprofen / Advil [id number] 6-8 hours as needed for fever / muscle aches, and may also take Tylenol [phone] dose every 6-8 hours or 3 times a day, can alternate dosing     - May try OTC Mucinex up to 7-10 days then stop  If prescribed for you      - Start Tessalon perls one every 8 hours or 3 times a day as needed for cough      - Start Atrovent nasal spray decongestant 2 sprays in each nostril up to 4 times daily for 7 days  - Wash hands and cover cough very well to avoid spread of infection - Improve hydration with plenty of clear fluids    If significant worsening with poor fluid intake, worsening fever, difficulty breathing due to coughing, worsening body aches, weakness, or other more concerning symptoms difficulty breathing you can seek treatment at Emergency Department. Also if improved flu symptoms and then worsening days to week later with concerns for bronchitis, productive cough fever chills again we may need to check for possible pneumonia that can occur after the flu         Please schedule a Follow-up Appointment to: Return if symptoms worsen or fail to improve.  If you have any other questions or concerns, please feel free to call the office or send a message through [email or web address] may also schedule an earlier appointment if necessary.  Additionally, you may be receiving a survey about your experience at our office within  a few days to 1 week by e-mail or mail. We value your feedback.  AleNobie PutnamO [place]

## 2021-10-17 NOTE — Progress Notes (Signed)
Subjective:    Patient ID: Rodney [name], male    DOB: January 26, 1986, 35 y.o.   MRN: [id number]  Rodney [name] is a 35 y.o. male presenting on 10/17/2021 for Cough, Ear Pain, and Chills  Patient presents for a same day appointment.  HPI  Cough, Congestion R Ear Pain Fever, Chills Asthma w/ COPD - bronchospasm  Recent onset within past 3-4 days, his children were sick and they were negative for covid, he now has symptoms. He has had coughing spells, and he uses Albuterol PRN, he continues Trelegy daily. No recent COVID or Flu Test. Has not had vaccination for Flu. No Documented COVID19 vaccine.  Admits R Ear pain, he has wax and had some drainage discharge out of ear asking about this. OTC meds   Denies any nausea vomiting abdominal pain diarrhea   Depression screen [facility] 2/9 09/20/2021 03/05/2020 01/27/2020  Decreased Interest 0 0 0  Down, Depressed, Hopeless 0 0 0  PHQ - 2 Score 0 0 0  Altered sleeping - - -  Tired, decreased energy - - -  Change in appetite - - -  Feeling bad or failure about yourself  - - -  Trouble concentrating - - -  Moving slowly or fidgety/restless - - -  Suicidal thoughts - - -  PHQ-9 Score - - -  Difficult doing work/chores - - -    Social History   Tobacco Use   Smoking status: Every Day    Packs/day: 1.50    Years: 20.00    Pack years: 30.00    Types: Cigarettes   Smokeless tobacco: Never   Tobacco comments:    1-1.5PPD 03/10/2021  Vaping Use   Vaping Use: Former  Substance Use Topics   Alcohol use: Yes    Comment: 6 drinks a day   Drug use: Not Currently    Types: Marijuana, Other-see comments, LSD    Comment: in past    Review of Systems Per HPI unless specifically indicated above     Objective:    BP 135/79   Pulse 75   Temp 98.1 F (36.7 C) (Oral)   Ht 6\' 5"  (1.956 m)   Wt 223 lb 12.8 oz (101.5 kg)   SpO2 98%   BMI 26.54 kg/m   Wt Readings from Last 3 Encounters:  10/17/21 223 lb 12.8 oz (101.5 kg)  09/20/21 220 lb  (99.8 kg)  06/30/21 218 lb (98.9 kg)    Physical Exam Vitals and nursing note reviewed.  Constitutional:      General: He is not in acute distress.    Appearance: He is well-developed. He is not diaphoretic.     Comments: Well-appearing, comfortable, cooperative  HENT:     Head: Normocephalic and atraumatic.     Right Ear: There is impacted cerumen.     Left Ear: There is no impacted cerumen.     Ears:     Comments: Cannot view R TM Eyes:     General:        Right eye: No discharge.        Left eye: No discharge.     Conjunctiva/sclera: Conjunctivae normal.  Neck:     Thyroid: No thyromegaly.  Cardiovascular:     Rate and Rhythm: Normal rate and regular rhythm.     Pulses: Normal pulses.     Heart sounds: Normal heart sounds. No murmur heard. Pulmonary:     Effort: Pulmonary effort is normal. No respiratory distress.  Breath sounds: Wheezing present. No rales.  Musculoskeletal:        General: Normal range of motion.     Cervical back: Normal range of motion and neck supple.  Lymphadenopathy:     Cervical: No cervical adenopathy.  Skin:    General: Skin is warm and dry.     Findings: No erythema or rash.  Neurological:     Mental Status: He is alert and oriented to person, place, and time. Mental status is at baseline.  Psychiatric:        Behavior: Behavior normal.     Comments: Well groomed, good eye contact, normal speech and thoughts      Results for orders placed or performed in visit on 10/17/21  POCT Influenza A/B  Result Value Ref Range   Influenza A, POC Positive (A) Negative   Influenza B, POC Negative Negative      Assessment & Plan:   Problem List Items Addressed This Visit     Asthma with COPD (HCC)   Relevant Medications   predniSONE (DELTASONE) 50 MG tablet   ipratropium (ATROVENT) 0.06 % nasal spray   Other Visit Diagnoses     Influenza A    -  Primary   Relevant Medications   oseltamivir (TAMIFLU) 75 MG capsule   XOFLUZA, 80 MG  DOSE, 2 x 40 MG TBPK   ipratropium (ATROVENT) 0.06 % nasal spray   Fever and chills       Relevant Orders   POCT Influenza A/B (Completed)       Clinically consistent with flu and confirmed Influenza A today on rapid flu test  - Duration x 3 days, without complication but has asthma at risk of flare with some bronchospasm Tolerating PO and well hydrated - No other focal findings of infection today - Did not receive influenza vaccine this season  Plan: 1. Start Tamiflu 75mg  capsules BID x 5 days, also provided additional rx for printed Xofluza if he can get this one covered can use this instead for better option. Only use ONE option 2. Supportive care as advised with NSAID / Tylenol PRN fever/myalgias, improve hydration, may take OTC Cold/Flu meds Start Atrovent nasal spray decongestant 2 sprays in each nostril up to 4 times daily for 7 days Prednisone 50 mg daily x 5 days for burst / asthma flare  Return criteria given if significant worsening, consider post-influenza complications, otherwise follow-up if needed  R Ear impacted wax cerumen, will see if resolves ear/sinus symptoms w/ flu treatment otherwise he can try debrox wax treatment if unresolved and Flu resolved we can do ear wax flushing here, or can contact back in1 week if still sinus symptoms maybe cover with empiric antibiotic for possible ear infection if still problematic R ear.   Meds ordered this encounter  Medications   oseltamivir (TAMIFLU) 75 MG capsule    Sig: Take 1 capsule (75 mg total) by mouth 2 (two) times daily. For 5 days    Dispense:  10 capsule    Refill:  0   XOFLUZA, 80 MG DOSE, 2 x 40 MG TBPK    Sig: Take 80 mg by mouth once for 1 dose. For Flu    Dispense:  1 each    Refill:  0   predniSONE (DELTASONE) 50 MG tablet    Sig: Take 1 tablet (50 mg total) by mouth daily with breakfast.    Dispense:  5 tablet    Refill:  0   ipratropium (ATROVENT)  0.06 % nasal spray    Sig: Place 2 sprays into both  nostrils 4 (four) times daily. For up to 5-7 days then stop.    Dispense:  15 mL    Refill:  0     Follow up plan: Return if symptoms worsen or fail to improve.  Saralyn Pilar, DO [facility] 10/17/2021, 2:20 PM

## 2021-11-04 ENCOUNTER — Other Ambulatory Visit: Payer: Self-pay

## 2021-11-04 ENCOUNTER — Ambulatory Visit: Payer: Medicare Other

## 2021-11-04 DIAGNOSIS — R400 Somnolence: Secondary | ICD-10-CM

## 2021-11-04 DIAGNOSIS — R0683 Snoring: Secondary | ICD-10-CM | POA: Diagnosis not present

## 2021-11-09 DIAGNOSIS — G479 Sleep disorder, unspecified: Secondary | ICD-10-CM | POA: Diagnosis not present

## 2021-11-09 DIAGNOSIS — G4710 Hypersomnia, unspecified: Secondary | ICD-10-CM | POA: Diagnosis not present

## 2021-11-09 DIAGNOSIS — R0683 Snoring: Secondary | ICD-10-CM | POA: Diagnosis not present

## 2021-11-21 ENCOUNTER — Other Ambulatory Visit: Payer: Self-pay | Admitting: Family Medicine

## 2021-11-21 DIAGNOSIS — F411 Generalized anxiety disorder: Secondary | ICD-10-CM

## 2021-11-21 DIAGNOSIS — F1020 Alcohol dependence, uncomplicated: Secondary | ICD-10-CM

## 2021-11-21 NOTE — Telephone Encounter (Signed)
Copied from CRM [id number]. Topic: Quick Communication - Rx Refill/Question >> Nov 21, 2021  1:20 PM Marylen Ponto wrote: Medication: gabapentin (NEURONTIN) 600 MG tablet  Has the patient contacted their pharmacy? Yes.   (Agent: If no, request that the patient contact the pharmacy for the refill. If patient does not wish to contact the pharmacy document the reason why and proceed with request.) (Agent: If yes, when and what did the pharmacy advise?)  Preferred Pharmacy (with phone number or street name): [facility] [phone] [place], [place] - [address] Phone: [phone]   Fax: [phone]  Has the patient been seen for an appointment in the last year OR does the patient have an upcoming appointment? Yes.    Agent: Please be advised that RX refills may take up to 3 business days. We ask that you follow-up with your pharmacy.

## 2021-11-22 MED ORDER — GABAPENTIN 600 MG PO TABS: 600.0000 mg | ORAL_TABLET | Freq: Three times a day (TID) | ORAL | 0 refills | Status: DC

## 2021-11-22 NOTE — Telephone Encounter (Signed)
Requested Prescriptions  Pending Prescriptions Disp Refills  . gabapentin (NEURONTIN) 600 MG tablet 270 tablet 0    Sig: Take 1 tablet (600 mg total) by mouth 3 (three) times daily.     Neurology: Anticonvulsants - gabapentin Passed - 11/21/2021  8:19 PM      Passed - Valid encounter within last 12 months    Recent Outpatient Visits          1 month ago Influenza A   [facility], Netta Neat, DO   4 months ago LUQ abdominal pain   [facility] Smitty Cords, DO   7 months ago Viral upper respiratory tract infection   [facility] Gabriel Cirri, NP   1 year ago Left flank pain   [facility] Smitty Cords, DO   1 year ago Urinary frequency   [facility] Smitty Cords, DO      Future Appointments            In 10 months [facility], [facility]

## 2021-12-25 ENCOUNTER — Other Ambulatory Visit: Payer: Self-pay | Admitting: Family Medicine

## 2021-12-25 DIAGNOSIS — F411 Generalized anxiety disorder: Secondary | ICD-10-CM

## 2021-12-27 NOTE — Telephone Encounter (Signed)
Requested medication (s) are due for refill today:   Provider to review  Requested medication (s) are on the active medication list:   Yes  Future visit scheduled:   No   Last ordered: 06/13/2021 #90, 1 refill  Non delegated refill   Requested Prescriptions  Pending Prescriptions Disp Refills   risperiDONE (RISPERDAL) 2 MG tablet [Pharmacy Med Name: RISPERIDONE 2 MG TABLET] 90 tablet 1    Sig: TAKE 1 TABLET BY MOUTH AT BEDTIME.     Not Delegated - Psychiatry:  Antipsychotics - Second Generation (Atypical) - risperidone Failed - 12/25/2021  1:45 AM      Failed - This refill cannot be delegated      Failed - Prolactin Level (serum) in normal range and within 180 days    No results found for: PROLACTIN, TOTPROLACTIN, LABPROL        Failed - ALT in normal range and within 180 days    ALT  Date Value Ref Range Status  07/21/2020 21 9 - 46 U/L Final          Failed - AST in normal range and within 180 days    AST  Date Value Ref Range Status  07/21/2020 19 10 - 40 U/L Final          Passed - Valid encounter within last 6 months    Recent Outpatient Visits           2 months ago Influenza A   [facility] [place], Netta Neat, DO   6 months ago LUQ abdominal pain   [facility] Smitty Cords, DO   8 months ago Viral upper respiratory tract infection   [facility] Gabriel Cirri, NP   1 year ago Left flank pain   [facility] Smitty Cords, DO   1 year ago Urinary frequency   [facility], Netta Neat, DO       Future Appointments             In 9 months [facility], [facility]

## 2022-01-04 ENCOUNTER — Telehealth: Payer: Self-pay | Admitting: Primary Care

## 2022-01-04 NOTE — Telephone Encounter (Signed)
Please make sure patient has a routine follow-up with Dr. Jayme Cloud in the books for his hx COPD/asthma. His sleep study this fall did not show evidence of OSA but he had mild nocturnal hypoxia d/t underlying chronic lung disease that needs to be follow-up on.

## 2022-01-05 NOTE — Telephone Encounter (Signed)
Lm for patient.   Please schedule appt with Dr. Jayme Cloud.

## 2022-01-06 NOTE — Telephone Encounter (Signed)
Spoke to patient and offered appt with Dr. Jayme Cloud.  He stated that he could not make an appt at this time. He will call back to schedule.  Will close encounter.  Routing to [employer] as an Financial planner

## 2022-01-07 ENCOUNTER — Other Ambulatory Visit: Payer: Self-pay | Admitting: Family Medicine

## 2022-01-07 DIAGNOSIS — F200 Paranoid schizophrenia: Secondary | ICD-10-CM

## 2022-01-07 NOTE — Telephone Encounter (Signed)
Requested Prescriptions  Pending Prescriptions Disp Refills   benztropine (COGENTIN) 1 MG tablet [Pharmacy Med Name: BENZTROPINE MES 1 MG TABLET] 180 tablet 0    Sig: TAKE 1 TABLET (1 MG TOTAL) BY MOUTH 2 (TWO) TIMES DAILY. FOR SIDE EFFECTS OF RISPERIDONE     Neurology: Parkinsonian Agents - benztropine mesylate Passed - 01/07/2022  6:52 AM      Passed - Last Heart Rate in normal range    Pulse Readings from Last 1 Encounters:  10/17/21 75         Passed - Valid encounter within last 12 months    Recent Outpatient Visits          2 months ago Influenza A   [facility] Smitty Cords, DO   6 months ago LUQ abdominal pain   [facility] Smitty Cords, DO   9 months ago Viral upper respiratory tract infection   [facility] Gabriel Cirri, NP   1 year ago Left flank pain   [facility] Smitty Cords, DO   1 year ago Urinary frequency   [facility] Smitty Cords, DO      Future Appointments            In 8 months [facility], [facility]

## 2022-01-25 ENCOUNTER — Ambulatory Visit (INDEPENDENT_AMBULATORY_CARE_PROVIDER_SITE_OTHER): Payer: Medicare Other | Admitting: Family Medicine

## 2022-01-25 ENCOUNTER — Other Ambulatory Visit: Payer: Self-pay

## 2022-01-25 ENCOUNTER — Encounter: Payer: Self-pay | Admitting: Family Medicine

## 2022-01-25 VITALS — BP 133/74 | HR 70 | Ht 77.0 in | Wt 218.4 lb

## 2022-01-25 DIAGNOSIS — R0981 Nasal congestion: Secondary | ICD-10-CM | POA: Diagnosis not present

## 2022-01-25 DIAGNOSIS — J449 Chronic obstructive pulmonary disease, unspecified: Secondary | ICD-10-CM

## 2022-01-25 DIAGNOSIS — B349 Viral infection, unspecified: Secondary | ICD-10-CM | POA: Diagnosis not present

## 2022-01-25 DIAGNOSIS — R110 Nausea: Secondary | ICD-10-CM

## 2022-01-25 DIAGNOSIS — J4489 Other specified chronic obstructive pulmonary disease: Secondary | ICD-10-CM

## 2022-01-25 MED ORDER — ONDANSETRON 4 MG PO TBDP: 4.0000 mg | ORAL_TABLET | Freq: Three times a day (TID) | ORAL | 0 refills | Status: DC | PRN

## 2022-01-25 MED ORDER — IPRATROPIUM BROMIDE 0.06 % NA SOLN: 2.0000 | Freq: Four times a day (QID) | NASAL | 0 refills | Status: DC

## 2022-01-25 MED ORDER — PREDNISONE 50 MG PO TABS: 50.0000 mg | ORAL_TABLET | Freq: Every day | ORAL | 0 refills | Status: DC

## 2022-01-25 NOTE — Progress Notes (Signed)
Subjective:    Patient ID: Rodney [name], male    DOB: 1986-03-10, 36 y.o.   MRN: [id number]  Rodney [name] is a 36 y.o. male presenting on 01/25/2022 for Cough and Nasal Congestion   HPI  Acute Viral Syndrome Reports symptoms acute onset 4-5 days, he has done x 2 COVID home test negative. He has had sinus drainage cough and congestion Newborn child with runny nose at home. No other sick contacts Taking OTC DayQuil and NyQuil PRN with some relief, temporary Admits no fever registered. 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  Depression screen [facility] 2/9 01/25/2022 09/20/2021 03/05/2020  Decreased Interest 0 0 0  Down, Depressed, Hopeless 0 0 0  PHQ - 2 Score 0 0 0  Altered sleeping 0 - -  Tired, decreased energy 1 - -  Change in appetite 0 - -  Feeling bad or failure about yourself  0 - -  Trouble concentrating 0 - -  Moving slowly or fidgety/restless 0 - -  Suicidal thoughts 0 - -  PHQ-9 Score 1 - -  Difficult doing work/chores Not difficult at all - -    Social History   Tobacco Use   Smoking status: Every Day    Packs/day: 1.50    Years: 20.00    Pack years: 30.00    Types: Cigarettes   Smokeless tobacco: Never   Tobacco comments:    1-1.5PPD 03/10/2021  Vaping Use   Vaping Use: Former  Substance Use Topics   Alcohol use: Yes    Comment: 6 drinks a day   Drug use: Not Currently    Types: Marijuana, Other-see comments, LSD    Comment: in past    Review of Systems Per HPI unless specifically indicated above     Objective:    BP 133/74    Pulse 70    Ht 6\' 5"  (1.956 m)    Wt 218 lb 6.4 oz (99.1 kg)    SpO2 100%    BMI 25.90 kg/m   Wt Readings from Last 3 Encounters:  01/25/22 218 lb 6.4 oz (99.1 kg)  10/17/21 223 lb 12.8 oz  (101.5 kg)  09/20/21 220 lb (99.8 kg)    Physical Exam Vitals and nursing note reviewed.  Constitutional:      General: He is not in acute distress.    Appearance: He is well-developed. He is not diaphoretic.     Comments: Well-appearing, comfortable, cooperative  HENT:     Head: Normocephalic and atraumatic.  Eyes:     General:        Right eye: No discharge.        Left eye: No discharge.     Conjunctiva/sclera: Conjunctivae normal.  Neck:     Thyroid: No thyromegaly.  Cardiovascular:     Rate and Rhythm: Normal rate and regular rhythm.     Pulses: Normal pulses.     Heart sounds: Normal heart sounds. No murmur heard. Pulmonary:     Effort: Pulmonary effort is normal. No respiratory distress.     Breath sounds: Wheezing present. No rales.  Musculoskeletal:        General: Normal range of motion.     Cervical back: Normal range of motion and neck supple.  Lymphadenopathy:  Cervical: No cervical adenopathy.  Skin:    General: Skin is warm and dry.     Findings: No erythema or rash.  Neurological:     Mental Status: He is alert and oriented to person, place, and time. Mental status is at baseline.  Psychiatric:        Behavior: Behavior normal.     Comments: Well groomed, good eye contact, normal speech and thoughts     Results for orders placed or performed in visit on 10/17/21  POCT Influenza A/B  Result Value Ref Range   Influenza A, POC Positive (A) Negative   Influenza B, POC Negative Negative      Assessment & Plan:   Problem List Items Addressed This Visit     Asthma with COPD (HCC)   Relevant Medications   predniSONE (DELTASONE) 50 MG tablet   ondansetron (ZOFRAN-ODT) 4 MG disintegrating tablet   ipratropium (ATROVENT) 0.06 % nasal spray   Other Visit Diagnoses     Acute viral syndrome    -  Primary   Relevant Medications   ondansetron (ZOFRAN-ODT) 4 MG disintegrating tablet   Nausea       Relevant Medications   ipratropium (ATROVENT) 0.06 %  nasal spray   Sinus congestion           Acute viral syndrome Cannot rule out COVID, testing x 2 negative at home No confirm sick contacts w/ COVID or Flu  Day 4-5 now, discussed utility of testing and treatment otherwise we agree to treat symptoms  Complication with asthma/COPD  Start Prednisone if needed for cough chest tightness and body aches If you start this, stop ibuprofen aleve advil  After finished, can resume anti inflammatory of choice  Start Atrovent nasal spray decongestant 2 sprays in each nostril up to 4 times daily for 7 days  Zofran for nausea  Keep hydrated  If still bothering you may consider chest x-ray for lungs and or antibiotic if >7-10 days symptoms  Meds ordered this encounter  Medications   predniSONE (DELTASONE) 50 MG tablet    Sig: Take 1 tablet (50 mg total) by mouth daily with breakfast.    Dispense:  5 tablet    Refill:  0   ondansetron (ZOFRAN-ODT) 4 MG disintegrating tablet    Sig: Take 1 tablet (4 mg total) by mouth every 8 (eight) hours as needed for nausea or vomiting.    Dispense:  30 tablet    Refill:  0   ipratropium (ATROVENT) 0.06 % nasal spray    Sig: Place 2 sprays into both nostrils 4 (four) times daily. For up to 5-7 days then stop.    Dispense:  15 mL    Refill:  0     Follow up plan: Return if symptoms worsen or fail to improve.   Saralyn Pilar, DO [facility] 01/25/2022, 2:27 PM

## 2022-01-25 NOTE — Patient Instructions (Addendum)
Thank you for coming to the office today.  Start Prednisone if needed for cough chest tightness and body aches If you start this, stop ibuprofen aleve advil  After finished, can resume anti inflammatory of choice  Start Atrovent nasal spray decongestant 2 sprays in each nostril up to 4 times daily for 7 days  Zofran for nausea  Keep hydrated  If still bothering you may consider chest x-ray for lungs and or antibiotic if >7-10 days symptoms  Please schedule a Follow-up Appointment to: Return if symptoms worsen or fail to improve.  If you have any other questions or concerns, please feel free to call the office or send a message through MyChart. You may also schedule an earlier appointment if necessary.  Additionally, you may be receiving a survey about your experience at our office within a few days to 1 week by e-mail or mail. We value your feedback.  Saralyn Pilar, DO [facility], [place]

## 2022-02-16 ENCOUNTER — Other Ambulatory Visit: Payer: Self-pay | Admitting: Family Medicine

## 2022-02-16 DIAGNOSIS — R110 Nausea: Secondary | ICD-10-CM

## 2022-02-16 DIAGNOSIS — F411 Generalized anxiety disorder: Secondary | ICD-10-CM

## 2022-02-16 DIAGNOSIS — F1020 Alcohol dependence, uncomplicated: Secondary | ICD-10-CM

## 2022-02-16 NOTE — Telephone Encounter (Signed)
Requested medication (s) are due for refill today: yes neurontin  Requested medication (s) are on the active medication list: yes  Last refill:  atrovent nasal spray- 01/25/22 15 ml 0 refills, neurontin- 11/22/21 #270 0 refills  Future visit scheduled: yes 7 months  Notes to clinic:  atrovent tapered drug, do you want to refill Rx? Last CR labs 07/21/20. Do you want to refill neurontin ?     Requested Prescriptions  Pending Prescriptions Disp Refills   ipratropium (ATROVENT) 0.06 % nasal spray [Pharmacy Med Name: IPRATROPIUM 0.06% SPRAY]      Sig: Place 2 sprays into both nostrils 4 (four) times daily. For up to 5-7 days then stop.     Off-Protocol Failed - 02/16/2022 10:35 AM      Failed - Medication not assigned to a protocol, review manually.      Passed - Valid encounter within last 12 months    Recent Outpatient Visits           3 weeks ago Acute viral syndrome   [facility], Netta Neat, DO   4 months ago Influenza A   [facility] Smitty Cords, DO   7 months ago LUQ abdominal pain   [facility] Smitty Cords, DO   10 months ago Viral upper respiratory tract infection   [facility] Gabriel Cirri, NP   1 year ago Left flank pain   [facility] Smitty Cords, DO       Future Appointments             In 7 months [facility], [facility]            Off-Protocol Failed - 02/16/2022 10:35 AM      Failed - Medication not assigned to a protocol, review manually.      Passed - Valid encounter within last 12 months    Recent Outpatient Visits           3 weeks ago Acute viral syndrome   [facility] [place], Netta Neat, DO   4 months ago Influenza A   [facility] Smitty Cords, DO   7 months ago LUQ abdominal pain   [facility] Smitty Cords, DO   10 months ago  Viral upper respiratory tract infection   [facility] Gabriel Cirri, NP   1 year ago Left flank pain   [facility] [place], Netta Neat, DO       Future Appointments             In 7 months [facility]              gabapentin (NEURONTIN) 600 MG tablet [Pharmacy Med Name: GABAPENTIN 600 MG TABLET] 270 tablet 0    Sig: TAKE 1 TABLET BY MOUTH THREE TIMES A DAY     Neurology: Anticonvulsants - gabapentin Failed - 02/16/2022 10:35 AM      Failed - Cr in normal range and within 360 days    Creat  Date Value Ref Range Status  07/21/2020 0.81 0.60 - 1.35 mg/dL Final          Passed - Completed PHQ-2 or PHQ-9 in the last 360 days      Passed - Valid encounter within last 12 months    Recent Outpatient Visits  3 weeks ago Acute viral syndrome   [facility], Netta Neat, DO   4 months ago Influenza A   [facility] Smitty Cords, DO   7 months ago LUQ abdominal pain   [facility] Smitty Cords, DO   10 months ago Viral upper respiratory tract infection   [facility] Gabriel Cirri, NP   1 year ago Left flank pain   [facility] Smitty Cords, DO       Future Appointments             In 7 months [facility], [place]

## 2022-02-22 ENCOUNTER — Encounter: Payer: Self-pay | Admitting: Family Medicine

## 2022-02-22 ENCOUNTER — Telehealth (INDEPENDENT_AMBULATORY_CARE_PROVIDER_SITE_OTHER): Payer: Medicare Other | Admitting: Family Medicine

## 2022-02-22 VITALS — Wt 218.0 lb

## 2022-02-22 DIAGNOSIS — F200 Paranoid schizophrenia: Secondary | ICD-10-CM

## 2022-02-22 DIAGNOSIS — F411 Generalized anxiety disorder: Secondary | ICD-10-CM | POA: Diagnosis not present

## 2022-02-22 NOTE — Patient Instructions (Signed)
Referral sent to Dr Elna Breslow at [facility] ? ?Please schedule a Follow-up Appointment to: No follow-ups on file. ? ?If you have any other questions or concerns, please feel free to call the office or send a message through MyChart. You may also schedule an earlier appointment if necessary. ? ?Additionally, you may be receiving a survey about your experience at our office within a few days to 1 week by e-mail or mail. We value your feedback. ? ?Saralyn Pilar, DO ?[facility], [place] ?

## 2022-02-22 NOTE — Progress Notes (Signed)
? ?Subjective:  ? ? Patient ID: Rodney [name], male    DOB: Oct 09, 1986, 36 y.o.   MRN: [id number] ? ?Rodney [name] is a 36 y.o. male presenting on 02/22/2022 for Anxiety ? ?Virtual / Telehealth Encounter - Video Visit via MyChart ?The purpose of this virtual visit is to provide medical care while limiting exposure to the novel coronavirus (COVID19) for both patient and office staff. ? ?Consent was obtained for remote visit:  Yes.   ?Answered questions that patient had about telehealth interaction:  Yes.   ?I discussed the limitations, risks, security and privacy concerns of performing an evaluation and management service by video/telephone. I also discussed with the patient that there may be a patient responsible charge related to this service. The patient expressed understanding and agreed to proceed. ? ?Patient Location: Home ?Provider Location: [facility] (Office) ? ?Participants in virtual visit: ?- Patient: [name] ?- CMA: Orinda Kenner, CMA ?- Provider: Dr Parks Ranger ? ? ?HPI ? ? ?Schizophrenia / Generalized Anxiety ?No longer followed by Psychiatry since 2020 - Dr Shea Evans previously at [facility]. ?He continues on same therapy, had been successful previously, continues on Gabapentin, Depakote, Risperidone. ? ?He admits worsening anxiety lately with multiple stressors ? ?Admits some difficulty with Memory Loss, he has a 66 yr old child, and has new job next week ? ?He is requesting referral to return to [facility]ry / Therapist. ? ?Depression screen [facility] 2/9 02/22/2022 01/25/2022 09/20/2021  ?Decreased Interest 0 0 0  ?Down, Depressed, Hopeless 0 0 0  ?PHQ - 2 Score 0 0 0  ?Altered sleeping 0 0 -  ?Tired, decreased energy 1 1 -  ?Change in appetite 0 0 -  ?Feeling bad or failure about yourself  0 0 -  ?Trouble concentrating 0 0 -  ?Moving slowly or fidgety/restless 0 0 -  ?Suicidal thoughts 0 0 -  ?PHQ-9 Score 1 1 -  ?Difficult doing work/chores Not difficult at all Not difficult at all -  ? ? ?Social  History  ? ?Tobacco Use  ? Smoking status: Every Day  ?  Packs/day: 1.50  ?  Years: 20.00  ?  Pack years: 30.00  ?  Types: Cigarettes  ? Smokeless tobacco: Never  ? Tobacco comments:  ?  1-1.5PPD 03/10/2021  ?Vaping Use  ? Vaping Use: Former  ?Substance Use Topics  ? Alcohol use: Yes  ?  Comment: 6 drinks a day  ? Drug use: Not Currently  ?  Types: Marijuana, Other-see comments, LSD  ?  Comment: in past  ? ? ?Review of Systems ?Per HPI unless specifically indicated above ? ?   ?Objective:  ?  ?Wt 218 lb (98.9 kg)   BMI 25.85 kg/m?   ?Wt Readings from Last 3 Encounters:  ?02/22/22 218 lb (98.9 kg)  ?01/25/22 218 lb 6.4 oz (99.1 kg)  ?10/17/21 223 lb 12.8 oz (101.5 kg)  ?  ?Physical Exam ? ?Note examination was completely remotely via video observation objective data only ? ?Gen - well-appearing, no acute distress or apparent pain, comfortable ?HEENT - eyes appear clear without discharge or redness ?Heart/Lungs - cannot examine virtually - observed no evidence of coughing or labored breathing. ?Abd - cannot examine virtually  ?epigastric region ?Skin - face visible today- no rash ?Neuro - awake, alert, oriented ?Psych - not anxious appearing ? ? ?Results for orders placed or performed in visit on 10/17/21  ?POCT Influenza A/B  ?Result Value Ref Range  ?  Influenza A, POC Positive (A) Negative  ? Influenza B, POC Negative Negative  ? ?   ?Assessment & Plan:  ? ?Problem List Items Addressed This Visit   ? ? Paranoid schizophrenia ([place]) - Primary  ? Relevant Orders  ? Ambulatory referral to Psychiatry  ? GAD (generalized anxiety disorder)  ? Relevant Orders  ? Ambulatory referral to Psychiatry  ?  ?No orders of the defined types were placed in this encounter. ? ?Agree with return to Psychiatry by his request for further management of Generalized Anxiety and known Schizophrenia in remission. ? ?Return to Dr Shea Evans / [facility] Dancer, will place referral, last seen 2020. ? ?He may continue current therapy ? ?Follow up plan: ?Return if  symptoms worsen or fail to improve. ? ? ?Patient verbalizes understanding with the above medical recommendations including the limitation of remote medical advice. ? ?Specific follow-up and call-back criteria were given for patient to follow-up or seek medical care more urgently if needed. ? ?Total duration of direct patient care provided via video conference  10 minutes ? ? ?Nobie Putnam, DO ?[facility] ?[facility] ?02/22/2022, 11:42 AM ?

## 2022-03-17 ENCOUNTER — Ambulatory Visit: Payer: Medicare Other | Admitting: Pulmonary Disease

## 2022-04-04 ENCOUNTER — Other Ambulatory Visit: Payer: Self-pay | Admitting: Family Medicine

## 2022-04-04 DIAGNOSIS — F200 Paranoid schizophrenia: Secondary | ICD-10-CM

## 2022-04-05 NOTE — Telephone Encounter (Signed)
Requested Prescriptions  ?Pending Prescriptions Disp Refills  ?? benztropine (COGENTIN) 1 MG tablet [Pharmacy Med Name: BENZTROPINE MES 1 MG TABLET] 180 tablet 0  ?  Sig: TAKE 1 TABLET (1 MG TOTAL) BY MOUTH 2 (TWO) TIMES DAILY. FOR SIDE EFFECTS OF RISPERIDONE  ?  ? Neurology: Parkinsonian Agents - benztropine mesylate Passed - 04/04/2022  2:33 PM  ?  ?  Passed - Last BP in normal range  ?  BP Readings from Last 1 Encounters:  ?01/25/22 133/74  ?   ?  ?  Passed - Last Heart Rate in normal range  ?  Pulse Readings from Last 1 Encounters:  ?01/25/22 70  ?   ?  ?  Passed - Valid encounter within last 12 months  ?  Recent Outpatient Visits   ?      ? 1 month ago Paranoid schizophrenia (HCC)  ? [facility], Netta Neat, DO  ? 2 months ago Acute viral syndrome  ? [facility] [place], Netta Neat, DO  ? 5 months ago Influenza A  ? [facility] Smitty Cords, DO  ? 9 months ago LUQ abdominal pain  ? [facility], Netta Neat, DO  ? 1 year ago Viral upper respiratory tract infection  ? [facility] Gabriel Cirri, NP  ?  ?  ?Future Appointments   ?        ? In 5 months [facility], [place]   ?  ? ?  ?  ?  ? ? ?

## 2022-04-08 ENCOUNTER — Other Ambulatory Visit: Payer: Self-pay | Admitting: Family Medicine

## 2022-04-08 DIAGNOSIS — F200 Paranoid schizophrenia: Secondary | ICD-10-CM

## 2022-04-10 NOTE — Telephone Encounter (Signed)
Noted , will discuss with patient. Thank you. ?

## 2022-04-10 NOTE — Telephone Encounter (Signed)
Requested medication (s) are due for refill today: yes ? ?Requested medication (s) are on the active medication list: yes ? ?Last refill:  10/10/21 #180/1 ? ?Future visit scheduled: no ? ?Notes to clinic:  Unable to refill per protocol due to failed labs, no updated results. ? ?  ?Requested Prescriptions  ?Pending Prescriptions Disp Refills  ? divalproex (DEPAKOTE ER) 500 MG 24 hr tablet [Pharmacy Med Name: DIVALPROEX SOD ER 500 MG TAB] 180 tablet 1  ?  Sig: TAKE 2 TABLETS (1,000 MG TOTAL) BY MOUTH AT BEDTIME.  ?  ? Neurology:  Anticonvulsants - Valproates Failed - 04/08/2022  9:24 AM  ?  ?  Failed - AST in normal range and within 360 days  ?  AST  ?Date Value Ref Range Status  ?07/21/2020 19 10 - 40 U/L Final  ?  ?  ?  ?  Failed - ALT in normal range and within 360 days  ?  ALT  ?Date Value Ref Range Status  ?07/21/2020 21 9 - 46 U/L Final  ?  ?  ?  ?  Failed - HGB in normal range and within 360 days  ?  Hemoglobin  ?Date Value Ref Range Status  ?07/21/2020 15.2 13.2 - 17.1 g/dL Final  ?  ?  ?  ?  Failed - PLT in normal range and within 360 days  ?  Platelets  ?Date Value Ref Range Status  ?07/21/2020 284 140 - 400 Thousand/uL Final  ?  ?  ?  ?  Failed - WBC in normal range and within 360 days  ?  WBC  ?Date Value Ref Range Status  ?07/21/2020 5.5 3.8 - 10.8 Thousand/uL Final  ?  ?  ?  ?  Failed - HCT in normal range and within 360 days  ?  HCT  ?Date Value Ref Range Status  ?07/21/2020 46.1 38.5 - 50.0 % Final  ?  ?  ?  ?  Failed - Valproic Acid (serum) in normal range and within 360 days  ?  Valproic Acid Lvl  ?Date Value Ref Range Status  ?03/04/2020 84.0 50.0 - 100.0 mg/L Final  ?  ?  ?  ?  Passed - Completed PHQ-2 or PHQ-9 in the last 360 days  ?  ?  Passed - Patient is not pregnant  ?  ?  Passed - Valid encounter within last 12 months  ?  Recent Outpatient Visits   ? ?      ? 1 month ago Paranoid schizophrenia ([place])  ? [place], DO  ? 2 months ago Acute viral  syndrome  ? [place], DO  ? 5 months ago Influenza A  ? [place], DO  ? 9 months ago LUQ abdominal pain  ? [place], DO  ? 1 year ago Viral upper respiratory tract infection  ? [facility] Kathrine Haddock, NP  ? ?  ?  ?Future Appointments   ? ?        ? In 5 months [facility], [place]   ? ?  ? ? ?  ?  ?  ? ?

## 2022-04-12 ENCOUNTER — Ambulatory Visit: Payer: Medicare Other | Admitting: Psychiatry

## 2022-04-16 ENCOUNTER — Other Ambulatory Visit: Payer: Self-pay | Admitting: Pulmonary Disease

## 2022-04-16 ENCOUNTER — Other Ambulatory Visit: Payer: Self-pay | Admitting: Family Medicine

## 2022-04-16 DIAGNOSIS — F200 Paranoid schizophrenia: Secondary | ICD-10-CM

## 2022-04-18 NOTE — Telephone Encounter (Signed)
Refilled 04/05/2022 #180 0 refills. ?Requested Prescriptions  ?Pending Prescriptions Disp Refills  ?? benztropine (COGENTIN) 1 MG tablet [Pharmacy Med Name: BENZTROPINE MES 1 MG TABLET] 180 tablet 0  ?  Sig: TAKE 1 TABLET (1 MG TOTAL) BY MOUTH 2 (TWO) TIMES DAILY. FOR SIDE EFFECTS OF RISPERIDONE  ?  ? Neurology: Parkinsonian Agents - benztropine mesylate Passed - 04/16/2022  1:05 PM  ?  ?  Passed - Last BP in normal range  ?  BP Readings from Last 1 Encounters:  ?01/25/22 133/74  ?   ?  ?  Passed - Last Heart Rate in normal range  ?  Pulse Readings from Last 1 Encounters:  ?01/25/22 70  ?   ?  ?  Passed - Valid encounter within last 12 months  ?  Recent Outpatient Visits   ?      ? 1 month ago Paranoid schizophrenia (HCC)  ? [facility] [place], Netta Neat, DO  ? 2 months ago Acute viral syndrome  ? [facility] [place], Netta Neat, DO  ? 6 months ago Influenza A  ? [facility] Smitty Cords, DO  ? 9 months ago LUQ abdominal pain  ? [facility] [place], Netta Neat, DO  ? 1 year ago Viral upper respiratory tract infection  ? [facility] Gabriel Cirri, NP  ?  ?  ?Future Appointments   ?        ? In 5 months [facility], [place]   ?  ? ?  ?  ?  ? ?

## 2022-05-04 ENCOUNTER — Ambulatory Visit: Admission: RE | Admit: 2022-05-04 | Payer: Medicare Other | Source: Ambulatory Visit | Admitting: Pulmonary Disease

## 2022-05-04 ENCOUNTER — Ambulatory Visit (INDEPENDENT_AMBULATORY_CARE_PROVIDER_SITE_OTHER): Payer: Medicare Other | Admitting: Pulmonary Disease

## 2022-05-04 ENCOUNTER — Encounter: Payer: Self-pay | Admitting: Pulmonary Disease

## 2022-05-04 VITALS — BP 124/80 | HR 69 | Temp 97.8°F | Ht 77.0 in | Wt 217.8 lb

## 2022-05-04 DIAGNOSIS — J441 Chronic obstructive pulmonary disease with (acute) exacerbation: Secondary | ICD-10-CM

## 2022-05-04 DIAGNOSIS — J45901 Unspecified asthma with (acute) exacerbation: Secondary | ICD-10-CM

## 2022-05-04 DIAGNOSIS — J4520 Mild intermittent asthma, uncomplicated: Secondary | ICD-10-CM

## 2022-05-04 DIAGNOSIS — R0602 Shortness of breath: Secondary | ICD-10-CM | POA: Diagnosis not present

## 2022-05-04 DIAGNOSIS — J3089 Other allergic rhinitis: Secondary | ICD-10-CM

## 2022-05-04 MED ORDER — ALBUTEROL SULFATE HFA 108 (90 BASE) MCG/ACT IN AERS: 2.0000 | INHALATION_SPRAY | Freq: Four times a day (QID) | RESPIRATORY_TRACT | 2 refills | Status: DC | PRN

## 2022-05-04 MED ORDER — AZITHROMYCIN 250 MG PO TABS: ORAL_TABLET | ORAL | 0 refills | Status: AC

## 2022-05-04 NOTE — Patient Instructions (Signed)
Sent in a prescription for an antibiotic to see if that helps clear your phlegm. ? ?We are getting a chest x-ray today to make sure your lungs are okay. ? ?Continue using your Trelegy, 1 puff daily. ? ?We have sent also a prescription for your emergency inhaler. ? ?A good nasal saline spray that you can use anytime during the day is AYR, you can get this over-the-counter.  Another one is called Ocean spray.  Either of these will work fine. ? ?Follow-up in 4 months time call sooner should any new problems arise. ?

## 2022-05-04 NOTE — Progress Notes (Signed)
Subjective:    Patient ID: Rodney [name], male    DOB: Apr 28, 1986, 36 y.o.   MRN: [id number] Patient Care Team: Smitty Cords, DO as PCP - General [facility])  Chief Complaint  Patient presents with   Follow-up    SOB with exertion and prod cough with clear sputum.     HPI [name] is a 36 year old current smoker (1-1.5 PPD) who presents for follow-up on the issue of asthma/COPD overlap.  Patient continues to be compliant with Trelegy Ellipta 200.  He has had worsening issues with nasal congestion.  Still compliant with Nasacort.  He is not following much with nasal hygiene however.  He does not endorse any fevers, chills or sweats.  Over the last few days he has noted some mild increased shortness of breath and wheezing and cough productive of yellowish sputum.  No hemoptysis.  No chest pain, no lower extremity edema nor calf tenderness.   He continues to work for [employer].   Review of Systems A 10 point review of systems was performed and it is as noted above otherwise negative.  Patient Active Problem List   Diagnosis Date Noted   Memory loss 05/23/2022   High risk medication use 05/23/2022   Bladder wall thickening 08/06/2020   GAD (generalized anxiety disorder) 09/02/2019   Cannabis use disorder, mild, in sustained remission 09/02/2019   Alcohol use disorder, moderate, dependence (HCC) 02/18/2019   Asthma with COPD (HCC) 09/26/2018   Paranoid schizophrenia (HCC) 02/05/2018   Vitamin D deficiency 08/09/2015   Reactive airway disease with status asthmaticus 08/09/2015   Cigarette nicotine dependence with nicotine-induced disorder 09/14/2014   Alcohol use disorder, mild, abuse 02/08/2011   Tobacco use disorder 02/08/2011   Allergic rhinitis 02/08/2011   SCHIZOPHRENIA, HX OF 02/08/2011   Social History   Tobacco Use   Smoking status: Every Day    Packs/day: 1.50    Years: 20.00    Total pack years: 30.00    Types: Cigarettes    Smokeless tobacco: Never   Tobacco comments:    1-1.5PPD 03/10/2021  Substance Use Topics   Alcohol use: Yes    Alcohol/week: 35.0 standard drinks of alcohol    Types: 35 Cans of beer per week    Comment: 6 drinks a day   Allergies  Allergen Reactions   Hydroxyzine Hcl Rash   Current Meds  Medication Sig   albuterol (PROAIR HFA) 108 (90 Base) MCG/ACT inhaler Inhale 2 puffs into the lungs every 6 (six) hours as needed for wheezing or shortness of breath.   Fluticasone-Umeclidin-Vilant (TRELEGY ELLIPTA) 200-62.5-25 MCG/INH AEPB Inhale 200 mcg into the lungs daily.   triamcinolone (NASACORT) 55 MCG/ACT AERO nasal inhaler PLACE 1 SPRAY INTO THE NOSE 2 (TWO) TIMES DAILY.   benztropine (COGENTIN) 1 MG tablet TAKE 1 TABLET (1 MG TOTAL) BY MOUTH 2 (TWO) TIMES DAILY. FOR SIDE EFFECTS OF RISPERIDONE   divalproex (DEPAKOTE ER) 500 MG 24 hr tablet TAKE 2 TABLETS (1,000 MG TOTAL) BY MOUTH AT BEDTIME.   gabapentin (NEURONTIN) 600 MG tablet TAKE 1 TABLET BY MOUTH THREE TIMES A DAY   risperiDONE (RISPERDAL) 2 MG tablet TAKE 1 TABLET BY MOUTH AT BEDTIME.   Immunization History  Administered Date(s) Administered   Pneumococcal Polysaccharide-23 08/09/2015   Tdap 07/19/2020      Objective:   Physical Exam BP 124/80 (BP Location: Left Arm, Cuff Size: Normal)   Pulse 69   Temp 97.8 F (36.6 C) (Temporal)  Ht 6\' 5"  (1.956 m)   Wt 217 lb 12.8 oz (98.8 kg)   SpO2 98%   BMI 25.83 kg/m  GENERAL: Well-developed, well-nourished, no acute distress.  Fully ambulatory.  Speech is fluent. HEAD: Normocephalic, atraumatic.  EYES: Pupils equal, round, reactive to light.  No scleral icterus.  MOUTH: Poor dentition, oral mucosa moist. NECK: Supple. No thyromegaly. Trachea midline. No JVD.  No adenopathy. PULMONARY: Good air entry bilaterally.  Scattered rhonchi throughout, no wheezes. CARDIOVASCULAR: S1 and S2. Regular rate and rhythm.  No rubs, murmurs or gallops heard. ABDOMEN: Benign. MUSCULOSKELETAL:  No joint deformity, no clubbing, no edema.  NEUROLOGIC: No focal deficit, no gait disturbance, speech is fluent. SKIN: Intact,warm,dry.  On limited exam, no rashes PSYCH: Somewhat flat affect, normal behavior.       Assessment & Plan:     ICD-10-CM   1. Asthma exacerbation with COPD (chronic obstructive pulmonary disease) (HCC)  J44.1 albuterol (PROAIR HFA) 108 (90 Base) MCG/ACT inhaler   J45.901    Continue Trelegy and as needed albuterol Azithromycin treatment pack, for 5 days    2. Shortness of breath  R06.02 CANCELED: DG Chest 2 View   CXR PA and Lat    3. Non-seasonal allergic rhinitis due to other allergic trigger  J30.89    Nasal hygiene Nasal saline spray Continue Nasacort     Meds ordered this encounter  Medications   albuterol (PROAIR HFA) 108 (90 Base) MCG/ACT inhaler    Sig: Inhale 2 puffs into the lungs every 6 (six) hours as needed for wheezing or shortness of breath.    Dispense:  8.5 each    Refill:  2    DX Code Needed  .   azithromycin (ZITHROMAX) 250 MG tablet    Sig: Take 2 tablets (500 mg) on  Day 1,  followed by 1 tablet (250 mg) once daily on Days 2 through 5.    Dispense:  6 each    Refill:  0   Will call the patient with the results of his chest x-ray once these are available.  Will see the patient in follow-up in 4 months time he is to call sooner should any new problems arise.  Gailen Shelter, MD Advanced Bronchoscopy PCCM [facility]    *This note was dictated using voice recognition software/Dragon.  Despite best efforts to proofread, errors can occur which can change the meaning. Any transcriptional errors that result from this process are unintentional and may not be fully corrected at the time of dictation.

## 2022-05-13 DIAGNOSIS — B353 Tinea pedis: Secondary | ICD-10-CM | POA: Diagnosis not present

## 2022-05-23 ENCOUNTER — Emergency Department
Admission: EM | Admit: 2022-05-23 | Discharge: 2022-05-23 | Disposition: A | Discharge status: Home / Self Care | Payer: Medicare Other | Attending: Emergency Medicine | Admitting: Emergency Medicine

## 2022-05-23 ENCOUNTER — Emergency Department: Payer: Medicare Other

## 2022-05-23 ENCOUNTER — Other Ambulatory Visit: Payer: Self-pay

## 2022-05-23 ENCOUNTER — Telehealth: Payer: Self-pay

## 2022-05-23 ENCOUNTER — Encounter: Payer: Self-pay | Admitting: Psychiatry

## 2022-05-23 ENCOUNTER — Ambulatory Visit (INDEPENDENT_AMBULATORY_CARE_PROVIDER_SITE_OTHER): Payer: Medicare Other | Admitting: Psychiatry

## 2022-05-23 VITALS — BP 131/78 | HR 85 | Temp 97.7°F | Wt 219.8 lb

## 2022-05-23 DIAGNOSIS — R059 Cough, unspecified: Secondary | ICD-10-CM | POA: Diagnosis not present

## 2022-05-23 DIAGNOSIS — F200 Paranoid schizophrenia: Secondary | ICD-10-CM | POA: Diagnosis not present

## 2022-05-23 DIAGNOSIS — F17200 Nicotine dependence, unspecified, uncomplicated: Secondary | ICD-10-CM | POA: Diagnosis not present

## 2022-05-23 DIAGNOSIS — Z79899 Other long term (current) drug therapy: Secondary | ICD-10-CM

## 2022-05-23 DIAGNOSIS — R0789 Other chest pain: Secondary | ICD-10-CM | POA: Insufficient documentation

## 2022-05-23 DIAGNOSIS — R079 Chest pain, unspecified: Secondary | ICD-10-CM | POA: Diagnosis not present

## 2022-05-23 DIAGNOSIS — F1010 Alcohol abuse, uncomplicated: Secondary | ICD-10-CM

## 2022-05-23 DIAGNOSIS — F411 Generalized anxiety disorder: Secondary | ICD-10-CM

## 2022-05-23 DIAGNOSIS — R413 Other amnesia: Secondary | ICD-10-CM

## 2022-05-23 DIAGNOSIS — R0602 Shortness of breath: Secondary | ICD-10-CM | POA: Diagnosis not present

## 2022-05-23 LAB — BASIC METABOLIC PANEL
Anion gap: 10 (ref 5–15)
BUN: 13 mg/dL (ref 6–20)
CO2: 26 mmol/L (ref 22–32)
Calcium: 9.8 mg/dL (ref 8.9–10.3)
Chloride: 101 mmol/L (ref 98–111)
Creatinine, Ser: 0.66 mg/dL (ref 0.61–1.24)
GFR, Estimated: >60 mL/min (ref >60)
Glucose, Bld: 96 mg/dL (ref 70–99)
Potassium: 4.2 mmol/L (ref 3.5–5.1)
Sodium: 137 mmol/L (ref 135–145)

## 2022-05-23 LAB — CBC
HCT: 48.1 % (ref 39.0–52.0)
Hemoglobin: 16.1 g/dL (ref 13.0–17.0)
MCH: 32.5 pg (ref 26.0–34.0)
MCHC: 33.5 g/dL (ref 30.0–36.0)
MCV: 97 fL (ref 80.0–100.0)
Platelets: 294 10*3/uL (ref 150–400)
RBC: 4.96 MIL/uL (ref 4.22–5.81)
RDW: 12.8 % (ref 11.5–15.5)
WBC: 7.7 10*3/uL (ref 4.0–10.5)
nRBC: 0 % (ref 0.0–0.2)

## 2022-05-23 LAB — TROPONIN I (HIGH SENSITIVITY): Troponin I (High Sensitivity): <2 ng/L (ref <18)

## 2022-05-23 NOTE — Telephone Encounter (Signed)
spoke with mother staed child needed appt child 12-16 was a no show and the 12-20 and 12-28-21 was canceled . pt was made an appt for tomorrow for medication refills.

## 2022-05-23 NOTE — ED Provider Notes (Signed)
[facility] Provider Note    Event Date/Time   First MD Initiated Contact with Patient 05/23/22 1120     (approximate)  History   Chief Complaint: Chest Pain and Shortness of Breath  HPI  Rodney [name] is a 36 y.o. male with a past medical history of anxiety, bipolar, presents to the emergency department for right-sided chest discomfort.  According to the patient for the past several days he has had a slight cough and has been experiencing some right-sided chest discomfort worse with coughing.  Patient denies any shortness of breath nausea or diaphoresis.  Physical Exam   Triage Vital Signs: ED Triage Vitals  Enc Vitals Group     BP 05/23/22 1056 121/87     Pulse Rate 05/23/22 1056 73     Resp 05/23/22 1056 16     Temp 05/23/22 1056 98.2 F (36.8 C)     Temp Source 05/23/22 1056 Oral     SpO2 05/23/22 1056 98 %     Weight 05/23/22 1054 217 lb (98.4 kg)     Height 05/23/22 1054 6\' 5"  (1.956 m)     Head Circumference --      Peak Flow --      Pain Score 05/23/22 1054 8     Pain Loc --      Pain Edu? --      Excl. in GC? --     Most recent vital signs: Vitals:   05/23/22 1056  BP: 121/87  Pulse: 73  Resp: 16  Temp: 98.2 F (36.8 C)  SpO2: 98%    General: Awake, no distress.  CV:  Good peripheral perfusion.  Regular rate and rhythm  Resp:  Normal effort.  Equal breath sounds bilaterally.  Mild to moderate right-sided chest wall tenderness to palpation. Abd:  No distention.  Soft, nontender.  No rebound or guarding.    ED Results / Procedures / Treatments   EKG  EKG viewed and interpreted by myself shows a normal sinus rhythm at 73 bpm with a narrow QRS, normal axis, normal intervals, no concerning ST changes.  RADIOLOGY  I have interpreted the x-ray images I do not see any obvious pneumonia on my evaluation. Radiology is read the x-ray is negative   MEDICATIONS ORDERED IN ED: Medications - No data to display   IMPRESSION / MDM  / ASSESSMENT AND PLAN / ED COURSE  I reviewed the triage vital signs and the nursing notes.  Patient's presentation is most consistent with acute complicated illness / injury requiring diagnostic workup.  Patient presents to the emergency department for right-sided chest discomfort.  States the pain is worse when he coughs.  Patient has moderate tenderness to palpation on exam.  Patient has reassuring EKG, reassuring chest x-ray.  We will check labs including cardiac enzymes and continue to closely monitor.  Patient agreeable to plan of care.  Differential would include ACS, chest wall pain.  Patient's work-up is reassuring.  Chemistry is normal.  CBC is normal.  Troponin is negative chest x-ray and EKG reassuring as well.  Given the patient's recent cough as well as tenderness to palpation highly suspect musculoskeletal discomfort.  Discussed with the patient supportive care including Tylenol or NSAIDs PCP follow-up.  Discussed my typical chest pain return precautions.  FINAL CLINICAL IMPRESSION(S) / ED DIAGNOSES   Chest wall pain   Note:  This document was prepared using Dragon voice recognition software and may include unintentional dictation errors.  Minna Antis, MD 05/23/22 1159

## 2022-05-23 NOTE — Telephone Encounter (Signed)
This patient was seen today . Adult .

## 2022-05-23 NOTE — ED Triage Notes (Signed)
Pt here with CP and SOB that started on yesterday morning. Pt states pain is right sided and radiates to his arm. Pt denies N/V/D.

## 2022-05-23 NOTE — Progress Notes (Unsigned)
BH MD OP Progress Note  05/23/2022 4:58 PM Rodney [name]  MRN:  [id number]  Chief Complaint:  Chief Complaint  Patient presents with   Follow up: 36 year old Caucasian male, with history of schizophrenia, generalized anxiety disorder, alcoholism, presented for a follow-up appointment.   HPI: Rodney [name] is a 36 year old Caucasian male, employed, lives in [place], married, employed, has a history of schizophrenia, generalized anxiety disorder, alcohol use disorder, cannabis use disorder in remission, tobacco use disorder was evaluated in office today.  Patient today reports he is currently struggling with worsening anxiety, as well as memory problems.  He hence decided to return to this clinic.  His last appointment with writer was on 09/02/2019.  This was less than 3 years ago.  Patient reports his primary care provider was prescribing him his medications and he continued to take the Depakote 1000 mg p.o. daily, gabapentin 600 mg 3 times a day, risperidone 2 mg daily and benztropine 1 mg twice a day.  Patient reports he is currently at the new job since the past 3 months.  There is a lot of stressors at work.  That does make him more anxious.  Patient also reports he has a baby who is almost 50 years old.  He has 2 other children aged 26 and 86 years old.  Taking care of the children also is a psychosocial stressor.  Patient reports he has noticed not only anxiety is getting worse but also memory problems.  He reports he has difficulty keeping up with the learning curve at his work.  He also has difficulty remembering things that he did yesterday for example he forgets what he ate for dinner.  He also has been forgetful with his past history, he feels everything is a 'blur' and cannot remember them anymore.  Patient does report he continues to use alcohol, currently drinks 6-8 drinks-12 ounce beers per day.  He denies any withdrawal symptoms or blackouts.  Patient reports he is willing to quit without any  problems.  Patient denies any suicidality, homicidality or perceptual disturbances.  Patient denies any other concerns today.  Visit Diagnosis:    ICD-10-CM   1. Paranoid schizophrenia (HCC)  F20.0     2. GAD (generalized anxiety disorder)  F41.1     3. Alcohol use disorder, mild, abuse  F10.10     4. Tobacco use disorder  F17.200     5. Memory loss  R41.3 TSH    Vitamin B12    6. High risk medication use  Z79.899 Valproic acid level    Hepatic function panel    Sodium    Platelet count      Past Psychiatric History: Reviewed past psychiatric history from progress note on 09/23/2018.  Past Medical History:  Past Medical History:  Diagnosis Date   Anxiety    Bipolar disorder (HCC)    CIGARETTE SMOKER 02/08/2011   Qualifier: Diagnosis of  By: Laural Benes MD, [place]     Seizures [facility])     Past Surgical History:  Procedure Laterality Date   WISDOM TOOTH EXTRACTION      Family Psychiatric History: Reviewed family psychiatric history from progress note on 09/23/2018.  Family History:  Family History  Problem Relation Age of Onset   Anxiety disorder Mother    OCD Brother    Anxiety disorder Maternal Aunt    Anxiety disorder Maternal Grandmother    Anxiety disorder Paternal Grandfather     Social History: Reviewed social history from progress  note from 09/23/2018. Social History   Socioeconomic History   Marital status: Significant Other    Spouse name: Not on file   Number of children: 1   Years of education: Not on file   Highest education level: Associate degree: occupational, Scientist, product/process development, or vocational program  Occupational History   Occupation: Location manager     Comment: [employer]   Tobacco Use   Smoking status: Every Day    Packs/day: 1.50    Years: 20.00    Pack years: 30.00    Types: Cigarettes   Smokeless tobacco: Never   Tobacco comments:    1-1.5PPD 03/10/2021  Vaping Use   Vaping Use: Former  Substance and Sexual Activity   Alcohol use: Yes     Alcohol/week: 35.0 standard drinks    Types: 35 Cans of beer per week    Comment: 6 drinks a day   Drug use: Not Currently    Types: Marijuana, Other-see comments, LSD    Comment: in past   Sexual activity: Yes  Other Topics Concern   Not on file  Social History Narrative   Not on file   Social Determinants of Health   Financial Resource Strain: Low Risk    Difficulty of Paying Living Expenses: Not hard at all  Food Insecurity: No Food Insecurity   Worried About Programme researcher, broadcasting/film/video in the Last Year: Never true   Ran Out of Food in the Last Year: Never true  Transportation Needs: No Transportation Needs   Lack of Transportation (Medical): No   Lack of Transportation (Non-Medical): No  Physical Activity: Inactive   Days of Exercise per Week: 0 days   Minutes of Exercise per Session: 0 min  Stress: No Stress Concern Present   Feeling of Stress : Not at all  Social Connections: Not on file    Allergies:  Allergies  Allergen Reactions   Hydroxyzine Hcl Rash    Metabolic Disorder Labs: Lab Results  Component Value Date   HGBA1C 5.3 03/04/2020   MPG 105 03/04/2020   MPG 100 02/18/2019   No results found for: PROLACTIN Lab Results  Component Value Date   CHOL 173 03/04/2020   TRIG 49 03/04/2020   HDL 75 03/04/2020   CHOLHDL 2.3 03/04/2020   LDLCALC 84 03/04/2020   LDLCALC 83 02/18/2019   Lab Results  Component Value Date   TSH 1.40 02/18/2019    Therapeutic Level Labs: No results found for: LITHIUM Lab Results  Component Value Date   VALPROATE 84.0 03/04/2020   VALPROATE 77 11/01/2018   No components found for:  CBMZ  Current Medications: Current Outpatient Medications  Medication Sig Dispense Refill   albuterol (PROAIR HFA) 108 (90 Base) MCG/ACT inhaler Inhale 2 puffs into the lungs every 6 (six) hours as needed for wheezing or shortness of breath. 8.5 each 2   benztropine (COGENTIN) 1 MG tablet TAKE 1 TABLET (1 MG TOTAL) BY MOUTH 2 (TWO) TIMES  DAILY. FOR SIDE EFFECTS OF RISPERIDONE 180 tablet 0   divalproex (DEPAKOTE ER) 500 MG 24 hr tablet TAKE 2 TABLETS (1,000 MG TOTAL) BY MOUTH AT BEDTIME. 180 tablet 0   Fluticasone-Umeclidin-Vilant (TRELEGY ELLIPTA) 200-62.5-25 MCG/INH AEPB Inhale 200 mcg into the lungs daily. 28 each 11   gabapentin (NEURONTIN) 600 MG tablet TAKE 1 TABLET BY MOUTH THREE TIMES A DAY 270 tablet 1   risperiDONE (RISPERDAL) 2 MG tablet TAKE 1 TABLET BY MOUTH AT BEDTIME. 90 tablet 1   terbinafine (LAMISIL) 1 %  cream Apply topically.     triamcinolone (NASACORT) 55 MCG/ACT AERO nasal inhaler PLACE 1 SPRAY INTO THE NOSE 2 (TWO) TIMES DAILY. 16.9 each 12   No current facility-administered medications for this visit.     Musculoskeletal: Strength & Muscle Tone: within normal limits Gait & Station: normal Patient leans: N/A  Psychiatric Specialty Exam: Review of Systems  Psychiatric/Behavioral:  The patient is nervous/anxious.   All other systems reviewed and are negative.  Blood pressure 131/78, pulse 85, temperature 97.7 F (36.5 C), temperature source Temporal, weight 219 lb 12.8 oz (99.7 kg).Body mass index is 26.06 kg/m.  General Appearance: Casual  Eye Contact:  Fair  Speech:  Clear and Coherent  Volume:  Normal  Mood:  Anxious  Affect:  Congruent  Thought Process:  Goal Directed and Descriptions of Associations: Intact  Orientation:  Full (Time, Place, and Person)  Thought Content: Logical   Suicidal Thoughts:  No  Homicidal Thoughts:  No  Memory:  Immediate;   Fair Recent;   Fair Remote;   Poor  Judgement:  Fair  Insight:  Fair  Psychomotor Activity:  Normal  Concentration:  Concentration: Fair and Attention Span: Fair  Recall:  [employer] of Knowledge: Fair  Language: Fair  Akathisia:  No  Handed:  Right  AIMS (if indicated): done  Assets:  Communication Skills Desire for Improvement Housing Intimacy Social Support  ADL's:  Intact  Cognition: Impaired,  Mild  Sleep:  Fair    Screenings: AIMS    Flowsheet Row Office Visit from 05/23/2022 in [facility] Office Visit from 10/23/2018 in [facility]  AIMS Total Score 0 3      GAD-7    Flowsheet Row Video Visit from 02/22/2022 in [facility] Office Visit from 01/25/2022 in [facility] Office Visit from 08/29/2019 in [facility]  Total GAD-7 Score 6 7 2       PHQ2-9    Flowsheet Row Video Visit from 02/22/2022 in [facility] Office Visit from 01/25/2022 in [facility] Clinical Support from 09/20/2021 in [facility] Office Visit from 03/05/2020 in [facility] Office Visit from 01/27/2020 in [facility]  PHQ-2 Total Score 0 0 0 0 0  PHQ-9 Total Score 1 1 -- -- --      Flowsheet Row ED from 05/23/2022 in [facility]  C-SSRS RISK CATEGORY No Risk        Assessment and Plan: Rodney [name] is a 36 year old Caucasian male, lives in [place], has a history of schizophrenia, alcoholism, cannabis abuse in remission, anxiety disorder, tobacco use disorder was evaluated in office today.  Patient is currently struggling with anxiety as well as memory problems.  Will benefit from the following plan.  Plan Schizophrenia-stable Risperidone 2 mg p.o. nightly Cogentin 1 mg p.o. twice daily as needed Depakote ER 1000 mg p.o. daily Depakote level-03/04/2020-reviewed-84-therapeutic.  GAD-unstable Gabapentin 600 mg p.o. 3 times daily Will consider adding an SSRI however will await labs. Patient advised to establish care with a therapist-provided resources.   Memory loss-unstable We will get a TSH, vitamin B12 level Slums-25 out of 30-mild cognitive impairment. Will await labs-will consider referral to neurology.  Alcohol use disorder mild-unstable Provided counseling.  Patient advised to establish care with  therapist.  High risk medication use-will order the following labs-valproic acid level, sodium, platelet count, hepatic function panel.  Patient to  go to [employer] tomorrow morning.  Tobacco use disorder-unstable Provided counseling for 1 minute.  Reviewed notes per Dr. Karamalegos-02/22/2022-patient with paranoid schizophrenia-was continued on current medications.  Reviewed notes per emergency department visit today-05/23/2022-patient was evaluated for chest pain and cleared.  Follow-up in clinic in 2 to 3 weeks or sooner if needed.   This note was generated in part or whole with voice recognition software. Voice recognition is usually quite accurate but there are transcription errors that can and very often do occur. I apologize for any typographical errors that were not detected and corrected.     Rodney LongsSaramma Ishia Tenorio, MD 05/23/2022, 4:58 PM

## 2022-05-23 NOTE — Patient Instructions (Signed)
[email or web address] ? ?www.[email or web address] ? ?[email or web address] ?

## 2022-05-23 NOTE — Telephone Encounter (Signed)
Dr. Eappen's pt

## 2022-05-24 ENCOUNTER — Ambulatory Visit: Payer: Self-pay | Admitting: *Deleted

## 2022-05-24 ENCOUNTER — Other Ambulatory Visit
Admission: RE | Admit: 2022-05-24 | Discharge: 2022-05-24 | Disposition: A | Discharge status: Home / Self Care | Payer: Medicare Other | Attending: Psychiatry | Admitting: Psychiatry

## 2022-05-24 DIAGNOSIS — F411 Generalized anxiety disorder: Secondary | ICD-10-CM | POA: Diagnosis not present

## 2022-05-24 DIAGNOSIS — Z79899 Other long term (current) drug therapy: Secondary | ICD-10-CM | POA: Diagnosis not present

## 2022-05-24 DIAGNOSIS — F17200 Nicotine dependence, unspecified, uncomplicated: Secondary | ICD-10-CM | POA: Insufficient documentation

## 2022-05-24 DIAGNOSIS — F1010 Alcohol abuse, uncomplicated: Secondary | ICD-10-CM | POA: Insufficient documentation

## 2022-05-24 DIAGNOSIS — R413 Other amnesia: Secondary | ICD-10-CM | POA: Insufficient documentation

## 2022-05-24 DIAGNOSIS — F200 Paranoid schizophrenia: Secondary | ICD-10-CM | POA: Insufficient documentation

## 2022-05-24 LAB — HEPATIC FUNCTION PANEL
ALT: 25 U/L (ref 0–44)
AST: 23 U/L (ref 15–41)
Albumin: 4.5 g/dL (ref 3.5–5.0)
Alkaline Phosphatase: 49 U/L (ref 38–126)
Bilirubin, Direct: <0.1 mg/dL (ref 0.0–0.2)
Total Bilirubin: 0.6 mg/dL (ref 0.3–1.2)
Total Protein: 7.9 g/dL (ref 6.5–8.1)

## 2022-05-24 LAB — TSH: TSH: 3.543 u[IU]/mL (ref 0.350–4.500)

## 2022-05-24 LAB — VALPROIC ACID LEVEL: Valproic Acid Lvl: 61 ug/mL (ref 50.0–100.0)

## 2022-05-24 LAB — PLATELET COUNT: Platelets: 299 10*3/uL (ref 150–400)

## 2022-05-24 LAB — SODIUM: Sodium: 137 mmol/L (ref 135–145)

## 2022-05-24 LAB — VITAMIN B12: Vitamin B-12: 228 pg/mL (ref 180–914)

## 2022-05-24 NOTE — Telephone Encounter (Signed)
  Chief Complaint: right chest and arm pain with shortness of breath, coughing.   Moving right arm causes pain.   Coughing and deep breaths causes right chest pain.   Seen in ED 05/23/2022 everything was fine per pt Symptoms: above Frequency: constant but worse with coughing, movement of right arm and bending over Pertinent Negatives: Patient denies dizziness, breaking out in sweats, nausea, cardiac history. Disposition: [] ED /[] Urgent Care (no appt availability in office) / [x] Appointment(In office/virtual)/ []  [facility]/ [] Home Care/ [] Refused Recommended Disposition /[] [facility]/ []  Follow-up with PCP Additional Notes: Appt made with Dr for 05/26/2022 at 10:40 with instructions to go to  ED if he becomes worse.

## 2022-05-24 NOTE — Telephone Encounter (Signed)
Reason for Disposition  [1] Chest pain(s) lasting a few seconds AND [2] persists > 3 days  Answer Assessment - Initial Assessment Questions 1. LOCATION: "Where does it hurt?"       Having chest pain, right arm pain and shortness of breath.  Coughing causes pain in right chest and a deep breath hurts in right side and back of shoulder.   I wrestling with my daughter the other day and woke up the next morning feeling the pain in right chest.   Went to ED yesterday and they said everything was ok.  They checked my heart.    2. RADIATION: "Does the pain go anywhere else?" (e.g., into neck, jaw, arms, back)     If I bend over it makes the pain worse in my chest.   Moving my right arm hurts.   My fingers last 2 feel "funny".    When I make a fist I feel it in my elbow.   I'm at work now and lifting is causing me pain in right arm and shoulder. 3. ONSET: "When did the chest pain begin?" (Minutes, hours or days)      Labor Day Monday 4. PATTERN "Does the pain come and go, or has it been constant since it started?"  "Does it get worse with exertion?"      Comes and goes due to activity    Coughing hurts 5. DURATION: "How long does it last" (e.g., seconds, minutes, hours)     Not asked 6. SEVERITY: "How bad is the pain?"  (e.g., Scale 1-10; mild, moderate, or severe)    - MILD (1-3): doesn't interfere with normal activities     - MODERATE (4-7): interferes with normal activities or awakens from sleep    - SEVERE (8-10): excruciating pain, unable to do any normal activities       Moderate 7. CARDIAC RISK FACTORS: "Do you have any history of heart problems or risk factors for heart disease?" (e.g., angina, prior heart attack; diabetes, high blood pressure, high cholesterol, smoker, or strong family history of heart disease)     No 8. PULMONARY RISK FACTORS: "Do you have any history of lung disease?"  (e.g., blood clots in lung, asthma, emphysema, birth control pills)     X ray done and it was fine. 9.  CAUSE: "What do you think is causing the chest pain?"     I don't know 10. OTHER SYMPTOMS: "Do you have any other symptoms?" (e.g., dizziness, nausea, vomiting, sweating, fever, difficulty breathing, cough)       No.    I have a cough lately that makes a pain in my chest.   No URI symptoms. 11. PREGNANCY: "Is there any chance you are pregnant?" "When was your last menstrual period?"       N/A  Protocols used: Chest Pain-A-AH

## 2022-05-26 ENCOUNTER — Encounter: Payer: Self-pay | Admitting: Family Medicine

## 2022-05-26 ENCOUNTER — Ambulatory Visit (INDEPENDENT_AMBULATORY_CARE_PROVIDER_SITE_OTHER): Payer: Medicare Other | Admitting: Family Medicine

## 2022-05-26 VITALS — BP 110/62 | HR 75 | Ht 77.0 in | Wt 219.2 lb

## 2022-05-26 DIAGNOSIS — R091 Pleurisy: Secondary | ICD-10-CM

## 2022-05-26 DIAGNOSIS — M25511 Pain in right shoulder: Secondary | ICD-10-CM

## 2022-05-26 MED ORDER — NAPROXEN 500 MG PO TABS: 500.0000 mg | ORAL_TABLET | Freq: Two times a day (BID) | ORAL | 1 refills | Status: DC

## 2022-05-26 MED ORDER — BACLOFEN 10 MG PO TABS: 5.0000 mg | ORAL_TABLET | Freq: Three times a day (TID) | ORAL | 1 refills | Status: DC | PRN

## 2022-05-26 NOTE — Patient Instructions (Addendum)
Thank you for coming to the office today.  Pleurisy  Pleurisy is irritation and inflammation of the linings of the lungs (pleura). Pleura cover the outside of the lungs and the inside of the chest wall. Normally, there is a small amount of fluid (pleural fluid) between the pleura that allows the lungs to move in and out smoothly when you breathe. Pleurisy can cause the pleura to be rough and dry and rub together when breathing, making it difficult to breathe or cough. In some cases, pleurisy can be associated with a buildup of fluid between the pleura (pleural effusion). Pleurisy is also called pleuritis. What are the causes? Common causes of this condition include: A lung infection caused by bacteria or a virus. A blood clot that travels to the lungs (pulmonary embolism). Air leaking into the pleural space (pneumothorax). This can happen due to injury or trauma to the chest. Lung cancer or a lung tumor. Heart or chest surgery. Lung damage from inhaling asbestos. A lung reaction to certain medicines, or treatments for cancer, such as chemotherapy or radiation therapy to the chest. Diseases that can cause lung inflammation. These include rheumatoid arthritis, lupus, sickle cell disease, inflammatory bowel disease, and pancreatitis. Sometimes, the cause of this condition is not known. What are the signs or symptoms? The main symptom of this condition is chest pain. The pain is usually on one side. Chest pain may start suddenly and be sharp or stabbing. It may become a constant dull ache. You may also feel pain in your back or shoulder. The pain may get worse when you cough, take deep breaths, or make sudden movements. Other symptoms may include: Shortness of breath. Noisy breathing (wheezing or rattling). Cough. Chills. Fever. Coughing up blood (hemoptysis) or yellowish mucus from your lungs (sputum). Symptoms can be worse with certain positions, such as when lying down or lying to one side.  Signs and symptoms of pleurisy may be very similar to the signs and symptoms of a heart attack or inflammation of the heart (pericarditis). How is this diagnosed? This condition may be diagnosed based on: Your medical history, especially if you have heart or lung disease. Your symptoms. A physical exam. Your health care provider will listen to your breathing with a stethoscope to check for a rough, rubbing sound (friction rub) when you breathe. Your breath sounds may be muffled and decreased on the affected side. Tests. You may have: Blood tests to check for infections or diseases and to measure the oxygen in your blood. An EKG to look at your heart rhythm. Imaging tests of your lungs. These may include a chest X-ray, ultrasound, an MRI, or a CT scan. A procedure using a needle to remove pleural fluid for testing (thoracentesis). How is this treated? Treatment for this condition depends on the cause. Pleurisy that was caused by a virus usually clears up within 2 weeks. Treatment for pleurisy may include: NSAIDs, such as ibuprofen, to help relieve pain and inflammation. Antibiotic medicines, if your condition was caused by a bacterial infection. Prescription pain medicine or cough medicine. Blood-thinning (anticoagulant) medicines to treat blood clots, if your condition was caused by pulmonary embolism. Removal of pleural fluid (thoracentesis) or air, using a chest tube to vacuum fluid or air from the pleural space. Follow these instructions at home: Medicines Take over-the-counter and prescription medicines only as told by your health care provider. If you were prescribed an antibiotic, take it as told by your health care provider. Do not stop taking the  antibiotic even if you start to feel better. If you were prescribed medicines to remove extra fluid from your lungs (diuretics), take them as told by your health care provider. If you were prescribed an anticoagulant, take it exactly as told  by your health care provider. This is important. Activity Rest and return to your normal activities as told by your health care provider. Ask your health care provider what activities are safe for you. Ask your health care provider if the medicine prescribed to you requires you to avoid driving or using machinery. General instructions  Watch for any changes in your condition. Take deep breaths often, even if it is painful. This can help prevent lung infection (pneumonia) and collapse of lung tissue (atelectasis). You may be given a medical device (incentive spirometer) to help exercise your lungs and breathing, to prevent lung complications. Do not use any products that contain nicotine or tobacco, such as cigarettes, e-cigarettes, and chewing tobacco. If you need help quitting, ask your health care provider. Keep all follow-up visits as told by your health care provider. This is important. Contact a health care provider if: You have pain that: Gets worse or more frequent. Does not get better with medicines you were prescribed. You have a fever or chills. Your cough or shortness of breath is not improving or getting worse. You cough up pus-like (purulent) fluid. Get help right away if: You cough up blood. You have any of the following symptoms that get worse: Difficulty breathing with activity, especially minimal activity. Shortness of breath. Wheezing. You have pain that spreads into your neck, arms, or jaw. You feel dizzy or faint. These symptoms may represent a serious problem that is an emergency. Do not wait to see if the symptoms will go away. Get medical help right away. Call your local emergency services (911 in the [place] Do not drive yourself to the hospital. Summary Pleurisy is inflammation of the linings of the lungs. Pleurisy causes pain that makes it difficult for you to breathe or cough. Do not use any products that contain nicotine or tobacco, such as cigarettes,  e-cigarettes, and chewing tobacco. If you need help quitting, ask your health care provider. Rest and return to your normal activities as told by your health care provider. Ask your health care provider what activities are safe for you. Keep all follow-up visits as told by your health care provider. This is important. This information is not intended to replace advice given to you by your health care provider. Make sure you discuss any questions you have with your health care provider. Document Revised: 01/15/2020 Document Reviewed: 01/15/2020 Elsevier Patient Education  2023 [employer]     Please schedule a Follow-up Appointment to: Return if symptoms worsen or fail to improve.  If you have any other questions or concerns, please feel free to call the office or send a message through MyChart. You may also schedule an earlier appointment if necessary.  Additionally, you may be receiving a survey about your experience at our office within a few days to 1 week by e-mail or mail. We value your feedback.  Saralyn Pilar, DO [facility], [place]

## 2022-05-26 NOTE — Progress Notes (Signed)
Subjective:    Patient ID: Rodney [name], male    DOB: Oct 09, 1986, 36 y.o.   MRN: [id number]  Rodney [name] is a 36 y.o. male presenting on 05/26/2022 for Cough and Shoulder Pain   HPI  R Sided Chest Wall and Shoulder Pain Pleurisy  Reviewed recent ED visit 05/23/22, work up negative labs and EKG CXR  Onset symptoms about 5 days now with woke up with pain Has a chronic smokers cough regularly, and now pain worse with coughing Right sided anterior chest wall pain worse with deep cough then migrated to R posterior shoulder, worse with deep breath pleuritic pain and also if bending forward. Admits some sinus congestion, took mucinex today  He follows Pulmonology and has asthma. Not having wheezing or dyspnea.      05/26/2022   10:46 AM 05/23/2022    4:59 PM 02/22/2022   11:38 AM  Depression screen PHQ 2/9  Decreased Interest 0  0  Down, Depressed, Hopeless 0  0  PHQ - 2 Score 0  0  Altered sleeping 0  0  Tired, decreased energy 1  1  Change in appetite 0  0  Feeling bad or failure about yourself  0  0  Trouble concentrating 0  0  Moving slowly or fidgety/restless 0  0  Suicidal thoughts 0  0  PHQ-9 Score 1  1  Difficult doing work/chores Not difficult at all  Not difficult at all     Information is confidential and restricted. Go to Review Flowsheets to unlock data.    Social History   Tobacco Use   Smoking status: Every Day    Packs/day: 1.50    Years: 20.00    Pack years: 30.00    Types: Cigarettes   Smokeless tobacco: Never   Tobacco comments:    1-1.5PPD 03/10/2021  Vaping Use   Vaping Use: Former  Substance Use Topics   Alcohol use: Yes    Alcohol/week: 35.0 standard drinks    Types: 35 Cans of beer per week    Comment: 6 drinks a day   Drug use: Not Currently    Types: Marijuana, Other-see comments, LSD    Comment: in past    Review of Systems Per HPI unless specifically indicated above     Objective:    BP 110/62   Pulse 75   Ht 6\' 5"  (1.956 m)    Wt 219 lb 3.2 oz (99.4 kg)   SpO2 98%   BMI 25.99 kg/m   Wt Readings from Last 3 Encounters:  05/26/22 219 lb 3.2 oz (99.4 kg)  05/23/22 217 lb (98.4 kg)  05/04/22 217 lb 12.8 oz (98.8 kg)    Physical Exam Vitals and nursing note reviewed.  Constitutional:      General: He is not in acute distress.    Appearance: He is well-developed. He is not diaphoretic.     Comments: Well-appearing, comfortable, cooperative  HENT:     Head: Normocephalic and atraumatic.  Eyes:     General:        Right eye: No discharge.        Left eye: No discharge.     Conjunctiva/sclera: Conjunctivae normal.  Neck:     Thyroid: No thyromegaly.  Cardiovascular:     Rate and Rhythm: Normal rate and regular rhythm.     Pulses: Normal pulses.     Heart sounds: Normal heart sounds. No murmur heard. Pulmonary:     Effort: Pulmonary effort  is normal. No respiratory distress.     Breath sounds: Normal breath sounds. No wheezing or rales.     Comments: Some pain with deep breathing pleuritic Chest:     Chest wall: Tenderness (R pectorialis and R posterior shoulder) present.  Musculoskeletal:        General: Normal range of motion.     Cervical back: Normal range of motion and neck supple.     Comments: Full range of motion R shoulder  Lymphadenopathy:     Cervical: No cervical adenopathy.  Skin:    General: Skin is warm and dry.     Findings: No erythema or rash.  Neurological:     Mental Status: He is alert and oriented to person, place, and time. Mental status is at baseline.  Psychiatric:        Behavior: Behavior normal.     Comments: Well groomed, good eye contact, normal speech and thoughts    I have personally reviewed the radiology report from 05/23/22 CXR.  DG Chest 2 ViewPerformed 05/23/2022 Final result  Study Result CLINICAL DATA: Chest pain and short of breath  EXAM: CHEST - 2 VIEW  COMPARISON: 10/12/2020  FINDINGS: The heart size and mediastinal contours are within normal  limits. Both lungs are clear. The visualized skeletal structures are unremarkable.  IMPRESSION: No active cardiopulmonary disease.   Electronically Signed By: Franchot Gallo M.D. On: 05/23/2022 11:21    Results for orders placed or performed during the hospital encounter of 05/24/22  Hepatic function panel  Result Value Ref Range   Total Protein 7.9 6.5 - 8.1 g/dL   Albumin 4.5 3.5 - 5.0 g/dL   AST 23 15 - 41 U/L   ALT 25 0 - 44 U/L   Alkaline Phosphatase 49 38 - 126 U/L   Total Bilirubin 0.6 0.3 - 1.2 mg/dL   Bilirubin, Direct <0.1 0.0 - 0.2 mg/dL   Indirect Bilirubin NOT CALCULATED 0.3 - 0.9 mg/dL  TSH  Result Value Ref Range   TSH 3.543 0.350 - 4.500 uIU/mL  Vitamin B12  Result Value Ref Range   Vitamin B-12 228 180 - 914 pg/mL  Valproic acid level  Result Value Ref Range   Valproic Acid Lvl 61 50.0 - 100.0 ug/mL  Sodium  Result Value Ref Range   Sodium 137 135 - 145 mmol/L  Platelet count  Result Value Ref Range   Platelets 299 150 - 400 K/uL      Assessment & Plan:   Problem List Items Addressed This Visit   None Visit Diagnoses     Pleurisy    -  Primary   Relevant Medications   naproxen (NAPROSYN) 500 MG tablet   baclofen (LIORESAL) 10 MG tablet   Acute pain of right shoulder       Relevant Medications   naproxen (NAPROSYN) 500 MG tablet   baclofen (LIORESAL) 10 MG tablet       Localized symptoms R sided anterior chest / pectoralis and R posterior shoulder girdle, classic with pleurisy inflammatory condition with persistent smokers cough No obvious signs of infection otherwise Recent work up at ED with negative cardiac work up and EKG Troponin and CXR negative. Reassurance today Trial on anti inflammatory today Naproxen and Baclofen, discussed dosing and follow-up if not improving  Note he has asthma and currently not wheezing, defer steroids, can return to Pulm if not improving.  Meds ordered this encounter  Medications   naproxen  (NAPROSYN) 500 MG tablet  Sig: Take 1 tablet (500 mg total) by mouth 2 (two) times daily with a meal. For 2 weeks then as needed    Dispense:  60 tablet    Refill:  1   baclofen (LIORESAL) 10 MG tablet    Sig: Take 0.5-1 tablets (5-10 mg total) by mouth 3 (three) times daily as needed for muscle spasms.    Dispense:  60 each    Refill:  1      Follow up plan: Return if symptoms worsen or fail to improve.  Nobie Putnam, DO [facility] 05/26/2022, 10:43 AM

## 2022-05-29 ENCOUNTER — Ambulatory Visit: Payer: Self-pay | Admitting: *Deleted

## 2022-05-29 ENCOUNTER — Telehealth: Payer: Self-pay | Admitting: Psychiatry

## 2022-05-29 ENCOUNTER — Telehealth: Payer: Medicare Other | Admitting: Physician Assistant

## 2022-05-29 DIAGNOSIS — J069 Acute upper respiratory infection, unspecified: Secondary | ICD-10-CM | POA: Diagnosis not present

## 2022-05-29 DIAGNOSIS — R413 Other amnesia: Secondary | ICD-10-CM

## 2022-05-29 MED ORDER — PREDNISONE 20 MG PO TABS: 20.0000 mg | ORAL_TABLET | Freq: Every day | ORAL | 0 refills | Status: DC

## 2022-05-29 MED ORDER — IPRATROPIUM BROMIDE 0.03 % NA SOLN: 2.0000 | Freq: Two times a day (BID) | NASAL | 0 refills | Status: DC

## 2022-05-29 MED ORDER — PROMETHAZINE-DM 6.25-15 MG/5ML PO SYRP: 5.0000 mL | ORAL_SOLUTION | Freq: Four times a day (QID) | ORAL | 0 refills | Status: DC | PRN

## 2022-05-29 NOTE — Telephone Encounter (Signed)
FYI

## 2022-05-29 NOTE — Telephone Encounter (Signed)
Contacted patient to discuss his labs including TSH, vitamin B12.  TSH within normal limits.  Vitamin B12-low borderline range.  Patient to have discussion with primary care provider.  Patient continues to struggle with memory problems.  We will go ahead and refer patient for neurological evaluation.

## 2022-05-29 NOTE — Telephone Encounter (Signed)
Reason for Disposition  [1] MILD difficulty breathing (e.g., minimal/no SOB at rest, SOB with walking, pulse <100) AND [2] NEW-onset or WORSE than normal  Answer Assessment - Initial Assessment Questions 1. RESPIRATORY STATUS: "Describe your breathing?" (e.g., wheezing, shortness of breath, unable to speak, severe coughing)      SOB, COVID test at home- negative 2. ONSET: "When did this breathing problem begin?"      Last night 3. PATTERN "Does the difficult breathing come and go, or has it been constant since it started?"      constant 4. SEVERITY: "How bad is your breathing?" (e.g., mild, moderate, severe)    - MILD: No SOB at rest, mild SOB with walking, speaks normally in sentences, can lie down, no retractions, pulse < 100.    - MODERATE: SOB at rest, SOB with minimal exertion and prefers to sit, cannot lie down flat, speaks in phrases, mild retractions, audible wheezing, pulse 100-120.    - SEVERE: Very SOB at rest, speaks in single words, struggling to breathe, sitting hunched forward, retractions, pulse > 120      Mild- breathing heavier 5. RECURRENT SYMPTOM: "Have you had difficulty breathing before?" If Yes, ask: "When was the last time?" and "What happened that time?"      Yes- COVID 6. CARDIAC HISTORY: "Do you have any history of heart disease?" (e.g., heart attack, angina, bypass surgery, angioplasty)      no 7. LUNG HISTORY: "Do you have any history of lung disease?"  (e.g., pulmonary embolus, asthma, emphysema)     no 8. CAUSE: "What do you think is causing the breathing problem?"      infection 9. OTHER SYMPTOMS: "Do you have any other symptoms? (e.g., dizziness, runny nose, cough, chest pain, fever)     Headache, body aches, fever, cough 10. O2 SATURATION MONITOR:  "Do you use an oxygen saturation monitor (pulse oximeter) at home?" If Yes, "What is your reading (oxygen level) today?" "What is your usual oxygen saturation reading?" (e.g., 95%)       no 11. PREGNANCY: "Is  there any chance you are pregnant?" "When was your last menstrual period?"       no 12. TRAVEL: "Have you traveled out of the country in the last month?" (e.g., travel history, exposures)       No- ER last week  Protocols used: Breathing Difficulty-A-AH

## 2022-05-29 NOTE — Progress Notes (Signed)
1. Paranoid schizophrenia (HCC)  F20.0       2. GAD (generalized anxiety disorder)  F41.1       3. Alcohol use disorder, mild, abuse  F10.10       4. Tobacco use disorder  F17.200       5. Memory loss  R41.3            6. High risk medication use  Z79.899                       Hi , Please see above for diagnosis for lab order.  Thank you

## 2022-05-29 NOTE — Patient Instructions (Signed)
Rodney [name], thank you for joining [name], PA-C for today's virtual visit.  While this provider is not your primary care provider (PCP), if your PCP is located in our provider database this encounter information will be shared with them immediately following your visit.  Consent: (Patient) Rodney [name] provided verbal consent for this virtual visit at the beginning of the encounter.  Current Medications:  Current Outpatient Medications:    ipratropium (ATROVENT) 0.03 % nasal spray, Place 2 sprays into both nostrils every 12 (twelve) hours., Disp: 30 mL, Rfl: 0   predniSONE (DELTASONE) 20 MG tablet, Take 1 tablet (20 mg total) by mouth daily with breakfast., Disp: 7 tablet, Rfl: 0   promethazine-dextromethorphan (PROMETHAZINE-DM) 6.25-15 MG/5ML syrup, Take 5 mLs by mouth 4 (four) times daily as needed., Disp: 118 mL, Rfl: 0   albuterol (PROAIR HFA) 108 (90 Base) MCG/ACT inhaler, Inhale 2 puffs into the lungs every 6 (six) hours as needed for wheezing or shortness of breath., Disp: 8.5 each, Rfl: 2   baclofen (LIORESAL) 10 MG tablet, Take 0.5-1 tablets (5-10 mg total) by mouth 3 (three) times daily as needed for muscle spasms., Disp: 60 each, Rfl: 1   benztropine (COGENTIN) 1 MG tablet, TAKE 1 TABLET (1 MG TOTAL) BY MOUTH 2 (TWO) TIMES DAILY. FOR SIDE EFFECTS OF RISPERIDONE, Disp: 180 tablet, Rfl: 0   divalproex (DEPAKOTE ER) 500 MG 24 hr tablet, TAKE 2 TABLETS (1,000 MG TOTAL) BY MOUTH AT BEDTIME., Disp: 180 tablet, Rfl: 0   Fluticasone-Umeclidin-Vilant (TRELEGY ELLIPTA) 200-62.5-25 MCG/INH AEPB, Inhale 200 mcg into the lungs daily., Disp: 28 each, Rfl: 11   gabapentin (NEURONTIN) 600 MG tablet, TAKE 1 TABLET BY MOUTH THREE TIMES A DAY, Disp: 270 tablet, Rfl: 1   naproxen (NAPROSYN) 500 MG tablet, Take 1 tablet (500 mg total) by mouth 2 (two) times daily with a meal. For 2 weeks then as needed, Disp: 60 tablet, Rfl: 1   risperiDONE (RISPERDAL) 2 MG tablet, TAKE 1 TABLET BY MOUTH AT  BEDTIME., Disp: 90 tablet, Rfl: 1   terbinafine (LAMISIL) 1 % cream, Apply topically., Disp: , Rfl:    triamcinolone (NASACORT) 55 MCG/ACT AERO nasal inhaler, PLACE 1 SPRAY INTO THE NOSE 2 (TWO) TIMES DAILY., Disp: 16.9 each, Rfl: 12   Medications ordered in this encounter:  Meds ordered this encounter  Medications   predniSONE (DELTASONE) 20 MG tablet    Sig: Take 1 tablet (20 mg total) by mouth daily with breakfast.    Dispense:  7 tablet    Refill:  0    Order Specific Question:   Supervising Provider    Answer:   MILLER, BRIAN [[id number]]   ipratropium (ATROVENT) 0.03 % nasal spray    Sig: Place 2 sprays into both nostrils every 12 (twelve) hours.    Dispense:  30 mL    Refill:  0    Order Specific Question:   Supervising Provider    Answer:   Hyacinth Meeker, BRIAN [[id number]]   promethazine-dextromethorphan (PROMETHAZINE-DM) 6.25-15 MG/5ML syrup    Sig: Take 5 mLs by mouth 4 (four) times daily as needed.    Dispense:  118 mL    Refill:  0    Order Specific Question:   Supervising Provider    Answer:   Hyacinth Meeker, BRIAN [[id number]]     *If you need refills on other medications prior to your next appointment, please contact your pharmacy*  Follow-Up: Call back or seek an in-person evaluation if the symptoms worsen or  if the condition fails to improve as anticipated.  Other Instructions Possible: COVID-19: What to Do if You Are Sick If you test positive and are an older adult or someone who is at high risk of getting very sick from COVID-19, treatment may be available. Contact a healthcare provider right away after a positive test to determine if you are eligible, even if your symptoms are mild right now. You can also visit a Test to Treat location and, if eligible, receive a prescription from a provider. Don't delay: Treatment must be started within the first few days to be effective. If you have a fever, cough, or other symptoms, you might have COVID-19. Most people have mild illness and are able to  recover at home. If you are sick: Keep track of your symptoms. If you have an emergency warning sign (including trouble breathing), call 911. Steps to help prevent the spread of COVID-19 if you are sick If you are sick with COVID-19 or think you might have COVID-19, follow the steps below to care for yourself and to help protect other people in your home and community. Stay home except to get medical care Stay home. Most people with COVID-19 have mild illness and can recover at home without medical care. Do not leave your home, except to get medical care. Do not visit public areas and do not go to places where you are unable to wear a mask. Take care of yourself. Get rest and stay hydrated. Take over-the-counter medicines, such as acetaminophen, to help you feel better. Stay in touch with your doctor. Call before you get medical care. Be sure to get care if you have trouble breathing, or have any other emergency warning signs, or if you think it is an emergency. Avoid public transportation, ride-sharing, or taxis if possible. Get tested If you have symptoms of COVID-19, get tested. While waiting for test results, stay away from others, including staying apart from those living in your household. Get tested as soon as possible after your symptoms start. Treatments may be available for people with COVID-19 who are at risk for becoming very sick. Don't delay: Treatment must be started early to be effective--some treatments must begin within 5 days of your first symptoms. Contact your healthcare provider right away if your test result is positive to determine if you are eligible. Self-tests are one of several options for testing for the virus that causes COVID-19 and may be more convenient than laboratory-based tests and point-of-care tests. Ask your healthcare provider or your local health department if you need help interpreting your test results. You can visit your state, tribal, local, and territorial  health department's website to look for the latest local information on testing sites. Separate yourself from other people As much as possible, stay in a specific room and away from other people and pets in your home. If possible, you should use a separate bathroom. If you need to be around other people or animals in or outside of the home, wear a well-fitting mask. Tell your close contacts that they may have been exposed to COVID-19. An infected person can spread COVID-19 starting 48 hours (or 2 days) before the person has any symptoms or tests positive. By letting your close contacts know they may have been exposed to COVID-19, you are helping to protect everyone. See COVID-19 and Animals if you have questions about pets. If you are diagnosed with COVID-19, someone from the health department may call you. Answer the call to slow  the spread. Monitor your symptoms Symptoms of COVID-19 include fever, cough, or other symptoms. Follow care instructions from your healthcare provider and local health department. Your local health authorities may give instructions on checking your symptoms and reporting information. When to seek emergency medical attention Look for emergency warning signs* for COVID-19. If someone is showing any of these signs, seek emergency medical care immediately: Trouble breathing Persistent pain or pressure in the chest New confusion Inability to wake or stay awake Pale, gray, or blue-colored skin, lips, or nail beds, depending on skin tone *This list is not all possible symptoms. Please call your medical provider for any other symptoms that are severe or concerning to you. Call 911 or call ahead to your local emergency facility: Notify the operator that you are seeking care for someone who has or may have COVID-19. Call ahead before visiting your doctor Call ahead. Many medical visits for routine care are being postponed or done by phone or telemedicine. If you have a medical  appointment that cannot be postponed, call your doctor's office, and tell them you have or may have COVID-19. This will help the office protect themselves and other patients. If you are sick, wear a well-fitting mask You should wear a mask if you must be around other people or animals, including pets (even at home). Wear a mask with the best fit, protection, and comfort for you. You don't need to wear the mask if you are alone. If you can't put on a mask (because of trouble breathing, for example), cover your coughs and sneezes in some other way. Try to stay at least 6 feet away from other people. This will help protect the people around you. Masks should not be placed on young children under age 18 years, anyone who has trouble breathing, or anyone who is not able to remove the mask without help. Cover your coughs and sneezes Cover your mouth and nose with a tissue when you cough or sneeze. Throw away used tissues in a lined trash can. Immediately wash your hands with soap and water for at least 20 seconds. If soap and water are not available, clean your hands with an alcohol-based hand sanitizer that contains at least 60% alcohol. Clean your hands often Wash your hands often with soap and water for at least 20 seconds. This is especially important after blowing your nose, coughing, or sneezing; going to the bathroom; and before eating or preparing food. Use hand sanitizer if soap and water are not available. Use an alcohol-based hand sanitizer with at least 60% alcohol, covering all surfaces of your hands and rubbing them together until they feel dry. Soap and water are the best option, especially if hands are visibly dirty. Avoid touching your eyes, nose, and mouth with unwashed hands. Handwashing Tips Avoid sharing personal household items Do not share dishes, drinking glasses, cups, eating utensils, towels, or bedding with other people in your home. Wash these items thoroughly after using them  with soap and water or put in the dishwasher. Clean surfaces in your home regularly Clean and disinfect high-touch surfaces (for example, doorknobs, tables, handles, light switches, and countertops) in your "sick room" and bathroom. In shared spaces, you should clean and disinfect surfaces and items after each use by the person who is ill. If you are sick and cannot clean, a caregiver or other person should only clean and disinfect the area around you (such as your bedroom and bathroom) on an as needed basis. Your caregiver/other person  should wait as long as possible (at least several hours) and wear a mask before entering, cleaning, and disinfecting shared spaces that you use. Clean and disinfect areas that may have blood, stool, or body fluids on them. Use household cleaners and disinfectants. Clean visible dirty surfaces with household cleaners containing soap or detergent. Then, use a household disinfectant. Use a product from [employer] List N: Disinfectants for Coronavirus (COVID-19). Be sure to follow the instructions on the label to ensure safe and effective use of the product. Many products recommend keeping the surface wet with a disinfectant for a certain period of time (look at "contact time" on the product label). You may also need to wear personal protective equipment, such as gloves, depending on the directions on the product label. Immediately after disinfecting, wash your hands with soap and water for 20 seconds. For completed guidance on cleaning and disinfecting your home, visit Complete Disinfection Guidance. Take steps to improve ventilation at home Improve ventilation (air flow) at home to help prevent from spreading COVID-19 to other people in your household. Clear out COVID-19 virus particles in the air by opening windows, using air filters, and turning on fans in your home. Use this interactive tool to learn how to improve air flow in your home. When you can be around others after  being sick with COVID-19 Deciding when you can be around others is different for different situations. Find out when you can safely end home isolation. For any additional questions about your care, contact your healthcare provider or state or local health department. 03/15/2021 Content source: [facility] ([facility]), Division of Viral Diseases This information is not intended to replace advice given to you by your health care provider. Make sure you discuss any questions you have with your health care provider. Document Revised: 09/02/2021 Document Reviewed: 09/02/2021 Elsevier Patient Education  2022 [employer].    If you have been instructed to have an in-person evaluation today at a local Urgent Care facility, please use the link below. It will take you to a list of all of our available [facility], including address, phone number and hours of operation. Please do not delay care.  [facility]  If you or a family member do not have a primary care provider, use the link below to schedule a visit and establish care. When you choose a [facility] primary care physician or advanced practice provider, you gain a long-term partner in health. Find a Primary Care Provider  Learn more about [facility]'s in-office and virtual care options: [facility] - Get Care Now

## 2022-05-29 NOTE — Telephone Encounter (Signed)
  Chief Complaint: SOB- heavier breathing than normal- negative home COVID test Symptoms: fever, body aches, congestion, cough, headache Frequency: symptoms started last night Pertinent Negatives: Patient denies heart history Disposition: [] ED /[] Urgent Care (no appt availability in office) / [] Appointment(In office/virtual)/ [x]  [facility]/ [] Home Care/ [] Refused Recommended Disposition /[] [facility]/ []  Follow-up with PCP Additional Notes: no oepn appointment- scheduled virtual visit/UC

## 2022-05-29 NOTE — Telephone Encounter (Signed)
Referral made info sent

## 2022-05-29 NOTE — Progress Notes (Signed)
Virtual Visit Consent   Rodney [name], you are scheduled for a virtual visit with a [facility] provider today. Just as with appointments in the office, your consent must be obtained to participate. Your consent will be active for this visit and any virtual visit you may have with one of our providers in the next 365 days. If you have a MyChart account, a copy of this consent can be sent to you electronically.  As this is a virtual visit, video technology does not allow for your provider to perform a traditional examination. This may limit your provider's ability to fully assess your condition. If your provider identifies any concerns that need to be evaluated in person or the need to arrange testing (such as labs, EKG, etc.), we will make arrangements to do so. Although advances in technology are sophisticated, we cannot ensure that it will always work on either your end or our end. If the connection with a video visit is poor, the visit may have to be switched to a telephone visit. With either a video or telephone visit, we are not always able to ensure that we have a secure connection.  By engaging in this virtual visit, you consent to the provision of healthcare and authorize for your insurance to be billed (if applicable) for the services provided during this visit. Depending on your insurance coverage, you may receive a charge related to this service.  I need to obtain your verbal consent now. Are you willing to proceed with your visit today? Rodney [name] has provided verbal consent on 05/29/2022 for a virtual visit (video or telephone). Rodney LovelessJennifer M Preston Weill, PA-C  Date: 05/29/2022 5:09 PM  Virtual Visit via Video Note   I, Rodney [name], connected with  Rodney [name]  ([id number], Feb 11, 1986) on 05/29/22 at  5:00 PM EDT by a video-enabled telemedicine application and verified that I am speaking with the correct person using two identifiers.  Location: Patient: Virtual Visit Location Patient:  Home Provider: Virtual Visit Location Provider: Home Office   I discussed the limitations of evaluation and management by telemedicine and the availability of in person appointments. The patient expressed understanding and agreed to proceed.    History of Present Illness: Rodney [name] is a [age] y.o. who identifies as a male who was assigned male at birth, and is being seen today for cough.  HPI: Cough This is a new problem. The current episode started yesterday. The problem has been gradually worsening. The problem occurs constantly. The cough is Non-productive. Associated symptoms include chills, ear congestion, ear pain, a fever (100.4), headaches, myalgias, nasal congestion, postnasal drip, rhinorrhea, a sore throat, sweats and wheezing. Associated symptoms comments: fatigue. The symptoms are aggravated by lying down (cigarette smoke). Risk factors for lung disease include smoking/tobacco exposure. He has tried nothing for the symptoms. The treatment provided no relief. His past medical history is significant for asthma, bronchitis (when he was younger) and environmental allergies. There is no history of pneumonia.   At home covid testing is negative.  Problems:  Patient Active Problem List   Diagnosis Date Noted   Memory loss 05/23/2022   High risk medication use 05/23/2022   Bladder wall thickening 08/06/2020   GAD (generalized anxiety disorder) 09/02/2019   Cannabis use disorder, mild, in sustained remission 09/02/2019   Alcohol use disorder, moderate, dependence (HCC) 02/18/2019   Asthma with COPD (HCC) 09/26/2018   Paranoid schizophrenia (HCC) 02/05/2018   Vitamin D deficiency 08/09/2015  Reactive airway disease with status asthmaticus 08/09/2015   Cigarette nicotine dependence with nicotine-induced disorder 09/14/2014   Alcohol use disorder, mild, abuse 02/08/2011   Tobacco use disorder 02/08/2011   Allergic rhinitis 02/08/2011   SCHIZOPHRENIA, HX OF 02/08/2011    Allergies:   Allergies  Allergen Reactions   Hydroxyzine Hcl Rash   Medications:  Current Outpatient Medications:    ipratropium (ATROVENT) 0.03 % nasal spray, Place 2 sprays into both nostrils every 12 (twelve) hours., Disp: 30 mL, Rfl: 0   predniSONE (DELTASONE) 20 MG tablet, Take 1 tablet (20 mg total) by mouth daily with breakfast., Disp: 7 tablet, Rfl: 0   promethazine-dextromethorphan (PROMETHAZINE-DM) 6.25-15 MG/5ML syrup, Take 5 mLs by mouth 4 (four) times daily as needed., Disp: 118 mL, Rfl: 0   albuterol (PROAIR HFA) 108 (90 Base) MCG/ACT inhaler, Inhale 2 puffs into the lungs every 6 (six) hours as needed for wheezing or shortness of breath., Disp: 8.5 each, Rfl: 2   baclofen (LIORESAL) 10 MG tablet, Take 0.5-1 tablets (5-10 mg total) by mouth 3 (three) times daily as needed for muscle spasms., Disp: 60 each, Rfl: 1   benztropine (COGENTIN) 1 MG tablet, TAKE 1 TABLET (1 MG TOTAL) BY MOUTH 2 (TWO) TIMES DAILY. FOR SIDE EFFECTS OF RISPERIDONE, Disp: 180 tablet, Rfl: 0   divalproex (DEPAKOTE ER) 500 MG 24 hr tablet, TAKE 2 TABLETS (1,000 MG TOTAL) BY MOUTH AT BEDTIME., Disp: 180 tablet, Rfl: 0   Fluticasone-Umeclidin-Vilant (TRELEGY ELLIPTA) 200-62.5-25 MCG/INH AEPB, Inhale 200 mcg into the lungs daily., Disp: 28 each, Rfl: 11   gabapentin (NEURONTIN) 600 MG tablet, TAKE 1 TABLET BY MOUTH THREE TIMES A DAY, Disp: 270 tablet, Rfl: 1   naproxen (NAPROSYN) 500 MG tablet, Take 1 tablet (500 mg total) by mouth 2 (two) times daily with a meal. For 2 weeks then as needed, Disp: 60 tablet, Rfl: 1   risperiDONE (RISPERDAL) 2 MG tablet, TAKE 1 TABLET BY MOUTH AT BEDTIME., Disp: 90 tablet, Rfl: 1   terbinafine (LAMISIL) 1 % cream, Apply topically., Disp: , Rfl:    triamcinolone (NASACORT) 55 MCG/ACT AERO nasal inhaler, PLACE 1 SPRAY INTO THE NOSE 2 (TWO) TIMES DAILY., Disp: 16.9 each, Rfl: 12  Observations/Objective: Patient is well-developed, well-nourished in no acute distress.  Resting comfortably at  home.  Head is normocephalic, atraumatic.  No labored breathing.  Speech is clear and coherent with logical content.  Patient is alert and oriented at baseline.    Assessment and Plan: 1. Viral URI with cough - predniSONE (DELTASONE) 20 MG tablet; Take 1 tablet (20 mg total) by mouth daily with breakfast.  Dispense: 7 tablet; Refill: 0 - ipratropium (ATROVENT) 0.03 % nasal spray; Place 2 sprays into both nostrils every 12 (twelve) hours.  Dispense: 30 mL; Refill: 0 - promethazine-dextromethorphan (PROMETHAZINE-DM) 6.25-15 MG/5ML syrup; Take 5 mLs by mouth 4 (four) times daily as needed.  Dispense: 118 mL; Refill: 0  - Worsening symptoms that have not responded to OTC medications.  - Will give Prednisone, Ipratropium and Promethazine DM - Continue allergy medications.  - Steam and humidifier can help - Stay well hydrated and get plenty of rest.  - Re-test for covid 19 in 24-48 hours - Seek in person evaluation if no symptom improvement or if symptoms worsen   Follow Up Instructions: I discussed the assessment and treatment plan with the patient. The patient was provided an opportunity to ask questions and all were answered. The patient agreed with the plan and demonstrated an  understanding of the instructions.  A copy of instructions were sent to the patient via MyChart unless otherwise noted below.    The patient was advised to call back or seek an in-person evaluation if the symptoms worsen or if the condition fails to improve as anticipated.  Time:  I spent 13 minutes with the patient via telehealth technology discussing the above problems/concerns.    Rodney Loveless, PA-C

## 2022-06-17 ENCOUNTER — Other Ambulatory Visit: Payer: Self-pay | Admitting: Family Medicine

## 2022-06-17 DIAGNOSIS — F411 Generalized anxiety disorder: Secondary | ICD-10-CM

## 2022-06-20 ENCOUNTER — Ambulatory Visit (INDEPENDENT_AMBULATORY_CARE_PROVIDER_SITE_OTHER): Payer: Medicare Other | Admitting: Psychiatry

## 2022-06-20 ENCOUNTER — Encounter: Payer: Self-pay | Admitting: Psychiatry

## 2022-06-20 VITALS — BP 124/78 | HR 71 | Temp 98.5°F | Wt 218.2 lb

## 2022-06-20 DIAGNOSIS — F411 Generalized anxiety disorder: Secondary | ICD-10-CM | POA: Diagnosis not present

## 2022-06-20 DIAGNOSIS — F1010 Alcohol abuse, uncomplicated: Secondary | ICD-10-CM

## 2022-06-20 DIAGNOSIS — F17200 Nicotine dependence, unspecified, uncomplicated: Secondary | ICD-10-CM

## 2022-06-20 DIAGNOSIS — F200 Paranoid schizophrenia: Secondary | ICD-10-CM

## 2022-06-20 DIAGNOSIS — R413 Other amnesia: Secondary | ICD-10-CM

## 2022-06-20 DIAGNOSIS — Z79899 Other long term (current) drug therapy: Secondary | ICD-10-CM

## 2022-06-20 MED ORDER — BUSPIRONE HCL 10 MG PO TABS: 10.0000 mg | ORAL_TABLET | Freq: Every day | ORAL | 0 refills | Status: DC

## 2022-06-20 MED ORDER — DIVALPROEX SODIUM ER 500 MG PO TB24: 1000.0000 mg | ORAL_TABLET | Freq: Every day | ORAL | 0 refills | Status: DC

## 2022-06-20 MED ORDER — BENZTROPINE MESYLATE 1 MG PO TABS: 1.0000 mg | ORAL_TABLET | Freq: Two times a day (BID) | ORAL | 0 refills | Status: DC

## 2022-06-20 NOTE — Progress Notes (Signed)
BH MD OP Progress Note  06/20/2022 5:17 PM Rodney [name]  MRN:  [id number]  Chief Complaint:  Chief Complaint  Patient presents with   Follow-up: 36 year old Caucasian male with history of schizophrenia, generalized anxiety disorder, alcoholism, presented for medication management.   HPI: Rodney [name] is a 36 year old Caucasian male, employed, lives in [place], married, has a history of schizophrenia, GAD, alcohol abuse, cannabis use disorder in remission, tobacco use disorder was evaluated in office today.  Patient reports he currently struggles with anxiety especially work-related.  This has been going on since the past several months, getting worse.  He is currently at this new job and has been, since the past 5 months.  Patient reports when he gets back home he still cannot relax since his children are home and they are on summer break.  He enjoys spending time with them however he would also like to have some time to himself to relax.  Patient wonders if he could be started on a medication to help with his anxiety.  Patient reports otherwise he is doing fairly well on the current medication regimen.  Denies any significant depression, denies any hallucinations or paranoia.  Reports sleep is good.  Continues to struggle with memory problems, was referred to a neurologist last visit, has upcoming appointment.  Reviewed and discussed most recent labs including vitamin B12, TSH level.  Agreeable to discuss with his primary care provider regarding vitamin B12 replacement.  Patient reports he has been using alcohol every day, 2-3 drinks of beer.  May drink more on weekends.  Patient provided counseling, receptive.  Denies suicidality, homicidality or perceptual disturbances.  Patient denies any side effects to medications.  Denies any other concerns today.  Visit Diagnosis:    ICD-10-CM   1. Paranoid schizophrenia (HCC)  F20.0 divalproex (DEPAKOTE ER) 500 MG 24 hr tablet    benztropine  (COGENTIN) 1 MG tablet    2. GAD (generalized anxiety disorder)  F41.1 busPIRone (BUSPAR) 10 MG tablet    3. Alcohol use disorder, mild, abuse  F10.10     4. Tobacco use disorder  F17.200     5. Memory loss  R41.3     6. High risk medication use  Z79.899       Past Psychiatric History: Reviewed past psychiatric history from progress note on 09/23/2018.  Past Medical History:  Past Medical History:  Diagnosis Date   Anxiety    Bipolar disorder (HCC)    CIGARETTE SMOKER 02/08/2011   Qualifier: Diagnosis of  By: Laural Benes MD, [place]     Seizures [facility])     Past Surgical History:  Procedure Laterality Date   WISDOM TOOTH EXTRACTION      Family Psychiatric History: Reviewed family psychiatric history from progress note on 09/23/2018.  Family History:  Family History  Problem Relation Age of Onset   Anxiety disorder Mother    OCD Brother    Anxiety disorder Maternal Aunt    Anxiety disorder Maternal Grandmother    Anxiety disorder Paternal Grandfather     Social History: Reviewed social history from progress note on 09/23/2018. Social History   Socioeconomic History   Marital status: Significant Other    Spouse name: Not on file   Number of children: 1   Years of education: Not on file   Highest education level: Associate degree: occupational, Scientist, product/process development, or vocational program  Occupational History   Occupation: Location manager     Comment: [place]   Tobacco Use  Smoking status: Every Day    Packs/day: 1.50    Years: 20.00    Total pack years: 30.00    Types: Cigarettes   Smokeless tobacco: Never   Tobacco comments:    1-1.5PPD 03/10/2021  Vaping Use   Vaping Use: Former  Substance and Sexual Activity   Alcohol use: Yes    Alcohol/week: 35.0 standard drinks of alcohol    Types: 35 Cans of beer per week    Comment: 6 drinks a day   Drug use: Not Currently    Types: Marijuana, Other-see comments, LSD    Comment: in past   Sexual activity: Yes  Other  Topics Concern   Not on file  Social History Narrative   Not on file   Social Determinants of Health   Financial Resource Strain: Low Risk  (09/20/2021)   Overall Financial Resource Strain (CARDIA)    Difficulty of Paying Living Expenses: Not hard at all  Food Insecurity: No Food Insecurity (09/20/2021)   Hunger Vital Sign    Worried About Running Out of Food in the Last Year: Never true    Ran Out of Food in the Last Year: Never true  Transportation Needs: No Transportation Needs (09/20/2021)   PRAPARE - Administrator, Civil Service (Medical): No    Lack of Transportation (Non-Medical): No  Physical Activity: Inactive (09/20/2021)   Exercise Vital Sign    Days of Exercise per Week: 0 days    Minutes of Exercise per Session: 0 min  Stress: No Stress Concern Present (09/20/2021)   [employer] of Occupational Health - Occupational Stress Questionnaire    Feeling of Stress : Not at all  Social Connections: Moderately Isolated (09/23/2018)   Social Connection and Isolation Panel [NHANES]    Frequency of Communication with Friends and Family: Once a week    Frequency of Social Gatherings with Friends and Family: Never    Attends Religious Services: Never    Database administrator or Organizations: No    Attends Banker Meetings: Never    Marital Status: Living with partner    Allergies:  Allergies  Allergen Reactions   Hydroxyzine Hcl Rash    Metabolic Disorder Labs: Lab Results  Component Value Date   HGBA1C 5.3 03/04/2020   MPG 105 03/04/2020   MPG 100 02/18/2019   No results found for: "PROLACTIN" Lab Results  Component Value Date   CHOL 173 03/04/2020   TRIG 49 03/04/2020   HDL 75 03/04/2020   CHOLHDL 2.3 03/04/2020   LDLCALC 84 03/04/2020   LDLCALC 83 02/18/2019   Lab Results  Component Value Date   TSH 3.543 05/24/2022   TSH 1.40 02/18/2019    Therapeutic Level Labs: No results found for: "LITHIUM" Lab Results  Component  Value Date   VALPROATE 61 05/24/2022   VALPROATE 84.0 03/04/2020   No results found for: "CBMZ"  Current Medications: Current Outpatient Medications  Medication Sig Dispense Refill   albuterol (PROAIR HFA) 108 (90 Base) MCG/ACT inhaler Inhale 2 puffs into the lungs every 6 (six) hours as needed for wheezing or shortness of breath. 8.5 each 2   busPIRone (BUSPAR) 10 MG tablet Take 1 tablet (10 mg total) by mouth daily after lunch. 90 tablet 0   Fluticasone-Umeclidin-Vilant (TRELEGY ELLIPTA) 200-62.5-25 MCG/INH AEPB Inhale 200 mcg into the lungs daily. 28 each 11   gabapentin (NEURONTIN) 600 MG tablet TAKE 1 TABLET BY MOUTH THREE TIMES A DAY 270 tablet 1  risperiDONE (RISPERDAL) 2 MG tablet TAKE 1 TABLET BY MOUTH EVERYDAY AT BEDTIME 90 tablet 1   terbinafine (LAMISIL) 1 % cream Apply topically.     triamcinolone (NASACORT) 55 MCG/ACT AERO nasal inhaler PLACE 1 SPRAY INTO THE NOSE 2 (TWO) TIMES DAILY. 16.9 each 12   benztropine (COGENTIN) 1 MG tablet Take 1 tablet (1 mg total) by mouth 2 (two) times daily. For side effects of risperidone 180 tablet 0   divalproex (DEPAKOTE ER) 500 MG 24 hr tablet Take 2 tablets (1,000 mg total) by mouth at bedtime. 180 tablet 0   ipratropium (ATROVENT) 0.03 % nasal spray Place 2 sprays into both nostrils every 12 (twelve) hours. (Patient not taking: Reported on 06/20/2022) 30 mL 0   No current facility-administered medications for this visit.     Musculoskeletal: Strength & Muscle Tone: within normal limits Gait & Station: normal Patient leans: N/A  Psychiatric Specialty Exam: Review of Systems  Psychiatric/Behavioral:  The patient is nervous/anxious.   All other systems reviewed and are negative.   Blood pressure 124/78, pulse 71, temperature 98.5 F (36.9 C), temperature source Temporal, weight 218 lb 3.2 oz (99 kg).Body mass index is 25.87 kg/m.  General Appearance: Casual  Eye Contact:  Fair  Speech:  Normal Rate  Volume:  Normal  Mood:   Anxious  Affect:  Restricted  Thought Process:  Goal Directed and Descriptions of Associations: Intact  Orientation:  Full (Time, Place, and Person)  Thought Content: Logical   Suicidal Thoughts:  No  Homicidal Thoughts:  No  Memory:  Immediate;   Fair Recent;   Fair Remote;   Limited  Judgement:  Fair  Insight:  Fair  Psychomotor Activity:  Normal  Concentration:  Concentration: Fair and Attention Span: Fair  Recall:  [employer] of Knowledge: Fair  Language: Fair  Akathisia:  No  Handed:  Right  AIMS (if indicated): done  Assets:  Communication Skills Desire for Improvement Housing Intimacy Social Support Talents/Skills Transportation  ADL's:  Intact  Cognition: Limited  Sleep:  Fair   Screenings: AIMS    Flowsheet Row Office Visit from 06/20/2022 in [facility] Office Visit from 05/23/2022 in [facility] Office Visit from 10/23/2018 in [facility]  AIMS Total Score 0 0 3      GAD-7    Flowsheet Row Office Visit from 06/20/2022 in [facility] Office Visit from 05/26/2022 in [facility] Office Visit from 05/23/2022 in [facility] Video Visit from 02/22/2022 in [facility] Office Visit from 01/25/2022 in [facility]  Total GAD-7 Score 6 4 12 6 7       PHQ2-9    Flowsheet Row Office Visit from 06/20/2022 in [facility] Office Visit from 05/26/2022 in [facility] Office Visit from 05/23/2022 in [facility] Video Visit from 02/22/2022 in [facility] Office Visit from 01/25/2022 in [place]  PHQ-2 Total Score 0 0 1 0 0  PHQ-9 Total Score 1 1 2 1 1       Flowsheet Row Office Visit from 06/20/2022 in [facility] Most recent reading at 06/20/2022  4:48 PM Office  Visit from 05/23/2022 in [facility] Most recent reading at 05/23/2022  4:59 PM ED from 05/23/2022 in [facility] EMERGENCY DEPARTMENT Most recent reading at 05/23/2022 10:56 AM  C-SSRS RISK CATEGORY No Risk No  Risk No Risk        Assessment and Plan: Rodney [name] is a 36 year old Caucasian male, [place] in [place], has a history of schizophrenia, alcoholism, cannabis use in remission, anxiety disorder, tobacco use disorder was evaluated in office today.  Patient is currently struggling with anxiety, memory problems, will benefit from the following plan.  Plan Schizophrenia-stable Risperidone 2 mg p.o. nightly Cogentin 1 mg p.o. twice daily as needed Depakote ER 1000 mg p.o. daily   GAD-unstable Gabapentin 600 mg p.o. 3 times daily Start BuSpar 10 mg p.o. daily after lunch Patient was provided resources for therapist last visit-noncompliant.  Memory loss-unstable Referred for neurology consultation-patient has upcoming appointment. Reviewed and discussed labs including vitamin B12-228-low borderline-patient to follow up with primary care provider for replacement. Reviewed and discussed TSH-dated 05/24/2022-within normal limits.  Tobacco use disorder-unstable Provided counseling for 1 minute.  Alcohol use disorder mild abuse-unstable Provided counseling , advised not to mix his medications with alcohol.  High risk medication use-reviewed and discussed labs including Depakote level-dated 05/24/2022-61-therapeutic. Sodium-137-within normal limits, platelet count-within normal limits.  Hepatic function panel-within normal limits.  Follow-up in clinic in 4 weeks or sooner if needed.    This note was generated in part or whole with voice recognition software. Voice recognition is usually quite accurate but there are transcription errors that can and very often do occur. I apologize for any typographical errors that were not detected and  corrected.    Jomarie Longs, MD 06/21/2022, 12:07 PM

## 2022-07-03 DIAGNOSIS — E538 Deficiency of other specified B group vitamins: Secondary | ICD-10-CM | POA: Diagnosis not present

## 2022-07-03 DIAGNOSIS — R413 Other amnesia: Secondary | ICD-10-CM | POA: Diagnosis not present

## 2022-07-21 ENCOUNTER — Other Ambulatory Visit: Payer: Self-pay | Admitting: Physician Assistant

## 2022-07-21 DIAGNOSIS — R413 Other amnesia: Secondary | ICD-10-CM

## 2022-07-28 ENCOUNTER — Telehealth: Payer: Self-pay | Admitting: Pulmonary Disease

## 2022-07-28 NOTE — Telephone Encounter (Signed)
Lm for patient.  

## 2022-07-28 NOTE — Telephone Encounter (Signed)
Spoke to patient.  He reports of lump on right side of chest. Lump is sore to touch. Denied redness or heat x2w. C/o prod cough with clear to yellow sputum, occ wheezing and occ SOB with exertion. Sx have slightly worsened over the past 2 weeks. Chest is sore when coughing.  He has not contacted PCP.   Dr. Jayme Cloud, please advise. thanks

## 2022-07-28 NOTE — Telephone Encounter (Signed)
Patient is aware of recommendations and voiced his understanding.  Nothing further needed. 

## 2022-07-28 NOTE — Telephone Encounter (Signed)
The lump in his chest may be either in a muscle or the tissues themselves.  I do not think this is coming from the lung.  My recommendation would be to either contact primary care or go to urgent care.  He will likely need imaging.

## 2022-07-29 ENCOUNTER — Ambulatory Visit
Admission: RE | Admit: 2022-07-29 | Discharge: 2022-07-29 | Disposition: A | Discharge status: Home / Self Care | Payer: Medicare Other | Source: Ambulatory Visit | Attending: Physician Assistant | Admitting: Physician Assistant

## 2022-07-29 DIAGNOSIS — R413 Other amnesia: Secondary | ICD-10-CM | POA: Insufficient documentation

## 2022-07-29 MED ORDER — GADOBUTROL 1 MMOL/ML IV SOLN
10.0000 mL | Freq: Once | INTRAVENOUS | Status: AC | PRN
  Administered 2022-07-29: 10 mL via INTRAVENOUS

## 2022-08-01 ENCOUNTER — Telehealth (INDEPENDENT_AMBULATORY_CARE_PROVIDER_SITE_OTHER): Payer: Medicare Other | Admitting: Psychiatry

## 2022-08-01 ENCOUNTER — Encounter: Payer: Self-pay | Admitting: Psychiatry

## 2022-08-01 DIAGNOSIS — F200 Paranoid schizophrenia: Secondary | ICD-10-CM

## 2022-08-01 DIAGNOSIS — F1010 Alcohol abuse, uncomplicated: Secondary | ICD-10-CM | POA: Diagnosis not present

## 2022-08-01 DIAGNOSIS — F411 Generalized anxiety disorder: Secondary | ICD-10-CM | POA: Diagnosis not present

## 2022-08-01 DIAGNOSIS — R413 Other amnesia: Secondary | ICD-10-CM

## 2022-08-01 DIAGNOSIS — F17200 Nicotine dependence, unspecified, uncomplicated: Secondary | ICD-10-CM

## 2022-08-01 NOTE — Progress Notes (Unsigned)
Virtual Visit via Video Note  I connected with Rodney [name] on 08/01/22 at  4:30 PM EDT by a video enabled telemedicine application and verified that I am speaking with the correct person using two identifiers.  Location Provider Location : [facility] Patient Location : Car  Participants: Patient , Provider    I discussed the limitations of evaluation and management by telemedicine and the availability of in person appointments. The patient expressed understanding and agreed to proceed.   I discussed the assessment and treatment plan with the patient. The patient was provided an opportunity to ask questions and all were answered. The patient agreed with the plan and demonstrated an understanding of the instructions.   The patient was advised to call back or seek an in-person evaluation if the symptoms worsen or if the condition fails to improve as anticipated.  BH MD OP Progress Note  08/02/2022 1:39 PM Rodney [name]  MRN:  [id number]  Chief Complaint:  Chief Complaint  Patient presents with   Follow-up: 36 year old Caucasian male with history of schizophrenia, anxiety, alcoholism, presented for medication management.   HPI: Rodney [name] is a 36 year old Caucasian male, employed, lives in [place], married, has a history of schizophrenia, GAD, alcohol abuse, cannabis abuse in remission, tobacco use disorder was evaluated by telemedicine today.  Patient today reports overall mood symptoms are stable.  His anxiety has improved on the BuSpar.  He reports he is getting used to his new job requirements and is doing fairly well.  Life at home is also going well since being on the BuSpar.  Patient however continues to use alcohol on a regular basis.  Reports he drinks 2 to 3,12 ounce beers 2-3 times a week during weekdays and then on the weekends on Friday and Saturday he has 6-8, 12 ounce beers per day.  Patient continues to have memory problems, unknown if his alcoholism could be contributing to some of  his problems.  Patient had recent workup including MRI of his brain completed and has upcoming appointment with neurologist.  Patient currently denies any side effects to his medications.  Reports sleep as good.  Denies any suicidality, homicidality or perceptual disturbances.  Patient denies any other concerns today.  Visit Diagnosis:    ICD-10-CM   1. Paranoid schizophrenia (HCC)  F20.0     2. GAD (generalized anxiety disorder)  F41.1     3. Alcohol use disorder, mild, abuse  F10.10     4. Tobacco use disorder  F17.200     5. Memory loss  R41.3       Past Psychiatric History: Reviewed past psychiatric history from progress note on 09/23/2018.  Past Medical History:  Past Medical History:  Diagnosis Date   Anxiety    Bipolar disorder (HCC)    CIGARETTE SMOKER 02/08/2011   Qualifier: Diagnosis of  By: Laural Benes MD, [place]     Seizures [facility])     Past Surgical History:  Procedure Laterality Date   WISDOM TOOTH EXTRACTION      Family Psychiatric History: Reviewed family psychiatric history from progress note on 09/23/2018.  Family History:  Family History  Problem Relation Age of Onset   Anxiety disorder Mother    OCD Brother    Anxiety disorder Maternal Aunt    Anxiety disorder Maternal Grandmother    Anxiety disorder Paternal Grandfather     Social History: Reviewed social history from progress note on 09/23/2018. Social History   Socioeconomic History   Marital status: Significant  Other    Spouse name: Not on file   Number of children: 1   Years of education: Not on file   Highest education level: Associate degree: occupational, Scientist, product/process development, or vocational program  Occupational History   Occupation: Location manager     Comment: [employer]   Tobacco Use   Smoking status: Every Day    Packs/day: 1.50    Years: 20.00    Total pack years: 30.00    Types: Cigarettes   Smokeless tobacco: Never   Tobacco comments:    1-1.5PPD 03/10/2021  Vaping Use   Vaping  Use: Former  Substance and Sexual Activity   Alcohol use: Yes    Alcohol/week: 35.0 standard drinks of alcohol    Types: 35 Cans of beer per week    Comment: 6 drinks a day   Drug use: Not Currently    Types: Marijuana, Other-see comments, LSD    Comment: in past   Sexual activity: Yes  Other Topics Concern   Not on file  Social History Narrative   Not on file   Social Determinants of Health   Financial Resource Strain: Low Risk  (09/20/2021)   Overall Financial Resource Strain (CARDIA)    Difficulty of Paying Living Expenses: Not hard at all  Food Insecurity: No Food Insecurity (09/20/2021)   Hunger Vital Sign    Worried About Running Out of Food in the Last Year: Never true    Ran Out of Food in the Last Year: Never true  Transportation Needs: No Transportation Needs (09/20/2021)   PRAPARE - Administrator, Civil Service (Medical): No    Lack of Transportation (Non-Medical): No  Physical Activity: Inactive (09/20/2021)   Exercise Vital Sign    Days of Exercise per Week: 0 days    Minutes of Exercise per Session: 0 min  Stress: No Stress Concern Present (09/20/2021)   [employer] of Occupational Health - Occupational Stress Questionnaire    Feeling of Stress : Not at all  Social Connections: Moderately Isolated (09/23/2018)   Social Connection and Isolation Panel [NHANES]    Frequency of Communication with Friends and Family: Once a week    Frequency of Social Gatherings with Friends and Family: Never    Attends Religious Services: Never    Database administrator or Organizations: No    Attends Banker Meetings: Never    Marital Status: Living with partner    Allergies:  Allergies  Allergen Reactions   Hydroxyzine Hcl Rash    Metabolic Disorder Labs: Lab Results  Component Value Date   HGBA1C 5.3 03/04/2020   MPG 105 03/04/2020   MPG 100 02/18/2019   No results found for: "PROLACTIN" Lab Results  Component Value Date   CHOL 173  03/04/2020   TRIG 49 03/04/2020   HDL 75 03/04/2020   CHOLHDL 2.3 03/04/2020   LDLCALC 84 03/04/2020   LDLCALC 83 02/18/2019   Lab Results  Component Value Date   TSH 3.543 05/24/2022   TSH 1.40 02/18/2019    Therapeutic Level Labs: No results found for: "LITHIUM" Lab Results  Component Value Date   VALPROATE 61 05/24/2022   VALPROATE 84.0 03/04/2020   No results found for: "CBMZ"  Current Medications: Current Outpatient Medications  Medication Sig Dispense Refill   albuterol (PROAIR HFA) 108 (90 Base) MCG/ACT inhaler Inhale 2 puffs into the lungs every 6 (six) hours as needed for wheezing or shortness of breath. 8.5 each 2   benztropine (COGENTIN)  1 MG tablet Take 1 tablet (1 mg total) by mouth 2 (two) times daily. For side effects of risperidone 180 tablet 0   busPIRone (BUSPAR) 10 MG tablet Take 1 tablet (10 mg total) by mouth daily after lunch. 90 tablet 0   divalproex (DEPAKOTE ER) 500 MG 24 hr tablet Take 2 tablets (1,000 mg total) by mouth at bedtime. 180 tablet 0   Fluticasone-Umeclidin-Vilant (TRELEGY ELLIPTA) 200-62.5-25 MCG/INH AEPB Inhale 200 mcg into the lungs daily. 28 each 11   gabapentin (NEURONTIN) 600 MG tablet TAKE 1 TABLET BY MOUTH THREE TIMES A DAY 270 tablet 1   ipratropium (ATROVENT) 0.03 % nasal spray Place 2 sprays into both nostrils every 12 (twelve) hours. (Patient not taking: Reported on 06/20/2022) 30 mL 0   risperiDONE (RISPERDAL) 2 MG tablet TAKE 1 TABLET BY MOUTH EVERYDAY AT BEDTIME 90 tablet 1   terbinafine (LAMISIL) 1 % cream Apply topically.     triamcinolone (NASACORT) 55 MCG/ACT AERO nasal inhaler PLACE 1 SPRAY INTO THE NOSE 2 (TWO) TIMES DAILY. 16.9 each 12   No current facility-administered medications for this visit.     Musculoskeletal: Strength & Muscle Tone:  UTA Gait & Station:  Seated Patient leans: N/A  Psychiatric Specialty Exam: Review of Systems  Psychiatric/Behavioral: Negative.    All other systems reviewed and are  negative.   There were no vitals taken for this visit.There is no height or weight on file to calculate BMI.  General Appearance: Casual  Eye Contact:  Good  Speech:  Clear and Coherent  Volume:  Normal  Mood:  Euthymic  Affect:  Congruent  Thought Process:  Goal Directed and Descriptions of Associations: Intact  Orientation:  Full (Time, Place, and Person)  Thought Content: Logical   Suicidal Thoughts:  No  Homicidal Thoughts:  No  Memory:  Immediate;   Fair Recent;   Fair Remote;   Poor  Judgement:  Fair  Insight:  Fair  Psychomotor Activity:  Normal  Concentration:  Concentration: Fair and Attention Span: Fair  Recall:  [employer] of Knowledge: Fair  Language: Fair  Akathisia:  No  Handed:  Right  AIMS (if indicated): done  Assets:  Communication Skills Desire for Improvement Housing Social Support Talents/Skills  ADL's:  Intact  Cognition: WNL  Sleep:  Fair   Screenings: AIMS    Flowsheet Row Video Visit from 08/01/2022 in [facility] Office Visit from 06/20/2022 in [facility] Office Visit from 05/23/2022 in [facility] Office Visit from 10/23/2018 in [facility]  AIMS Total Score 0 0 0 3      AUDIT    Flowsheet Row Video Visit from 08/01/2022 in [facility]  Alcohol Use Disorder Identification Test Final Score (AUDIT) 15      GAD-7    Flowsheet Row Office Visit from 06/20/2022 in [facility] Office Visit from 05/26/2022 in [facility] Office Visit from 05/23/2022 in [facility] Video Visit from 02/22/2022 in [facility] Office Visit from 01/25/2022 in [facility]  Total GAD-7 Score 6 4 12 6 7       PHQ2-9    Flowsheet Row Video Visit from 08/01/2022 in [facility] Office Visit from 06/20/2022  in [facility] Office Visit from 05/26/2022 in [facility] Office Visit from 05/23/2022 in [facility] Video Visit from 02/22/2022 in [facility]  PHQ-2 Total Score 0 0 0 1 0  PHQ-9 Total Score -- 1 1 2 1       Flowsheet Row Video Visit from 08/01/2022 in [facility] Office Visit from 06/20/2022 in [facility] Office Visit from 05/23/2022 in [facility]  C-SSRS RISK CATEGORY No Risk No Risk No Risk        Assessment and Plan: Rodney [name] is a 36 year old Caucasian male, [place] in [place], has a history of schizophrenia, alcoholism, cannabis abuse in remission, anxiety disorder, tobacco use disorder was evaluated by telemedicine today.  Patient is currently improving with regards to his mood although he continues to struggle with binging on alcohol.  Discussed plan as noted below.  Plan Schizophrenia-stable Risperidone 2 mg p.o. nightly Cogentin 1 mg p.o. twice daily as needed Depakote ER 1000 mg p.o. daily Depakote level-05/24/2022-61-therapeutic.  GAD-improving BuSpar 10 mg p.o. daily after lunch Gabapentin 600 mg p.o. 3 times daily Patient was provided resources for therapist-has not established care with a therapist yet.  Memory loss-unstable Patient was referred for neurological consultation. Patient had labs as well as MRI brain-awaiting follow-up.   Alcohol use disorder mild-unstable Patient with binging episodes on weekends, currently denies any blackouts or problems related to alcohol.  However does report memory problems.  Discussed with patient at length about the effect of alcohol being a CNS depressant on his mood symptoms, memory. Provided brief psychotherapy. Discussed referral for CDIOP, counseling programs-patient declines. Patient is interested in starting AA meetings-provided information for AA  meetings in the community. Discussed starting a medication for alcoholism-like naltrexone.  He is already on gabapentin which also should help.  Patient is currently not interested. Patient agreeable to routing today's notes to his neurologist.  Will coordinate care.  Tobacco use disorder-unstable Will monitor closely.   Follow-up in clinic in 6 to 8 weeks or sooner if needed.  Collaboration of Care: Collaboration of Care: Other encouraged to follow-up with neurologist.  Patient/Guardian was advised Release of Information must be obtained prior to any record release in order to collaborate their care with an outside provider. Patient/Guardian was advised if they have not already done so to contact the registration department to sign all necessary forms in order for 05/26/2022 to release information regarding their care.   Consent: Patient/Guardian gives verbal consent for treatment and assignment of benefits for services provided during this visit. Patient/Guardian expressed understanding and agreed to proceed.   This note was generated in part or whole with voice recognition software. Voice recognition is usually quite accurate but there are transcription errors that can and very often do occur. I apologize for any typographical errors that were not detected and corrected.      [place], MD 08/02/2022, 1:39 PM

## 2022-08-01 NOTE — Patient Instructions (Signed)
Alcohol Use Disorder Alcohol use disorder is a condition in which drinking disrupts daily life. People with this condition drink too much alcohol and cannot control their drinking. Alcohol use disorder can cause serious problems with physical health. It can affect the brain, heart, and other internal organs. This disorder can raise the risk for certain cancers and cause problems with mental health, such as depression or anxiety. What are the causes? This condition is caused by drinking too much alcohol over time. Some people with this condition drink to cope with or escape from negative life events. Others drink to relieve symptoms of physical pain or symptoms of mental illness. What increases the risk? You are more likely to develop this condition if: You have a family history of alcohol use disorder. Your culture encourages drinking to the point of becoming drunk (intoxication). You had a mood or conduct disorder in childhood. You have been abused. You are an adolescent and you: Have poor performance in school. Have poor supervision or guidance. Act on impulse and like taking risks. What are the signs or symptoms? Symptoms of this condition include: Drinking more than you want to. Trying several times without success to drink less. Spending a lot of time thinking about alcohol, getting alcohol, drinking alcohol, or recovering from drinking alcohol. Continuing to drink even when it is causing serious problems in your daily life. Drinking when it is dangerous to drink, such as before driving a car. Needing more and more alcohol to get the same effect you want (building up tolerance). Having symptoms of withdrawal when you stop drinking. Withdrawal symptoms may include: Trouble sleeping, leading to tiredness (fatigue). Mood swings of depression and anxiety. Physical symptoms, such as a fast heart rate, rapid breathing, high blood pressure (hypertension), fever, cold sweats, or  nausea. Seizures. Severe confusion. Feeling or seeing things that are not there (hallucinations). Shaking movements that you cannot control (tremors). How is this diagnosed? This condition is diagnosed with an assessment. Your health care provider may start by asking three or four questions about your drinking, or they may give you a simple test to take. This helps to get clear information from you. You may also have a physical exam or lab tests. You may be referred to a substance abuse counselor. How is this treated? With education, some people with alcohol use disorder are able to reduce their drinking. Many with this disorder cannot change their drinking behavior on their own and need help with treatment from substance use specialists. Treatments may include: Detoxification. Detoxification involves quitting drinking with supervision and direction of health care providers. Your health care provider may prescribe medicines within the first week to help lessen withdrawal symptoms. Alcohol withdrawal can be dangerous and life-threatening. Detoxification may be provided in a home, community, or primary care setting, or in a hospital or substance use treatment facility. Counseling. This may involve motivational interviewing (MI), family therapy, or cognitive behavioral therapy (CBT). A counselor can address the things you can do to change your drinking behavior and how to maintain the changes. Talk therapy aims to: Identify your positive motivations to change. Identify and avoid the things that trigger your drinking. Help you learn how to plan your behavior change. Develop support systems that can help you sustain the change. Medicines. Medicines can help treat this disorder by: Decreasing cravings. Decreasing the positive feeling you have when you drink. Causing an uncomfortable physical reaction when you drink (aversion therapy). Support groups such as Alcoholics Anonymous (AA). These groups are    led by people who have quit drinking. The groups provide emotional support, advice, and guidance. Some people with this condition benefit from a combination of treatments provided by specialized substance use treatment centers. Follow these instructions at home:  Medicines Take over-the-counter and prescription medicines only as told by your health care provider. Ask before starting any new medicines, herbs, or supplements. General instructions Ask friends and family members to support your choice to stay sober. Avoid places where alcohol is served. Create a plan to deal with tempting situations. Attend support groups regularly. Practice hobbies or activities you enjoy. Do not drink and drive. How is this prevented? Do not drink alcohol if your health care provider tells you not to drink. If you drink alcohol: Limit how much you have to: 0-1 drink a day for women who are not pregnant. 0-2 drinks a day for men. Know how much alcohol is in your drink. In the [place] one drink equals one 12 oz bottle of beer (355 mL), one 5 oz glass of wine (148 mL), or one 1 oz glass of hard liquor (44 mL). If you have a mental health condition, seek treatment. Develop a healthy lifestyle through: Meditation or deep breathing. Exercise. Spending time in nature. Listening to music. Talking with a trusted friend or family member. If you are a teen: Do not drink alcohol. Avoid gatherings where you might be tempted to drink alcohol. Do not be afraid to say no if someone offers you alcohol. Speak up about why you do not want to drink. Set a positive example for others around you by not drinking. Build relationships with friends who do not drink. Where to find more information Substance Abuse and Mental Health Services Administration: samhsa.gov Alcoholics Anonymous: aa.org Contact a health care provider if: You cannot take your medicines as told. Your symptoms get worse or you experience symptoms of  withdrawal when you stop drinking. You start drinking again (relapse) and your symptoms get worse. Get help right away if: You have thoughts about hurting yourself or others. Get help right away if you feel like you may hurt yourself or others, or have thoughts about taking your own life. Go to your nearest emergency room or: Call 911. Call the National Suicide Prevention Lifeline at 1-800-273-8255 or 988. This is open 24 hours a day. Text the Crisis Text Line at 741741. Summary Alcohol use disorder is a condition in which drinking disrupts daily life. People with this condition drink too much alcohol and cannot control their drinking. Treatment may include detoxification, counseling, medicines, and support groups. Ask friends and family members to support you. Avoid situations where alcohol is served. Get help right away if you have thoughts about hurting yourself or others. This information is not intended to replace advice given to you by your health care provider. Make sure you discuss any questions you have with your health care provider. Document Revised: 02/15/2022 Document Reviewed: 02/15/2022 Elsevier Patient Education  2023 Elsevier Inc.  

## 2022-08-10 ENCOUNTER — Other Ambulatory Visit: Payer: Self-pay | Admitting: Family Medicine

## 2022-08-10 DIAGNOSIS — F1020 Alcohol dependence, uncomplicated: Secondary | ICD-10-CM

## 2022-08-10 DIAGNOSIS — F411 Generalized anxiety disorder: Secondary | ICD-10-CM

## 2022-08-10 NOTE — Telephone Encounter (Signed)
Requested Prescriptions  Pending Prescriptions Disp Refills  . gabapentin (NEURONTIN) 600 MG tablet [Pharmacy Med Name: GABAPENTIN 600 MG TABLET] 270 tablet 1    Sig: TAKE 1 TABLET BY MOUTH THREE TIMES A DAY     Neurology: Anticonvulsants - gabapentin Passed - 08/10/2022  6:42 AM      Passed - Cr in normal range and within 360 days    Creat  Date Value Ref Range Status  07/21/2020 0.81 0.60 - 1.35 mg/dL Final   Creatinine, Ser  Date Value Ref Range Status  05/23/2022 0.66 0.61 - 1.24 mg/dL Final         Passed - Completed PHQ-2 or PHQ-9 in the last 360 days      Passed - Valid encounter within last 12 months    Recent Outpatient Visits          2 months ago Pleurisy   [facility] Smitty Cords, DO   5 months ago Paranoid schizophrenia [facility])   [facility] Smitty Cords, DO   6 months ago Acute viral syndrome   [facility] Smitty Cords, DO   9 months ago Influenza A   [facility] Smitty Cords, DO   1 year ago LUQ abdominal pain   [facility] [place], Netta Neat, DO      Future Appointments            In 1 month [facility], [facility]

## 2022-08-18 IMAGING — DX DG CHEST 2V
3 series · 3 of 3 positions shown · non-contrast
Comparison: No prior.

CLINICAL DATA: Shortness of breath.

EXAM:
CHEST - 2 VIEW

[chest pa (1 of 2)]
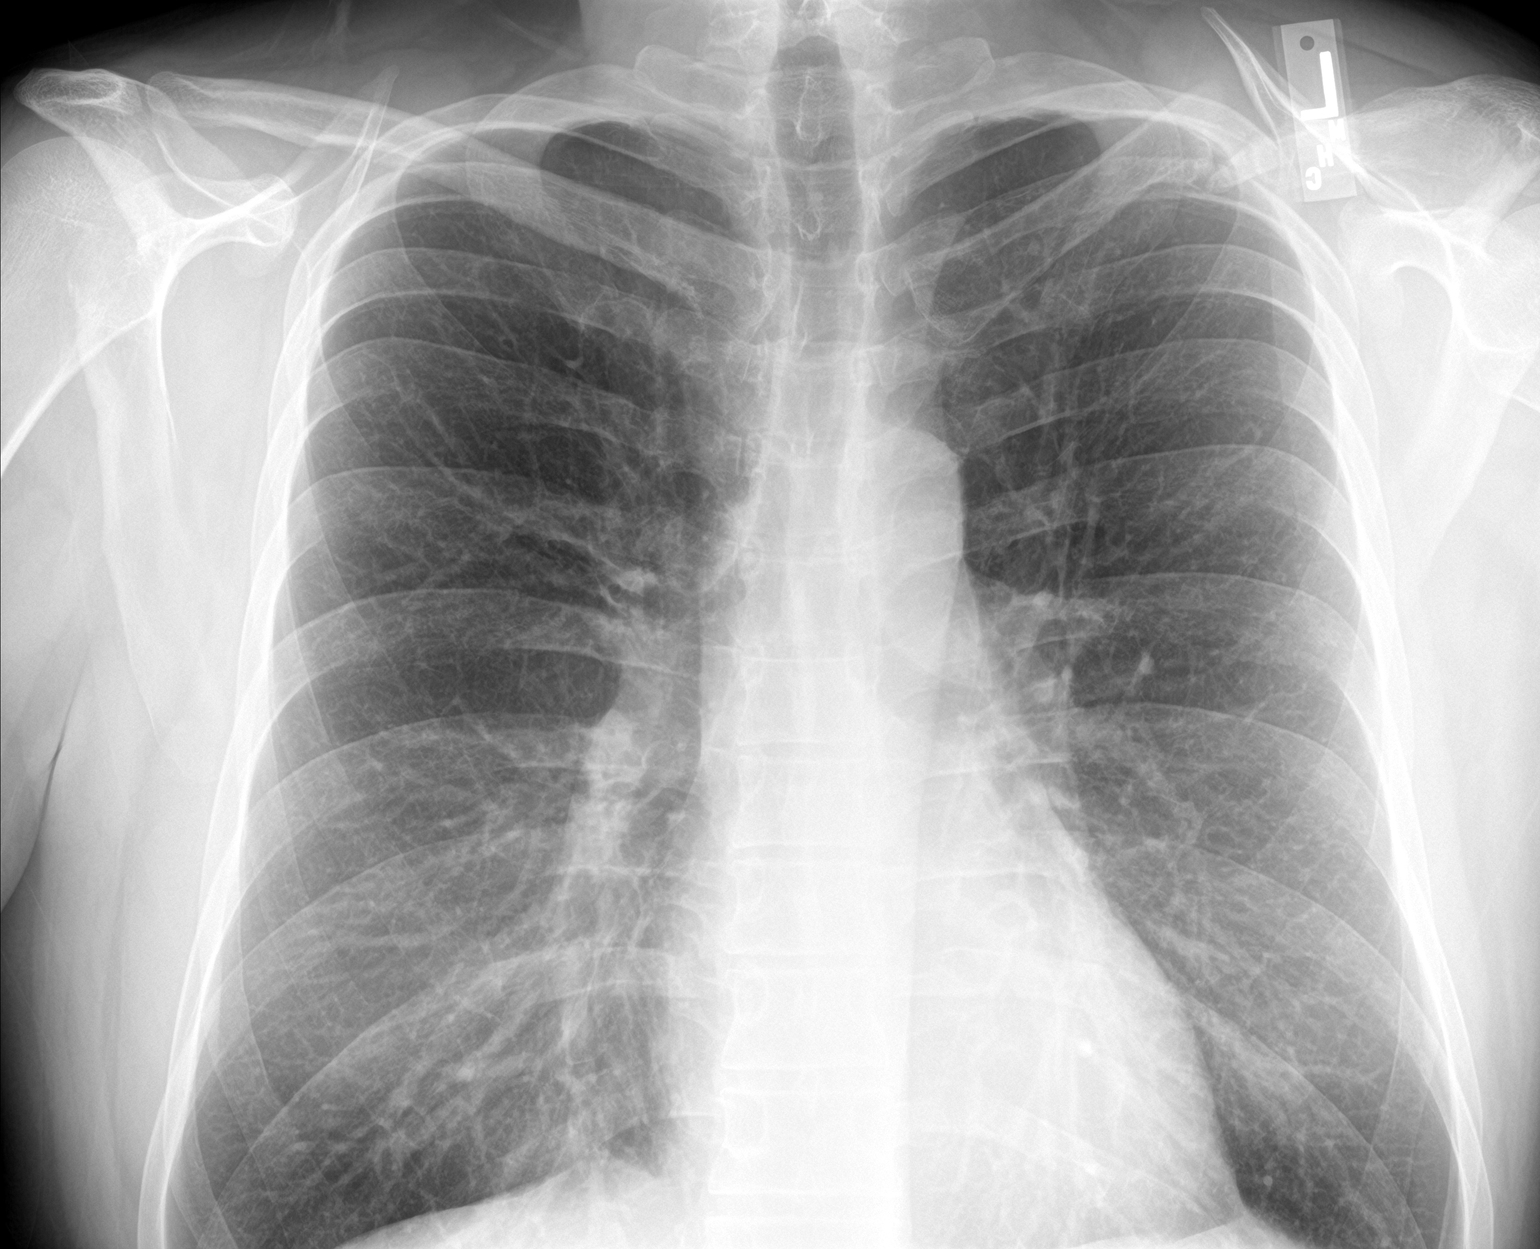

[chest lat]
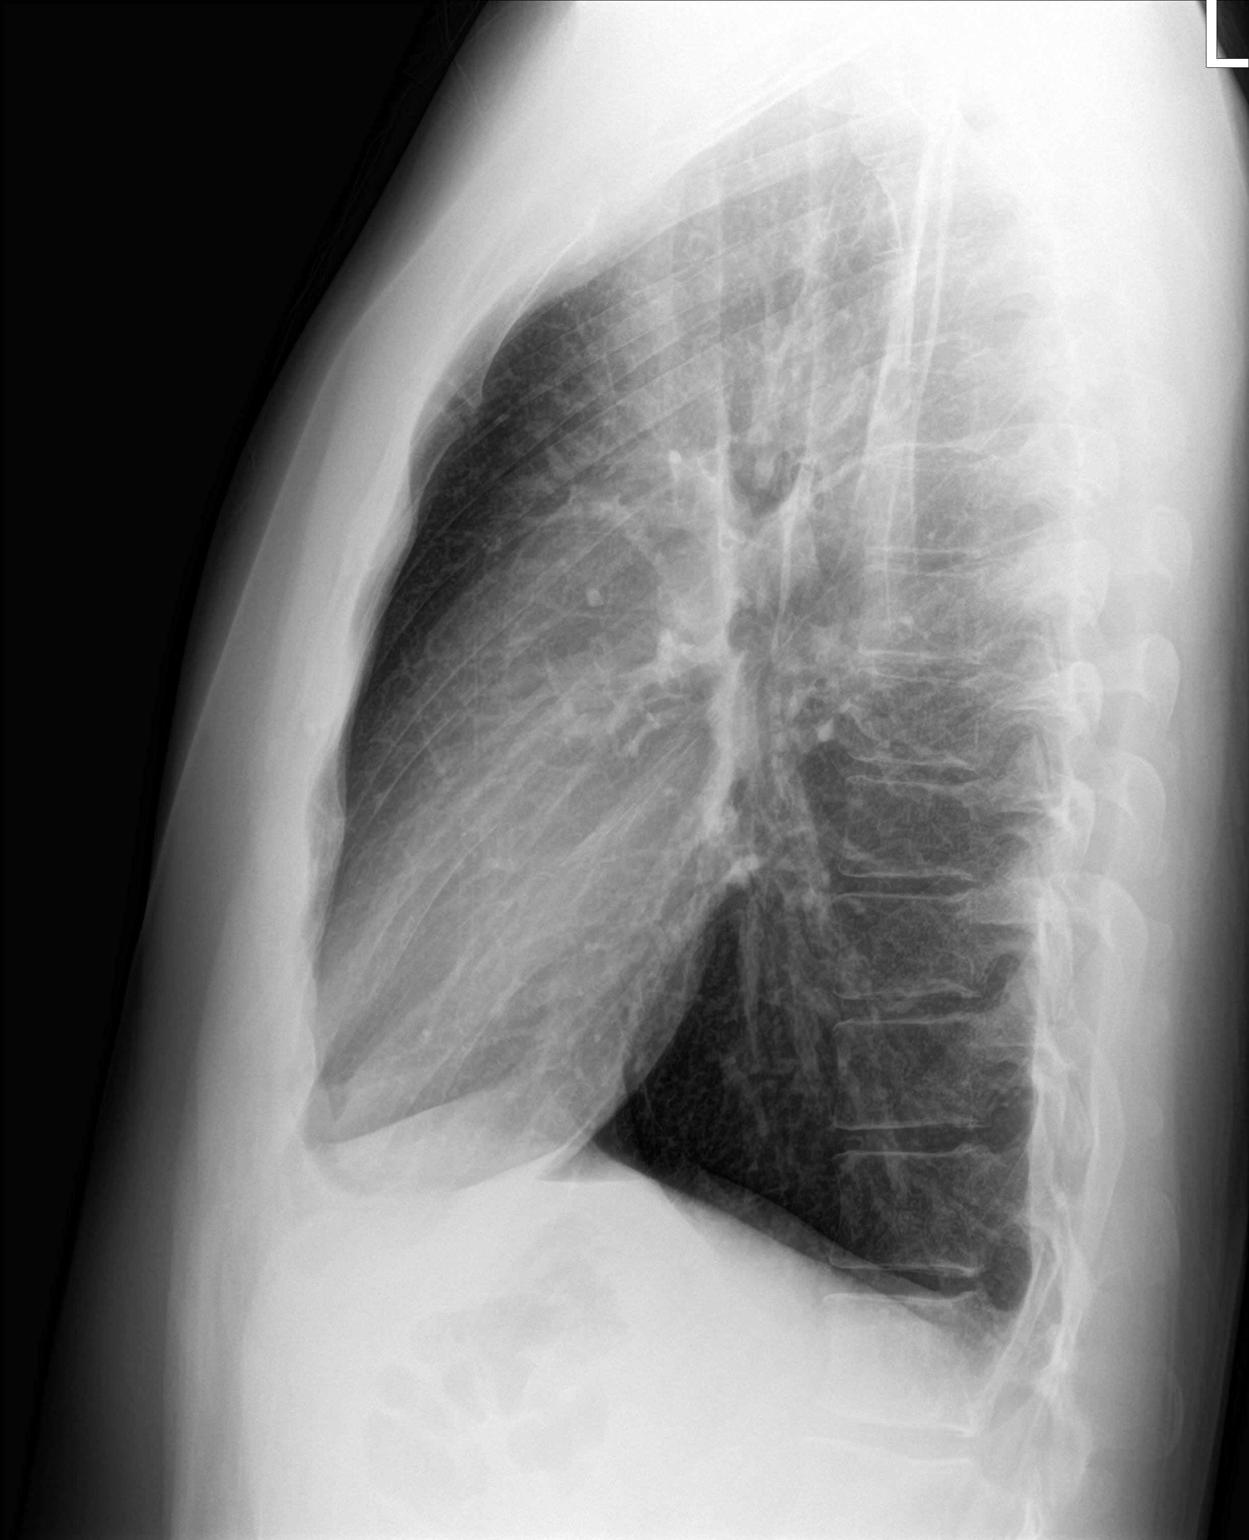

[chest pa (2 of 2)]
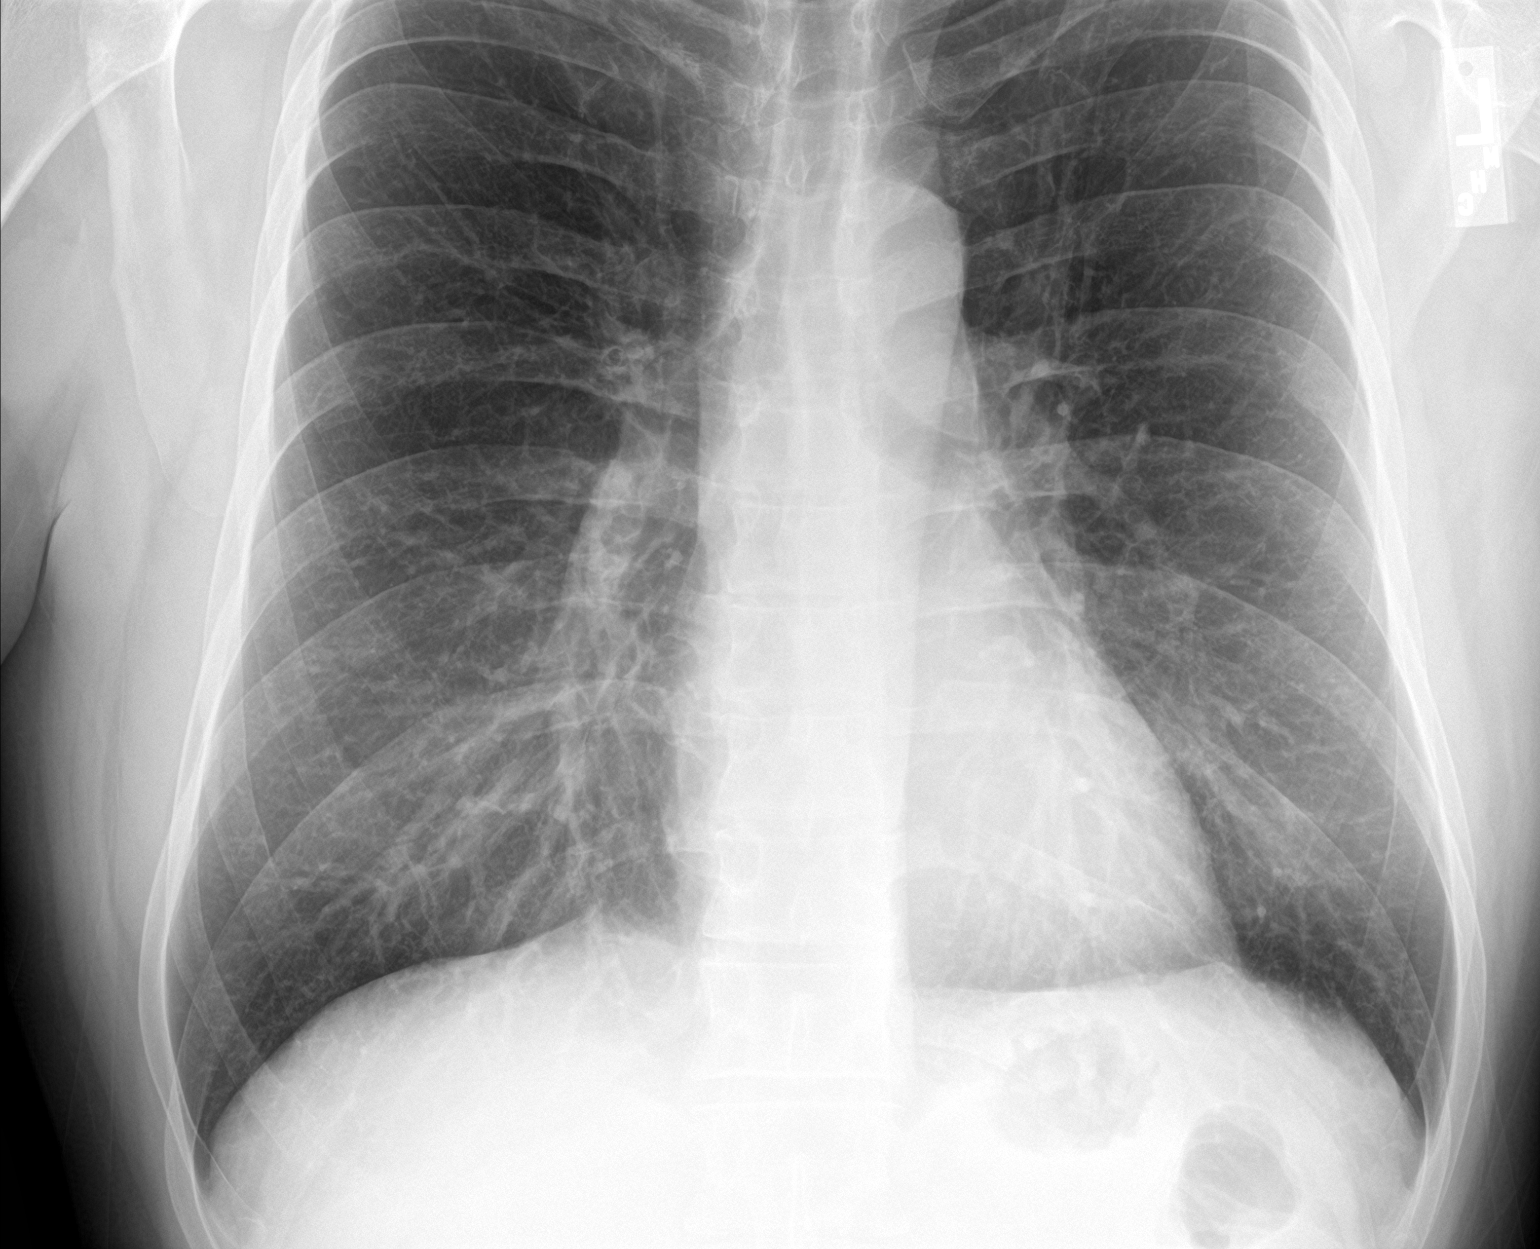

[3 of 3 positions shown; findings below may reference images not displayed]

FINDINGS: Mediastinum hilar structures normal. Lungs are clear. Heart size
normal. Displaced fracture of the distal left clavicle noted. This
may be old.
IMPRESSION: 1. No acute cardiopulmonary disease.
2. Displaced fracture of the distal left clavicle. This may be old.

## 2022-08-21 ENCOUNTER — Ambulatory Visit: Payer: Self-pay

## 2022-08-21 ENCOUNTER — Telehealth: Payer: Medicare Other | Admitting: Nurse Practitioner

## 2022-08-21 DIAGNOSIS — U071 COVID-19: Secondary | ICD-10-CM

## 2022-08-21 NOTE — Progress Notes (Signed)
Virtual Visit Consent   Rodney [name], you are scheduled for a virtual visit with a [facility] provider today. Just as with appointments in the office, your consent must be obtained to participate. Your consent will be active for this visit and any virtual visit you may have with one of our providers in the next 365 days. If you have a MyChart account, a copy of this consent can be sent to you electronically.  As this is a virtual visit, video technology does not allow for your provider to perform a traditional examination. This may limit your provider's ability to fully assess your condition. If your provider identifies any concerns that need to be evaluated in person or the need to arrange testing (such as labs, EKG, etc.), we will make arrangements to do so. Although advances in technology are sophisticated, we cannot ensure that it will always work on either your end or our end. If the connection with a video visit is poor, the visit may have to be switched to a telephone visit. With either a video or telephone visit, we are not always able to ensure that we have a secure connection.  By engaging in this virtual visit, you consent to the provision of healthcare and authorize for your insurance to be billed (if applicable) for the services provided during this visit. Depending on your insurance coverage, you may receive a charge related to this service.  I need to obtain your verbal consent now. Are you willing to proceed with your visit today? Rodney [name] has provided verbal consent on 08/21/2022 for a virtual visit (video or telephone). Viviano Simas, FNP  Date: 08/21/2022 10:27 AM  Virtual Visit via Video Note   I, Viviano Simas, connected with  Rodney [name]  ([id number], 1986/05/29) on 08/21/22 at 10:30 AM EDT by a video-enabled telemedicine application and verified that I am speaking with the correct person using two identifiers.  Location: Patient: Virtual Visit Location Patient: Home Provider:  Virtual Visit Location Provider: Home Office   I discussed the limitations of evaluation and management by telemedicine and the availability of in person appointments. The patient expressed understanding and agreed to proceed.    History of Present Illness: Rodney [name] is a 36 y.o. who identifies as a male who was assigned male at birth, and is being seen today with complaints of sore throat, body aches and a fever for the past 24 hours. Today he is experiencing nausea without vomiting, feels loss of taste.   Today the body aches are the worst symptoms.   Sick co-worker without known COVID exposure.   Has had COVID in the past about one year ago without complications  He has not been vaccinated for COVID   Has a history of asthma, daily inhaler and as needed  smoking history as well   Was able to take a home test during visit and home test was positive for COVID   Problems:  Patient Active Problem List   Diagnosis Date Noted   Memory loss 05/23/2022   High risk medication use 05/23/2022   Bladder wall thickening 08/06/2020   GAD (generalized anxiety disorder) 09/02/2019   Cannabis use disorder, mild, in sustained remission 09/02/2019   Alcohol use disorder, moderate, dependence (HCC) 02/18/2019   Asthma with COPD (HCC) 09/26/2018   Paranoid schizophrenia (HCC) 02/05/2018   Vitamin D deficiency 08/09/2015   Reactive airway disease with status asthmaticus 08/09/2015   Cigarette nicotine dependence with nicotine-induced disorder 09/14/2014  Alcohol use disorder, mild, abuse 02/08/2011   Tobacco use disorder 02/08/2011   Allergic rhinitis 02/08/2011   SCHIZOPHRENIA, HX OF 02/08/2011    Allergies:  Allergies  Allergen Reactions   Hydroxyzine Hcl Rash   Medications:  Current Outpatient Medications:    albuterol (PROAIR HFA) 108 (90 Base) MCG/ACT inhaler, Inhale 2 puffs into the lungs every 6 (six) hours as needed for wheezing or shortness of breath., Disp: 8.5 each, Rfl: 2    benztropine (COGENTIN) 1 MG tablet, Take 1 tablet (1 mg total) by mouth 2 (two) times daily. For side effects of risperidone, Disp: 180 tablet, Rfl: 0   busPIRone (BUSPAR) 10 MG tablet, Take 1 tablet (10 mg total) by mouth daily after lunch., Disp: 90 tablet, Rfl: 0   divalproex (DEPAKOTE ER) 500 MG 24 hr tablet, Take 2 tablets (1,000 mg total) by mouth at bedtime., Disp: 180 tablet, Rfl: 0   Fluticasone-Umeclidin-Vilant (TRELEGY ELLIPTA) 200-62.5-25 MCG/INH AEPB, Inhale 200 mcg into the lungs daily., Disp: 28 each, Rfl: 11   gabapentin (NEURONTIN) 600 MG tablet, TAKE 1 TABLET BY MOUTH THREE TIMES A DAY, Disp: 270 tablet, Rfl: 1   ipratropium (ATROVENT) 0.03 % nasal spray, Place 2 sprays into both nostrils every 12 (twelve) hours. (Patient not taking: Reported on 06/20/2022), Disp: 30 mL, Rfl: 0   risperiDONE (RISPERDAL) 2 MG tablet, TAKE 1 TABLET BY MOUTH EVERYDAY AT BEDTIME, Disp: 90 tablet, Rfl: 1   terbinafine (LAMISIL) 1 % cream, Apply topically., Disp: , Rfl:    triamcinolone (NASACORT) 55 MCG/ACT AERO nasal inhaler, PLACE 1 SPRAY INTO THE NOSE 2 (TWO) TIMES DAILY., Disp: 16.9 each, Rfl: 12  Observations/Objective: Patient is well-developed, well-nourished in no acute distress.  Resting comfortably  at home.  Head is normocephalic, atraumatic.  No labored breathing.  Speech is clear and coherent with logical content.  Patient is alert and oriented at baseline.    Assessment and Plan: 1. COVID-19 Isolation precautions discussed  Advise over the counter medications for symptom relief Stay on inhalers as directed  Follow up if symptoms worsen as discussed       Follow Up Instructions: I discussed the assessment and treatment plan with the patient. The patient was provided an opportunity to ask questions and all were answered. The patient agreed with the plan and demonstrated an understanding of the instructions.  A copy of instructions were sent to the patient via MyChart unless  otherwise noted below.    The patient was advised to call back or seek an in-person evaluation if the symptoms worsen or if the condition fails to improve as anticipated.  Time:  I spent 15 minutes with the patient via telehealth technology discussing the above problems/concerns.    Viviano Simas, FNP

## 2022-08-21 NOTE — Telephone Encounter (Signed)
Pt stated Itchy throat yesterday, chills fever (99.00) body aches, lack of appetite.  Pt is okay to see any other provider if possible    Chief Complaint: Sore throat,body aches, chills, low grade fever Symptoms: Above Frequency: Yesterday Pertinent Negatives: Patient denies  Disposition: [] ED /[] Urgent Care (no appt availability in office) / [] Appointment(In office/virtual)/ [x]  [facility]/ [] Home Care/ [] Refused Recommended Disposition /[] [facility]/ []  Follow-up with PCP Additional Notes:   Reason for Disposition  Fever present > 3 days (72 hours)  Answer Assessment - Initial Assessment Questions 1. ONSET: "When did the throat start hurting?" (Hours or days ago)      Yesterday 2. SEVERITY: "How bad is the sore throat?" (Scale 1-10; mild, moderate or severe)   - MILD (1-3):  Doesn't interfere with eating or normal activities.   - MODERATE (4-7): Interferes with eating some solids and normal activities.   - SEVERE (8-10):  Excruciating pain, interferes with most normal activities.   - SEVERE WITH DYSPHAGIA (10): Can't swallow liquids, drooling.     Mild 3. STREP EXPOSURE: "Has there been any exposure to strep within the past week?" If Yes, ask: "What type of contact occurred?"      No 4.  VIRAL SYMPTOMS: "Are there any symptoms of a cold, such as a runny nose, cough, hoarse voice or red eyes?"      Body aches, low grade fever,chills 5. FEVER: "Do you have a fever?" If Yes, ask: "What is your temperature, how was it measured, and when did it start?"     Low grade 6. PUS ON THE TONSILS: "Is there pus on the tonsils in the back of your throat?"     No 7. OTHER SYMPTOMS: "Do you have any other symptoms?" (e.g., difficulty breathing, headache, rash)     No 8. PREGNANCY: "Is there any chance you are pregnant?" "When was your last menstrual period?"     N/a  Protocols used: Sore Throat-A-AH

## 2022-09-05 ENCOUNTER — Encounter: Payer: Self-pay | Admitting: Pulmonary Disease

## 2022-09-05 ENCOUNTER — Ambulatory Visit (INDEPENDENT_AMBULATORY_CARE_PROVIDER_SITE_OTHER): Payer: Medicare Other | Admitting: Pulmonary Disease

## 2022-09-05 VITALS — BP 110/70 | HR 66 | Temp 97.8°F | Ht 77.0 in | Wt 220.8 lb

## 2022-09-05 DIAGNOSIS — F17210 Nicotine dependence, cigarettes, uncomplicated: Secondary | ICD-10-CM

## 2022-09-05 DIAGNOSIS — J3089 Other allergic rhinitis: Secondary | ICD-10-CM | POA: Diagnosis not present

## 2022-09-05 DIAGNOSIS — J449 Chronic obstructive pulmonary disease, unspecified: Secondary | ICD-10-CM

## 2022-09-05 DIAGNOSIS — J302 Other seasonal allergic rhinitis: Secondary | ICD-10-CM

## 2022-09-05 MED ORDER — TRELEGY ELLIPTA 200-62.5-25 MCG/ACT IN AEPB: 1.0000 | INHALATION_SPRAY | Freq: Every day | RESPIRATORY_TRACT | 0 refills | Status: DC

## 2022-09-05 NOTE — Progress Notes (Signed)
Subjective:    Patient ID: Rodney [name], male    DOB: 02-28-1986, 36 y.o.   MRN: [id number] Patient Care Team: Smitty Cords, DO as PCP - [facility])  Chief Complaint  Patient presents with   Follow-up   HPI Rodney [name] is a 36 year old current smoker (2 PPD) who presents for follow-up on the issue of asthma/COPD overlap.  Patient continues to be compliant with Trelegy Ellipta 200.  He had COVID-19 2 weeks ago but had mild symptoms.  Did not have an exacerbation of his asthma/COPD.  He has had some issues with nasal congestion.  Still compliant with Nasacort.  We did review the appropriate dosage which would be 1 spray twice a day to each nostril.  He does not endorse any fevers, chills or sweats.  No change in cough or sputum production.  No hemoptysis.  Patient did have a visit to the ED on 30 May with right-sided chest discomfort.  His chest x-ray at that time was negative.  It appears that it was chest wall pain.  No issues with any exacerbations since his last visit here in May 2023.  No other issues.  Review of Systems A 10 point review of systems was performed and it is as noted above otherwise negative.  Patient Active Problem List   Diagnosis Date Noted   Memory loss 05/23/2022   High risk medication use 05/23/2022   Bladder wall thickening 08/06/2020   GAD (generalized anxiety disorder) 09/02/2019   Cannabis use disorder, mild, in sustained remission 09/02/2019   Alcohol use disorder, moderate, dependence (HCC) 02/18/2019   Asthma with COPD (HCC) 09/26/2018   Paranoid schizophrenia (HCC) 02/05/2018   Vitamin D deficiency 08/09/2015   Reactive airway disease with status asthmaticus 08/09/2015   Cigarette nicotine dependence with nicotine-induced disorder 09/14/2014   Alcohol use disorder, mild, abuse 02/08/2011   Tobacco use disorder 02/08/2011   Allergic rhinitis 02/08/2011   SCHIZOPHRENIA, HX OF 02/08/2011   Social History   Tobacco Use   Smoking  status: Every Day    Packs/day: 1.50    Years: 20.00    Total pack years: 30.00    Types: Cigarettes   Smokeless tobacco: Never   Tobacco comments:    1.5-2 ppd, not trying to quit.  09/05/22 hfb  Substance Use Topics   Alcohol use: Yes    Alcohol/week: 35.0 standard drinks of alcohol    Types: 35 Cans of beer per week    Comment: 6 drinks a day   Allergies  Allergen Reactions   Hydroxyzine Hcl Rash   Current Meds  Medication Sig   albuterol (PROAIR HFA) 108 (90 Base) MCG/ACT inhaler Inhale 2 puffs into the lungs every 6 (six) hours as needed for wheezing or shortness of breath.   benztropine (COGENTIN) 1 MG tablet Take 1 tablet (1 mg total) by mouth 2 (two) times daily. For side effects of risperidone   busPIRone (BUSPAR) 10 MG tablet Take 1 tablet (10 mg total) by mouth daily after lunch.   divalproex (DEPAKOTE ER) 500 MG 24 hr tablet Take 2 tablets (1,000 mg total) by mouth at bedtime.   Fluticasone-Umeclidin-Vilant (TRELEGY ELLIPTA) 200-62.5-25 MCG/INH AEPB Inhale 200 mcg into the lungs daily.   gabapentin (NEURONTIN) 600 MG tablet TAKE 1 TABLET BY MOUTH THREE TIMES A DAY   ipratropium (ATROVENT) 0.03 % nasal spray Place 2 sprays into both nostrils every 12 (twelve) hours.   risperiDONE (RISPERDAL) 2 MG tablet TAKE 1 TABLET BY MOUTH  EVERYDAY AT BEDTIME   terbinafine (LAMISIL) 1 % cream Apply topically.   triamcinolone (NASACORT) 55 MCG/ACT AERO nasal inhaler PLACE 1 SPRAY INTO THE NOSE 2 (TWO) TIMES DAILY.   Immunization History  Administered Date(s) Administered   Pneumococcal Polysaccharide-23 08/09/2015   Tdap 07/19/2020      Objective:   Physical Exam BP 110/70 (BP Location: Left Arm, Patient Position: Sitting, Cuff Size: Large)   Pulse 66   Temp 97.8 F (36.6 C) (Oral)   Ht 6\' 5"  (1.956 m)   Wt 220 lb 12.8 oz (100.2 kg)   SpO2 98%   BMI 26.18 kg/m  GENERAL: Well-developed, well-nourished, no acute distress.  Fully ambulatory.  Speech is fluent. HEAD:  Normocephalic, atraumatic.  EYES: Pupils equal, round, reactive to light.  No scleral icterus.  MOUTH: Dentition is intact though some teeth in poor repair.  Oral mucosa moist.  No thrush. NECK: Supple. No thyromegaly. Trachea midline. No JVD.  No adenopathy. PULMONARY: Good air entry bilaterally.  Coarse breath sounds otherwise, no adventitious sounds. CARDIOVASCULAR: S1 and S2. Regular rate and rhythm.  No rubs, murmurs or gallops heard. ABDOMEN: Benign. MUSCULOSKELETAL: No joint deformity, no clubbing, no edema.  NEUROLOGIC: No focal deficit, no gait disturbance, speech is fluent. SKIN: Intact,warm,dry.  On limited exam, no rashes PSYCH: Flat affect, normal behavior.  Chest x-ray performed 23 May 2022, dependently reviewed, showing hyperinflation consistent with COPD, no other acute process:     Assessment & Plan:     ICD-10-CM   1. Asthma with COPD (HCC)  J44.9 Pulmonary Function Test [facility] Only   Continue Trelegy 200 Continue as needed albuterol Reassess with PFTs STOP smoking    2. Perennial allergic rhinitis with seasonal variation  J30.89    J30.2    Continue Nasacort 1 spray twice a day, seasonally    3. Tobacco dependence due to cigarettes  F17.210    Counseled regards to discontinuation of smoking Counseling time 3 to 5 minutes     Orders Placed This Encounter  Procedures   Pulmonary Function Test [facility] Only    Standing Status:   Future    Standing Expiration Date:   09/06/2023    Order Specific Question:   Full PFT: includes the following: basic spirometry, spirometry pre & post bronchodilator, diffusion capacity (DLCO), lung volumes    Answer:   Full PFT   We will see the patient in follow-up in 3 to 4 months time.  We will reassess his lung function and PFTs.  He was counseled regards to discontinuation of smoking.  11/06/2023, MD Advanced Bronchoscopy [facility]    *This note was dictated using voice recognition  software/Dragon.  Despite best efforts to proofread, errors can occur which can change the meaning. Any transcriptional errors that result from this process are unintentional and may not be fully corrected at the time of dictation.

## 2022-09-05 NOTE — Addendum Note (Signed)
Addended by: Delrae Rend on: 09/05/2022 10:15 AM   Modules accepted: Orders

## 2022-09-05 NOTE — Patient Instructions (Signed)
Continue your Trelegy as you are doing.  Continue Nasacort for your nasal symptoms.  We did discuss that the appropriate dose would be 1 spray twice a day to each nostril.  We will get breathing tests to check on your lungs as you have not had these since 2020.  Breathing test will be done anytime before your follow-up appointment.  We will see you in follow-up in 3 to 4 months time call sooner should any new problems arise.

## 2022-09-12 ENCOUNTER — Other Ambulatory Visit: Payer: Self-pay | Admitting: Psychiatry

## 2022-09-12 DIAGNOSIS — F411 Generalized anxiety disorder: Secondary | ICD-10-CM

## 2022-09-16 ENCOUNTER — Other Ambulatory Visit: Payer: Self-pay | Admitting: Psychiatry

## 2022-09-16 DIAGNOSIS — F200 Paranoid schizophrenia: Secondary | ICD-10-CM

## 2022-09-26 ENCOUNTER — Ambulatory Visit: Payer: Medicare Other

## 2022-09-29 ENCOUNTER — Ambulatory Visit: Payer: Medicare Other

## 2022-10-05 ENCOUNTER — Telehealth (INDEPENDENT_AMBULATORY_CARE_PROVIDER_SITE_OTHER): Payer: Medicare Other | Admitting: Psychiatry

## 2022-10-05 ENCOUNTER — Encounter: Payer: Self-pay | Admitting: Psychiatry

## 2022-10-05 DIAGNOSIS — R413 Other amnesia: Secondary | ICD-10-CM

## 2022-10-05 DIAGNOSIS — F411 Generalized anxiety disorder: Secondary | ICD-10-CM

## 2022-10-05 DIAGNOSIS — F1010 Alcohol abuse, uncomplicated: Secondary | ICD-10-CM

## 2022-10-05 DIAGNOSIS — F17200 Nicotine dependence, unspecified, uncomplicated: Secondary | ICD-10-CM

## 2022-10-05 DIAGNOSIS — F200 Paranoid schizophrenia: Secondary | ICD-10-CM

## 2022-10-05 NOTE — Progress Notes (Signed)
Virtual Visit via Video Note  I connected with Rodney [name] on 10/05/22 at  4:40 PM EDT by a video enabled telemedicine application and verified that I am speaking with the correct person using two identifiers.  Location Provider Location : [facility] Patient Location : Car  Participants: Patient , Provider    I discussed the limitations of evaluation and management by telemedicine and the availability of in person appointments. The patient expressed understanding and agreed to proceed.   I discussed the assessment and treatment plan with the patient. The patient was provided an opportunity to ask questions and all were answered. The patient agreed with the plan and demonstrated an understanding of the instructions.   The patient was advised to call back or seek an in-person evaluation if the symptoms worsen or if the condition fails to improve as anticipated.  BH MD OP Progress Note  10/06/2022 9:16 AM DOMANI BAKOS  MRN:  [id number]  Chief Complaint:  Chief Complaint  Patient presents with   Follow-up   Anxiety   Depression   HPI: Rodney [name] is a 36 year old Caucasian male, employed, lives in [place], married, has a history of schizophrenia, GAD, alcohol abuse, cannabis abuse in remission, tobacco use disorder was evaluated by telemedicine today.  Patient today reports overall he has been doing fairly well with regards to his mood.  He had COVID-19 infection however he recovered.  Patient reports sleep as good.  Reports appetite is fair.  Denies any hallucinations, did not seem to be preoccupied with any delusions.  Continues to have memory problems although he feels better than before.  He does have upcoming appointment with neurology.  Had MRI scan of his brain completed.  Patient reports he has been cutting back on alcohol intake.  Patient reports he is compliant on all his medications except the BuSpar which he stopped since he felt sluggish on it.  He continues to use  Cogentin as prescribed, tried to cut back due to concerns about memory problems.  He however did not tolerate the lower dosage and had to go back on the Cogentin 1 mg twice a day.  Patient denies any other concerns today.  Visit Diagnosis:    ICD-10-CM   1. Paranoid schizophrenia (HCC)  F20.0     2. GAD (generalized anxiety disorder)  F41.1     3. Alcohol use disorder, mild, abuse  F10.10     4. Tobacco use disorder  F17.200     5. Memory loss  R41.3       Past Psychiatric History: Reviewed past psychiatric history from progress note on 09/23/2018.  Past Medical History:  Past Medical History:  Diagnosis Date   Anxiety    Bipolar disorder (HCC)    CIGARETTE SMOKER 02/08/2011   Qualifier: Diagnosis of  By: Laural Benes MD, [place]     Seizures [facility])     Past Surgical History:  Procedure Laterality Date   WISDOM TOOTH EXTRACTION      Family Psychiatric History: Reviewed family psychiatric history from progress note on 09/23/2018.  Family History:  Family History  Problem Relation Age of Onset   Anxiety disorder Mother    OCD Brother    Anxiety disorder Maternal Aunt    Anxiety disorder Maternal Grandmother    Anxiety disorder Paternal Grandfather     Social History: Reviewed social history from progress note on 09/23/2018. Social History   Socioeconomic History   Marital status: Significant Other    Spouse name: Not  on file   Number of children: 1   Years of education: Not on file   Highest education level: Associate degree: occupational, Hotel manager, or vocational program  Occupational History   Occupation: Glass blower/designer     Comment: [place]   Tobacco Use   Smoking status: Every Day    Packs/day: 1.50    Years: 20.00    Total pack years: 30.00    Types: Cigarettes   Smokeless tobacco: Never   Tobacco comments:    1.5-2 ppd, not trying to quit.  09/05/22 hfb  Vaping Use   Vaping Use: Former  Substance and Sexual Activity   Alcohol use: Yes     Alcohol/week: 35.0 standard drinks of alcohol    Types: 35 Cans of beer per week    Comment: 6 drinks a day   Drug use: Not Currently    Types: Marijuana, Other-see comments, LSD    Comment: in past   Sexual activity: Yes  Other Topics Concern   Not on file  Social History Narrative   Not on file   Social Determinants of Health   Financial Resource Strain: Low Risk  (09/20/2021)   Overall Financial Resource Strain (CARDIA)    Difficulty of Paying Living Expenses: Not hard at all  Food Insecurity: No Food Insecurity (09/20/2021)   Hunger Vital Sign    Worried About Running Out of Food in the Last Year: Never true    Ran Out of Food in the Last Year: Never true  Transportation Needs: No Transportation Needs (09/20/2021)   PRAPARE - Hydrologist (Medical): No    Lack of Transportation (Non-Medical): No  Physical Activity: Inactive (09/20/2021)   Exercise Vital Sign    Days of Exercise per Week: 0 days    Minutes of Exercise per Session: 0 min  Stress: No Stress Concern Present (09/20/2021)   [place]    Feeling of Stress : Not at all  Social Connections: Moderately Isolated (09/23/2018)   Social Connection and Isolation Panel [NHANES]    Frequency of Communication with Friends and Family: Once a week    Frequency of Social Gatherings with Friends and Family: Never    Attends Religious Services: Never    Marine scientist or Organizations: No    Attends Archivist Meetings: Never    Marital Status: Living with partner    Allergies:  Allergies  Allergen Reactions   Hydroxyzine Hcl Rash    Metabolic Disorder Labs: Lab Results  Component Value Date   HGBA1C 5.3 03/04/2020   MPG 105 03/04/2020   MPG 100 02/18/2019   No results found for: "PROLACTIN" Lab Results  Component Value Date   CHOL 173 03/04/2020   TRIG 49 03/04/2020   HDL 75 03/04/2020   CHOLHDL 2.3  03/04/2020   LDLCALC 84 03/04/2020   LDLCALC 83 02/18/2019   Lab Results  Component Value Date   TSH 3.543 05/24/2022   TSH 1.40 02/18/2019    Therapeutic Level Labs: No results found for: "LITHIUM" Lab Results  Component Value Date   VALPROATE 61 05/24/2022   VALPROATE 84.0 03/04/2020   No results found for: "CBMZ"  Current Medications: Current Outpatient Medications  Medication Sig Dispense Refill   albuterol (PROAIR HFA) 108 (90 Base) MCG/ACT inhaler Inhale 2 puffs into the lungs every 6 (six) hours as needed for wheezing or shortness of breath. 8.5 each 2   benztropine (COGENTIN)  1 MG tablet TAKE 1 TABLET (1 MG TOTAL) BY MOUTH 2 (TWO) TIMES DAILY. FOR SIDE EFFECTS OF RISPERIDONE 180 tablet 0   divalproex (DEPAKOTE ER) 500 MG 24 hr tablet TAKE 2 TABLETS (1,000 MG TOTAL) BY MOUTH AT BEDTIME. 180 tablet 0   Fluticasone-Umeclidin-Vilant (TRELEGY ELLIPTA) 200-62.5-25 MCG/ACT AEPB Inhale 1 puff into the lungs daily at 6 (six) AM. 1 each 0   gabapentin (NEURONTIN) 600 MG tablet TAKE 1 TABLET BY MOUTH THREE TIMES A DAY 270 tablet 1   risperiDONE (RISPERDAL) 2 MG tablet TAKE 1 TABLET BY MOUTH EVERYDAY AT BEDTIME 90 tablet 1   terbinafine (LAMISIL) 1 % cream Apply topically.     triamcinolone (NASACORT) 55 MCG/ACT AERO nasal inhaler PLACE 1 SPRAY INTO THE NOSE 2 (TWO) TIMES DAILY. 16.9 each 12   Fluticasone-Umeclidin-Vilant (TRELEGY ELLIPTA) 200-62.5-25 MCG/INH AEPB Inhale 200 mcg into the lungs daily. (Patient not taking: Reported on 10/05/2022) 28 each 11   ipratropium (ATROVENT) 0.03 % nasal spray Place 2 sprays into both nostrils every 12 (twelve) hours. (Patient not taking: Reported on 10/05/2022) 30 mL 0   No current facility-administered medications for this visit.     Musculoskeletal: Strength & Muscle Tone:  UTA Gait & Station:  Seated Patient leans: N/A  Psychiatric Specialty Exam: Review of Systems  Psychiatric/Behavioral: Negative.    All other systems reviewed and  are negative.   There were no vitals taken for this visit.There is no height or weight on file to calculate BMI.  General Appearance: Casual  Eye Contact:  Fair  Speech:  Clear and Coherent  Volume:  Normal  Mood:  Euthymic  Affect:  Congruent  Thought Process:  Goal Directed and Descriptions of Associations: Intact  Orientation:  Full (Time, Place, and Person)  Thought Content: Logical   Suicidal Thoughts:  No  Homicidal Thoughts:  No  Memory:  Immediate;   Fair Recent;   Fair Remote;   Fair  Judgement:  Fair  Insight:  Fair  Psychomotor Activity:  Normal  Concentration:  Concentration: Fair and Attention Span: Fair  Recall:  Good  Fund of Knowledge: Fair  Language: Fair  Akathisia:  No  Handed:  Right  AIMS (if indicated): done  Assets:  Communication Skills Desire for Improvement Housing Resilience Social Support Talents/Skills  ADL's:  Intact  Cognition: WNL  Sleep:  Fair   Screenings: AIMS    Flowsheet Row Video Visit from 10/05/2022 in [facility] Video Visit from 08/01/2022 in [facility] Office Visit from 06/20/2022 in [facility] Office Visit from 05/23/2022 in [facility] Office Visit from 10/23/2018 in [facility]  AIMS Total Score 0 0 0 0 3      AUDIT    Flowsheet Row Video Visit from 08/01/2022 in [facility]  Alcohol Use Disorder Identification Test Final Score (AUDIT) 15      GAD-7    Flowsheet Row Video Visit from 10/05/2022 in [facility] Office Visit from 06/20/2022 in [facility] Office Visit from 05/26/2022 in [facility] Office Visit from 05/23/2022 in [facility] Video Visit from 02/22/2022 in [facility]  Total GAD-7 Score 3 6 4 12 6       PHQ2-9    Flowsheet  Row Video Visit from 10/05/2022 in [facility] Video Visit from 08/01/2022 in [facility] Office Visit from 06/20/2022 in [facility] Office Visit from 05/26/2022 in [facility] Office Visit from 05/23/2022 in [facility]  PHQ-2 Total Score 0 0 0 0 1  PHQ-9 Total Score -- -- 1 1 2       Flowsheet Row Video Visit from 10/05/2022 in [facility] Video Visit from 08/01/2022 in [facility] Office Visit from 06/20/2022 in [facility]  C-SSRS RISK CATEGORY No Risk No Risk No Risk        Assessment and Plan: PETR BONTEMPO is a 36 year old Caucasian male, [place] in [place], has a history of schizophrenia, alcoholism, cannabis abuse in remission, anxiety disorder, tobacco use disorder was evaluated by telemedicine today.  Patient is currently improving.  Plan as noted below.  Plan Schizophrenia-stable Risperidone 2 mg p.o. nightly Cogentin 1 mg p.o. twice daily as needed Depakote ER 1000 mg p.o. daily Depakote level 05/24/2022-61-therapeutic  GAD-stable Discontinue BuSpar for side effects. Gabapentin 600 mg p.o. 3 times daily Patient was referred for CBT-has been noncompliant  Memory loss-improving Patient has upcoming appointment with neurology.  Alcohol use disorder mild-improving Provided counseling again.  Tobacco use disorder-unstable Provided counseling for 1 minute.  Follow-up in clinic in 3 to 4 months or sooner in person.   Collaboration of Care: Collaboration of Care: Other encouraged to follow up with neurology.  Patient/Guardian was advised Release of Information must be obtained prior to any record release in order to collaborate their care with an outside provider. Patient/Guardian was advised if they have not already done so to contact the registration department to sign  all necessary forms in order for 05/26/2022 to release information regarding their care.   Consent: Patient/Guardian gives verbal consent for treatment and assignment of benefits for services provided during this visit. Patient/Guardian expressed understanding and agreed to proceed.   This note was generated in part or whole with voice recognition software. Voice recognition is usually quite accurate but there are transcription errors that can and very often do occur. I apologize for any typographical errors that were not detected and corrected.      [place], MD 10/06/2022, 9:16 AM

## 2022-10-06 ENCOUNTER — Encounter: Payer: Self-pay | Admitting: Psychiatry

## 2022-10-16 ENCOUNTER — Ambulatory Visit (INDEPENDENT_AMBULATORY_CARE_PROVIDER_SITE_OTHER): Payer: Medicare Other

## 2022-10-16 DIAGNOSIS — Z0000 Encounter for general adult medical examination without abnormal findings: Secondary | ICD-10-CM

## 2022-10-16 NOTE — Progress Notes (Signed)
I connected with  Rodney [name] on 10/16/22 by a audio enabled telemedicine application and verified that I am speaking with the correct person using two identifiers.  Patient Location: Home  Provider Location: Home Office  I discussed the limitations of evaluation and management by telemedicine. The patient expressed understanding and agreed to proceed.   Subjective:   Rodney [name] is a 36 y.o. male who presents for Medicare Annual/Subsequent preventive examination.  Review of Systems    Per HPI unless specifically indicated below.  Cardiac Risk Factors include: advanced age (>36men, >48 women);male gender          Objective:    There were no vitals filed for this visit. There is no height or weight on file to calculate BMI.     10/16/2022    3:56 PM 05/23/2022   10:56 AM 09/20/2021    3:22 PM 02/18/2019    8:46 AM 01/17/2019    8:38 AM  Advanced Directives  Does Patient Have a Medical Advance Directive? No No No No   Would patient like information on creating a medical advance directive? No - Patient declined No - Patient declined  No - Patient declined      Information is confidential and restricted. Go to Review Flowsheets to unlock data.    Current Medications (verified) Outpatient Encounter Medications as of 10/16/2022  Medication Sig   albuterol (PROAIR HFA) 108 (90 Base) MCG/ACT inhaler Inhale 2 puffs into the lungs every 6 (six) hours as needed for wheezing or shortness of breath.   benztropine (COGENTIN) 1 MG tablet TAKE 1 TABLET (1 MG TOTAL) BY MOUTH 2 (TWO) TIMES DAILY. FOR SIDE EFFECTS OF RISPERIDONE   divalproex (DEPAKOTE ER) 500 MG 24 hr tablet TAKE 2 TABLETS (1,000 MG TOTAL) BY MOUTH AT BEDTIME.   Fluticasone-Umeclidin-Vilant (TRELEGY ELLIPTA) 200-62.5-25 MCG/ACT AEPB Inhale 1 puff into the lungs daily at 6 (six) AM.   gabapentin (NEURONTIN) 600 MG tablet TAKE 1 TABLET BY MOUTH THREE TIMES A DAY   risperiDONE (RISPERDAL) 2 MG tablet TAKE 1 TABLET BY MOUTH  EVERYDAY AT BEDTIME   triamcinolone (NASACORT) 55 MCG/ACT AERO nasal inhaler PLACE 1 SPRAY INTO THE NOSE 2 (TWO) TIMES DAILY.   Fluticasone-Umeclidin-Vilant (TRELEGY ELLIPTA) 200-62.5-25 MCG/INH AEPB Inhale 200 mcg into the lungs daily. (Patient not taking: Reported on 10/16/2022)   terbinafine (LAMISIL) 1 % cream Apply topically. (Patient not taking: Reported on 10/16/2022)   [DISCONTINUED] ipratropium (ATROVENT) 0.03 % nasal spray Place 2 sprays into both nostrils every 12 (twelve) hours. (Patient not taking: Reported on 10/16/2022)   No facility-administered encounter medications on file as of 10/16/2022.    Allergies (verified) Hydroxyzine hcl   History: Past Medical History:  Diagnosis Date   Anxiety    Bipolar disorder (HCC)    CIGARETTE SMOKER 02/08/2011   Qualifier: Diagnosis of  By: Laural Benes MD, [place]     Seizures [facility])    Past Surgical History:  Procedure Laterality Date   WISDOM TOOTH EXTRACTION     Family History  Problem Relation Age of Onset   Anxiety disorder Mother    OCD Brother    Anxiety disorder Maternal Aunt    Anxiety disorder Maternal Grandmother    Anxiety disorder Paternal Grandfather    Social History   Socioeconomic History   Marital status: Significant Other    Spouse name: Not on file   Number of children: 1   Years of education: Not on file   Highest education level: Associate  degree: occupational, Scientist, product/process development, or vocational program  Occupational History   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    Occupation: Shipping and receiving  Tobacco Use   Smoking status: Every Day    Packs/day: 1.50    Years: 20.00    Total pack years: 30.00    Types: Cigarettes   Smokeless tobacco: Never   Tobacco comments:    1.5-2 ppd, not trying to quit.  09/05/22 hfb  Vaping Use   Vaping Use: Former  Substance and Sexual Activity   Alcohol use: Yes    Alcohol/week: 12.0 standard drinks of alcohol    Types: 12 Cans of beer per week     Comment: weekly   Drug use: Not Currently    Types: Marijuana, Other-see comments, LSD    Comment: in past   Sexual activity: Yes  Other Topics Concern   Not on file  Social History Narrative   Not on file   Social Determinants of Health   Financial Resource Strain: Medium Risk (10/16/2022)   Overall Financial Resource Strain (CARDIA)    Difficulty of Paying Living Expenses: Somewhat hard  Food Insecurity: No Food Insecurity (10/16/2022)   Hunger Vital Sign    Worried About Running Out of Food in the Last Year: Never true    Ran Out of Food in the Last Year: Never true  Transportation Needs: No Transportation Needs (10/16/2022)   PRAPARE - Administrator, Civil Service (Medical): No    Lack of Transportation (Non-Medical): No  Physical Activity: Inactive (10/16/2022)   Exercise Vital Sign    Days of Exercise per Week: 0 days    Minutes of Exercise per Session: 0 min  Stress: No Stress Concern Present (10/16/2022)   [employer] of Occupational Health - Occupational Stress Questionnaire    Feeling of Stress : Not at all  Social Connections: Moderately Isolated (10/16/2022)   Social Connection and Isolation Panel [NHANES]    Frequency of Communication with Friends and Family: More than three times a week    Frequency of Social Gatherings with Friends and Family: More than three times a week    Attends Religious Services: Never    Database administrator or Organizations: No    Attends Banker Meetings: Never    Marital Status: Living with partner    Tobacco Counseling Ready to quit: No Counseling given: Yes Tobacco comments: 1.5-2 ppd, not trying to quit.  09/05/22 hfb   Clinical Intake:  Pre-visit preparation completed: No  Pain : No/denies pain     Nutritional Status: BMI of 19-24  Normal Nutritional Risks: None Diabetes: No     Diabetic?No   Interpreter Needed?: No  Information entered by :: Laurel Dimmer,  CMA   Activities of Daily Living    10/16/2022    3:44 PM  In your present state of health, do you have any difficulty performing the following activities:  Hearing? 0  Vision? 1  Difficulty concentrating or making decisions? 0  Walking or climbing stairs? 0  Dressing or bathing? 0  Doing errands, shopping? 0    Patient Care Team: Smitty Cords, DO as PCP - General (Family Medicine)  Indicate any recent Medical Services you may have received from other than [facility] providers in the past year (date may be approximate).    The pt was seen at [facility] on 05/23/2022 for chest pain, SOB.  Assessment:   This is a routine wellness examination for  Rodney [name].  Hearing/Vision screen Denies any hearing issues. Annually eye exam, [facility]. Wear glasses.  Dietary issues and exercise activities discussed: Current Exercise Habits: The patient does not participate in regular exercise at present, Exercise limited by: None identified   Goals Addressed             This Visit's Progress    Stay Active and Independent           Why is this important?   Regular activity or exercise is important to managing back pain.  Activity helps to keep your muscles strong.  You will sleep better and feel more relaxed.  You will have more energy and feel less stressed.  If you are not active now, start slowly. Little changes make a big difference.  Rest, but not too much.  Stay as active as you can and listen to your body's signals.     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       Depression Screen    10/16/2022    3:43 PM 10/05/2022    4:49 PM 08/01/2022    4:41 PM 06/20/2022    4:47 PM 05/26/2022   10:46 AM 05/23/2022    4:59 PM 02/22/2022   11:38 AM  PHQ 2/9 Scores  PHQ - 2 Score 0    0  0  PHQ- 9 Score 0    1  1     Information is confidential and restricted. Go to Review Flowsheets to unlock data.    Fall Risk    05/26/2022   10:46 AM  02/22/2022   11:38 AM 01/25/2022    2:18 PM 09/20/2021    3:22 PM 03/05/2020    8:19 AM  Fall Risk   Falls in the past year? 0 0 0 0 0  Number falls in past yr: 0 0 0  0  Injury with Fall? 0 0 0  0  Risk for fall due to : No Fall Risks No Fall Risks No Fall Risks Medication side effect   Follow up Falls evaluation completed Falls evaluation completed Falls evaluation completed Falls evaluation completed;Education provided;Falls prevention discussed Falls evaluation completed    FALL RISK PREVENTION PERTAINING TO THE HOME:  Any stairs in or around the home? Yes  If so, are there any without handrails? Yes  Home free of loose throw rugs in walkways, pet beds, electrical cords, etc? Yes  Adequate lighting in your home to reduce risk of falls? Yes   ASSISTIVE DEVICES UTILIZED TO PREVENT FALLS:  Life alert? No  Use of a cane, walker or w/c? No  Grab bars in the bathroom? No  Shower chair or bench in shower? No  Elevated toilet seat or a handicapped toilet? No   TIMED UP AND GO:  Was the test performed? No .    Cognitive Function:        10/16/2022    3:45 PM 09/20/2021    3:24 PM 02/18/2019    8:46 AM  6CIT Screen  What Year? 0 points 0 points 0 points  What month? 0 points 0 points 0 points  What time? 0 points 0 points 0 points  Count back from 20 0 points 0 points 0 points  Months in reverse 0 points 0 points 0 points  Repeat phrase 0 points 4 points 0 points  Total Score 0 points 4 points 0 points    Immunizations Immunization History  Administered  Date(s) Administered   Pneumococcal Polysaccharide-23 08/09/2015   Tdap 07/19/2020    TDAP status: Up to date  Flu Vaccine status: Declined, Education has been provided regarding the importance of this vaccine but patient still declined. Advised may receive this vaccine at local pharmacy or Health Dept. Aware to provide a copy of the vaccination record if obtained from local pharmacy or Health Dept. Verbalized acceptance  and understanding.  Pneumococcal vaccine status: Up to date  Covid-19 vaccine status: Information provided on how to obtain vaccines.   Qualifies for Shingles Vaccine? No Zostavax completed No   Shingrix Completed?: No.    Education has been provided regarding the importance of this vaccine. Patient has been advised to call insurance company to determine out of pocket expense if they have not yet received this vaccine. Advised may also receive vaccine at local pharmacy or Health Dept. Verbalized acceptance and understanding.  Screening Tests Health Maintenance  Topic Date Due   COVID-19 Vaccine (1) Never done   Hepatitis C Screening  Never done   INFLUENZA VACCINE  Never done   TETANUS/TDAP  07/19/2030   HIV Screening  Completed   HPV VACCINES  Aged Out    Health Maintenance  Health Maintenance Due  Topic Date Due   COVID-19 Vaccine (1) Never done   Hepatitis C Screening  Never done   INFLUENZA VACCINE  Never done      Lung Cancer Screening: (Low Dose CT Chest recommended if Age 35-80 years, 30 pack-year currently smoking OR have quit w/in 15years.) does qualify. Referral: No   Additional Screening:  Hepatitis C Screening: does qualify; Overdue   Vision Screening: Recommended annual ophthalmology exams for early detection of glaucoma and other disorders of the eye. Is the patient up to date with their annual eye exam?  Yes  Who is the provider or what is the name of the office in which the patient attends annual eye exams? [facility]  If pt is not established with a provider, would they like to be referred to a provider to establish care? No .   Dental Screening: Recommended annual dental exams for proper oral hygiene  Community Resource Referral / Chronic Care Management: CRR required this visit?  No   CCM required this visit?  No      Plan:     I have personally reviewed and noted the following in the patient's chart:   Medical and social  history Use of alcohol, tobacco or illicit drugs  Current medications and supplements including opioid prescriptions. Patient is not currently taking opioid prescriptions. Functional ability and status Nutritional status Physical activity Advanced directives List of other physicians Hospitalizations, surgeries, and ER visits in previous 12 months Vitals Screenings to include cognitive, depression, and falls Referrals and appointments  In addition, I have reviewed and discussed with patient certain preventive protocols, quality metrics, and best practice recommendations. A written personalized care plan for preventive services as well as general preventive health recommendations were provided to patient.    Rodney [name] , Thank you for taking time to come for your Medicare Wellness Visit. I appreciate your ongoing commitment to your health goals. Please review the following plan we discussed and let me know if I can assist you in the future.   These are the goals we discussed:  Goals      Patient Stated     09/20/2021, wants to cut back on drinking and then smoking     Quit Smoking  Smoking cessation discussed- not ready to quit at this time      Stay Active and Independent         Why is this important?   Regular activity or exercise is important to managing back pain.  Activity helps to keep your muscles strong.  You will sleep better and feel more relaxed.  You will have more energy and feel less stressed.  If you are not active now, start slowly. Little changes make a big difference.  Rest, but not too much.  Stay as active as you can and listen to your body's signals.     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        This is a list of the screening recommended for you and due dates:  Health Maintenance  Topic Date Due   COVID-19 Vaccine (1) Never done   Hepatitis C Screening: USPSTF Recommendation to screen - Ages  20-79 yo.  Never done   Flu Shot  Never done   Tetanus Vaccine  07/19/2030   HIV Screening  Completed   HPV Vaccine  Aged [address] [place], [place]   10/16/2022   Nurse Notes: Approximately 30 minute Non-Face-To-Face Visit

## 2022-10-29 ENCOUNTER — Other Ambulatory Visit: Payer: Self-pay | Admitting: Pulmonary Disease

## 2022-12-04 ENCOUNTER — Other Ambulatory Visit: Payer: Self-pay | Admitting: Pulmonary Disease

## 2022-12-04 MED ORDER — TRELEGY ELLIPTA 200-62.5-25 MCG/ACT IN AEPB: 1.0000 | INHALATION_SPRAY | Freq: Every day | RESPIRATORY_TRACT | 2 refills | Status: DC

## 2022-12-06 ENCOUNTER — Ambulatory Visit: Payer: Medicare Other | Admitting: Pulmonary Disease

## 2022-12-11 ENCOUNTER — Other Ambulatory Visit: Payer: Self-pay | Admitting: Psychiatry

## 2022-12-11 DIAGNOSIS — F411 Generalized anxiety disorder: Secondary | ICD-10-CM

## 2022-12-12 ENCOUNTER — Other Ambulatory Visit: Payer: Self-pay | Admitting: Psychiatry

## 2022-12-12 DIAGNOSIS — F200 Paranoid schizophrenia: Secondary | ICD-10-CM

## 2022-12-16 ENCOUNTER — Other Ambulatory Visit: Payer: Self-pay | Admitting: Family Medicine

## 2022-12-16 DIAGNOSIS — F200 Paranoid schizophrenia: Secondary | ICD-10-CM

## 2022-12-20 NOTE — Telephone Encounter (Signed)
Requested medication (s) are due for refill today:   Provider to review and decide    Requested medication (s) are on the active medication list:   Yes  Future visit scheduled:   This is from another provider   Last ordered: 09/18/2022 #180, 0 refills  Returned because this was prescribed by Dr. Jomarie Longs but sent to Dr. Althea Charon for some reason.   Requested Prescriptions  Pending Prescriptions Disp Refills   benztropine (COGENTIN) 1 MG tablet [Pharmacy Med Name: BENZTROPINE MES 1 MG TABLET] 180 tablet 0    Sig: TAKE 1 TABLET (1 MG TOTAL) BY MOUTH 2 (TWO) TIMES DAILY. FOR SIDE EFFECTS OF RISPERIDONE     There is no refill protocol information for this order

## 2022-12-22 ENCOUNTER — Other Ambulatory Visit: Payer: Self-pay | Admitting: Family Medicine

## 2022-12-22 DIAGNOSIS — F411 Generalized anxiety disorder: Secondary | ICD-10-CM

## 2022-12-23 NOTE — Telephone Encounter (Signed)
Requested medication (s) are due for refill today:Yes  Requested medication (s) are on the active medication list: Yes  Last refill:  06/19/22  Future visit scheduled: No  Notes to clinic:  Unable to refill per protocol, cannot delegate.      Requested Prescriptions  Pending Prescriptions Disp Refills   risperiDONE (RISPERDAL) 2 MG tablet [Pharmacy Med Name: RISPERIDONE 2 MG TABLET] 90 tablet 1    Sig: TAKE 1 TABLET BY MOUTH EVERYDAY AT BEDTIME     Not Delegated - Psychiatry:  Antipsychotics - Second Generation (Atypical) - risperidone Failed - 12/23/2022  9:56 AM      Failed - This refill cannot be delegated      Failed - Lipid Panel in normal range within the last 12 months    Cholesterol  Date Value Ref Range Status  03/04/2020 173 <200 mg/dL Final   LDL Cholesterol (Calc)  Date Value Ref Range Status  03/04/2020 84 mg/dL (calc) Final    Comment:    Reference range: <100 . Desirable range <100 mg/dL for primary prevention;   <70 mg/dL for patients with CHD or diabetic patients  with > or = 2 CHD risk factors. Marland Kitchen LDL-C is now calculated using the Martin-Hopkins  calculation, which is a validated novel method providing  better accuracy than the Friedewald equation in the  estimation of LDL-C.  [name] et al. [name]. 4268;341(96): 2061-2068  (http://education.[facility].com/faq/FAQ164)    HDL  Date Value Ref Range Status  03/04/2020 75 > OR = 40 mg/dL Final   Triglycerides  Date Value Ref Range Status  03/04/2020 49 <150 mg/dL Final         Failed - CBC within normal limits and completed in the last 12 months    WBC  Date Value Ref Range Status  05/23/2022 7.7 4.0 - 10.5 K/uL Final   RBC  Date Value Ref Range Status  05/23/2022 4.96 4.22 - 5.81 MIL/uL Final   Hemoglobin  Date Value Ref Range Status  05/23/2022 16.1 13.0 - 17.0 g/dL Final   HCT  Date Value Ref Range Status  05/23/2022 48.1 39.0 - 52.0 % Final   MCHC  Date Value Ref Range  Status  05/23/2022 33.5 30.0 - 36.0 g/dL Final   [facility]  Date Value Ref Range Status  05/23/2022 32.5 26.0 - 34.0 pg Final   MCV  Date Value Ref Range Status  05/23/2022 97.0 80.0 - 100.0 fL Final   No results found for: "PLTCOUNTKUC", "LABPLAT", "POCPLA" RDW  Date Value Ref Range Status  05/23/2022 12.8 11.5 - 15.5 % Final         Failed - CMP within normal limits and completed in the last 12 months    Albumin  Date Value Ref Range Status  05/24/2022 4.5 3.5 - 5.0 g/dL Final   Alkaline Phosphatase  Date Value Ref Range Status  05/24/2022 49 38 - 126 U/L Final   Alkaline phosphatase (APISO)  Date Value Ref Range Status  07/21/2020 43 36 - 130 U/L Final   ALT  Date Value Ref Range Status  05/24/2022 25 0 - 44 U/L Final   AST  Date Value Ref Range Status  05/24/2022 23 15 - 41 U/L Final   BUN  Date Value Ref Range Status  05/23/2022 13 6 - 20 mg/dL Final   Calcium  Date Value Ref Range Status  05/23/2022 9.8 8.9 - 10.3 mg/dL Final   CO2  Date Value Ref Range Status  05/23/2022 26  22 - 32 mmol/L Final   Creat  Date Value Ref Range Status  07/21/2020 0.81 0.60 - 1.35 mg/dL Final   Creatinine, Ser  Date Value Ref Range Status  05/23/2022 0.66 0.61 - 1.24 mg/dL Final   Glucose, Bld  Date Value Ref Range Status  05/23/2022 96 70 - 99 mg/dL Final    Comment:    Glucose reference range applies only to samples taken after fasting for at least 8 hours.   Potassium  Date Value Ref Range Status  05/23/2022 4.2 3.5 - 5.1 mmol/L Final   Sodium  Date Value Ref Range Status  05/24/2022 137 135 - 145 mmol/L Final    Comment:    Performed at [facility], [address] [place], [place] [address]   Total Bilirubin  Date Value Ref Range Status  05/24/2022 0.6 0.3 - 1.2 mg/dL Final   Bilirubin, Direct  Date Value Ref Range Status  05/24/2022 <0.1 0.0 - 0.2 mg/dL Final   Indirect Bilirubin  Date Value Ref Range Status  05/24/2022 NOT CALCULATED  0.3 - 0.9 mg/dL Final    Comment:    Performed at [facility], [address] [place], [place] [address]   Protein, UA  Date Value Ref Range Status  07/19/2020 Negative Negative Final   Total Protein  Date Value Ref Range Status  05/24/2022 7.9 6.5 - 8.1 g/dL Final   GFR, Est African American  Date Value Ref Range Status  07/21/2020 133 > OR = 60 mL/min/1.23m2 Final   GFR, Est Non African American  Date Value Ref Range Status  07/21/2020 115 > OR = 60 mL/min/1.70m2 Final   GFR, Estimated  Date Value Ref Range Status  05/23/2022 >60 >60 mL/min Final    Comment:    (NOTE) Calculated using the CKD-EPI Creatinine Equation (2021)          Passed - TSH in normal range and within 360 days    TSH  Date Value Ref Range Status  05/24/2022 3.543 0.350 - 4.500 uIU/mL Final    Comment:    Performed by a 3rd Generation assay with a functional sensitivity of <=0.01 uIU/mL. Performed at [facility], [address] [place], [place] [address]   02/18/2019 1.40 0.40 - 4.50 mIU/L Final         Passed - Completed PHQ-2 or PHQ-9 in the last 360 days      Passed - Last BP in normal range    BP Readings from Last 1 Encounters:  09/05/22 110/70         Passed - Last Heart Rate in normal range    Pulse Readings from Last 1 Encounters:  09/05/22 66         Passed - Valid encounter within last 6 months    Recent Outpatient Visits           7 months ago Pleurisy   [facility] Smitty Cords, DO   10 months ago Paranoid schizophrenia [facility])   [facility] Smitty Cords, DO   11 months ago Acute viral syndrome   [facility] [place], Netta Neat, DO   1 year ago Influenza A   [facility] Smitty Cords, DO   1 year ago LUQ abdominal pain   [facility] [place], Netta Neat, [place]

## 2022-12-29 ENCOUNTER — Ambulatory Visit: Payer: Medicare Other | Admitting: Pulmonary Disease

## 2023-01-11 ENCOUNTER — Ambulatory Visit: Payer: Medicare Other | Attending: Pulmonary Disease

## 2023-01-11 DIAGNOSIS — J4489 Other specified chronic obstructive pulmonary disease: Secondary | ICD-10-CM

## 2023-01-11 DIAGNOSIS — F17200 Nicotine dependence, unspecified, uncomplicated: Secondary | ICD-10-CM | POA: Insufficient documentation

## 2023-01-11 DIAGNOSIS — R0600 Dyspnea, unspecified: Secondary | ICD-10-CM | POA: Diagnosis not present

## 2023-01-11 LAB — PULMONARY FUNCTION TEST ARMC ONLY
DL/VA % pred: 81 %
DL/VA: 3.73 ml/min/mmHg/L
DLCO unc % pred: 66 %
DLCO unc: 25.77 ml/min/mmHg
FEF 25-75 Post: 3.67 L/sec
FEF 25-75 Pre: 2.25 L/sec
FEF2575-%Change-Post: 63 %
FEF2575-%Pred-Post: 77 %
FEF2575-%Pred-Pre: 47 %
FEV1-%Change-Post: 17 %
FEV1-%Pred-Post: 77 %
FEV1-%Pred-Pre: 65 %
FEV1-Post: 4.03 L
FEV1-Pre: 3.42 L
FEV1FVC-%Change-Post: 8 %
FEV1FVC-%Pred-Pre: 82 %
FEV6-%Change-Post: 8 %
FEV6-%Pred-Post: 86 %
FEV6-%Pred-Pre: 79 %
FEV6-Post: 5.58 L
FEV6-Pre: 5.13 L
FEV6FVC-%Change-Post: 0 %
FEV6FVC-%Pred-Post: 101 %
FEV6FVC-%Pred-Pre: 101 %
FVC-%Change-Post: 8 %
FVC-%Pred-Post: 85 %
FVC-%Pred-Pre: 78 %
FVC-Post: 5.63 L
FVC-Pre: 5.18 L
Post FEV1/FVC ratio: 71 %
Post FEV6/FVC ratio: 99 %
Pre FEV1/FVC ratio: 66 %
Pre FEV6/FVC Ratio: 99 %
RV % pred: 120 %
RV: 2.61 L
TLC % pred: 82 %
TLC: 6.85 L

## 2023-01-11 MED ORDER — ALBUTEROL SULFATE (2.5 MG/3ML) 0.083% IN NEBU
2.5000 mg | INHALATION_SOLUTION | Freq: Once | RESPIRATORY_TRACT | Status: AC
  Administered 2023-01-11: 2.5 mg via RESPIRATORY_TRACT
  Filled 2023-01-11: qty 3

## 2023-01-14 ENCOUNTER — Other Ambulatory Visit: Payer: Self-pay | Admitting: Psychiatry

## 2023-01-14 DIAGNOSIS — F411 Generalized anxiety disorder: Secondary | ICD-10-CM

## 2023-01-29 ENCOUNTER — Ambulatory Visit: Payer: Medicare Other | Admitting: Pulmonary Disease

## 2023-02-07 ENCOUNTER — Ambulatory Visit (INDEPENDENT_AMBULATORY_CARE_PROVIDER_SITE_OTHER): Payer: Medicare Other | Admitting: Psychiatry

## 2023-02-07 ENCOUNTER — Encounter: Payer: Self-pay | Admitting: Psychiatry

## 2023-02-07 VITALS — BP 150/87 | HR 66 | Temp 98.0°F | Ht 77.0 in | Wt 230.0 lb

## 2023-02-07 DIAGNOSIS — F17200 Nicotine dependence, unspecified, uncomplicated: Secondary | ICD-10-CM | POA: Diagnosis not present

## 2023-02-07 DIAGNOSIS — F411 Generalized anxiety disorder: Secondary | ICD-10-CM

## 2023-02-07 DIAGNOSIS — Z79899 Other long term (current) drug therapy: Secondary | ICD-10-CM

## 2023-02-07 DIAGNOSIS — F1010 Alcohol abuse, uncomplicated: Secondary | ICD-10-CM | POA: Diagnosis not present

## 2023-02-07 DIAGNOSIS — F200 Paranoid schizophrenia: Secondary | ICD-10-CM | POA: Diagnosis not present

## 2023-02-07 MED ORDER — RISPERIDONE 2 MG PO TABS: 1.0000 mg | ORAL_TABLET | Freq: Every day | ORAL | 3 refills | Status: DC

## 2023-02-07 MED ORDER — BENZTROPINE MESYLATE 1 MG PO TABS: 1.0000 mg | ORAL_TABLET | Freq: Two times a day (BID) | ORAL | 0 refills | Status: DC | PRN

## 2023-02-07 NOTE — Progress Notes (Unsigned)
[place] MD OP Progress Note  02/07/2023 5:05 PM Rodney [name]  MRN:  [id number]  Chief Complaint:  Chief Complaint  Patient presents with   Follow-up   Schizophrenia   Medication Refill   HPI: Rodney [name] is a 37 year old Caucasian male, employed, lives in [place], married, has a history of schizophrenia, GAD, alcohol abuse, cannabis abuse in remission, tobacco use disorder was evaluated in office today.  Patient today reports he is currently tired since he is coming back from a 12-hour shift at work however otherwise he has been doing fairly well with regards to his mood symptoms.  Denies any perceptual disturbances.  Reports sleep and appetite is fair.  Struggles with memory problems on and off.  Was referred to a neurologist in the past however has not followed up with them in a while.  Agreeable to schedule an appointment.  Reports he is trying to cut back on his alcohol intake, currently drinks a couple of beers a few times a week.  Denies any other illicit substance use at this time.  Patient denies any suicidality, homicidality or perceptual disturbances.  Patient is compliant on medications as prescribed.  Denies any other concerns today.  Denies  Visit Diagnosis:    ICD-10-CM   1. Paranoid schizophrenia ([place])  F20.0 benztropine (COGENTIN) 1 MG tablet    Lipid panel    Hemoglobin A1C    Valproic acid level    Hepatic function panel    Sodium    TSH    2. GAD (generalized anxiety disorder)  F41.1 risperiDONE (RISPERDAL) 2 MG tablet   in remission    3. Alcohol use disorder, mild, abuse  F10.10     4. Tobacco use disorder  F17.200     5. High risk medication use  Z79.899 Lipid panel    Hemoglobin A1C    Prolactin    Valproic acid level    Hepatic function panel    Sodium    Platelet count    TSH      Past Psychiatric History: Reviewed past psychiatric history from progress note on 09/23/2018.  Past Medical History:  Past Medical History:  Diagnosis Date    Anxiety    Bipolar disorder ([place])    CIGARETTE SMOKER 02/08/2011   Qualifier: Diagnosis of  By: Wynetta Emery MD, [place]     Seizures [facility])     Past Surgical History:  Procedure Laterality Date   WISDOM TOOTH EXTRACTION      Family Psychiatric History: Reviewed family psychiatric history from progress note on 09/23/2018.  Family History:  Family History  Problem Relation Age of Onset   Anxiety disorder Mother    OCD Brother    Anxiety disorder Maternal Aunt    Anxiety disorder Maternal Grandmother    Anxiety disorder Paternal Grandfather     Social History: Reviewed social history from progress note on 09/23/2018. Social History   Socioeconomic History   Marital status: Significant Other    Spouse name: Not on file   Number of children: 1   Years of education: Not on file   Highest education level: Associate degree: occupational, Hotel manager, or vocational program  Occupational History   Occupation: Glass blower/designer     Comment: Solicitor    Occupation: Shipping and receiving  Tobacco Use   Smoking status: Every Day    Packs/day: 1.50    Years: 20.00    Total pack years: 30.00    Types: Cigarettes   Smokeless tobacco: Never  Tobacco comments:    1.5-2 ppd, not trying to quit.  09/05/22 hfb  Vaping Use   Vaping Use: Former  Substance and Sexual Activity   Alcohol use: Yes    Alcohol/week: 12.0 standard drinks of alcohol    Types: 12 Cans of beer per week    Comment: weekly   Drug use: Not Currently    Types: Marijuana, Other-see comments, LSD    Comment: in past   Sexual activity: Yes  Other Topics Concern   Not on file  Social History Narrative   Not on file   Social Determinants of Health   Financial Resource Strain: Medium Risk (10/16/2022)   Overall Financial Resource Strain (CARDIA)    Difficulty of Paying Living Expenses: Somewhat hard  Food Insecurity: No Food Insecurity (10/16/2022)   Hunger Vital Sign    Worried About Running Out of Food in the Last  Year: Never true    Ran Out of Food in the Last Year: Never true  Transportation Needs: No Transportation Needs (10/16/2022)   PRAPARE - Hydrologist (Medical): No    Lack of Transportation (Non-Medical): No  Physical Activity: Inactive (10/16/2022)   Exercise Vital Sign    Days of Exercise per Week: 0 days    Minutes of Exercise per Session: 0 min  Stress: No Stress Concern Present (10/16/2022)   [place]    Feeling of Stress : Not at all  Social Connections: Moderately Isolated (10/16/2022)   Social Connection and Isolation Panel [NHANES]    Frequency of Communication with Friends and Family: More than three times a week    Frequency of Social Gatherings with Friends and Family: More than three times a week    Attends Religious Services: Never    Marine scientist or Organizations: No    Attends Archivist Meetings: Never    Marital Status: Living with partner    Allergies:  Allergies  Allergen Reactions   Hydroxyzine Hcl Rash    Metabolic Disorder Labs: Lab Results  Component Value Date   HGBA1C 5.3 03/04/2020   MPG 105 03/04/2020   MPG 100 02/18/2019   No results found for: "PROLACTIN" Lab Results  Component Value Date   CHOL 173 03/04/2020   TRIG 49 03/04/2020   HDL 75 03/04/2020   CHOLHDL 2.3 03/04/2020   LDLCALC 84 03/04/2020   LDLCALC 83 02/18/2019   Lab Results  Component Value Date   TSH 3.543 05/24/2022   TSH 1.40 02/18/2019    Therapeutic Level Labs: No results found for: "LITHIUM" Lab Results  Component Value Date   VALPROATE 61 05/24/2022   VALPROATE 84.0 03/04/2020   No results found for: "CBMZ"  Current Medications: Current Outpatient Medications  Medication Sig Dispense Refill   albuterol (PROAIR HFA) 108 (90 Base) MCG/ACT inhaler Inhale 2 puffs into the lungs every 6 (six) hours as needed for wheezing or shortness of breath.  8.5 each 2   divalproex (DEPAKOTE ER) 500 MG 24 hr tablet TAKE 2 TABLETS (1,000 MG TOTAL) BY MOUTH AT BEDTIME. 180 tablet 0   Fluticasone-Umeclidin-Vilant (TRELEGY ELLIPTA) 200-62.5-25 MCG/ACT AEPB Inhale 1 puff into the lungs daily. 60 each 2   gabapentin (NEURONTIN) 600 MG tablet TAKE 1 TABLET BY MOUTH THREE TIMES A DAY 270 tablet 1   terbinafine (LAMISIL) 1 % cream Apply topically.     triamcinolone (NASACORT) 55 MCG/ACT AERO nasal inhaler PLACE 1 SPRAY  INTO THE NOSE 2 (TWO) TIMES DAILY. 16.9 each 12   benztropine (COGENTIN) 1 MG tablet Take 1 tablet (1 mg total) by mouth 2 (two) times daily as needed for tremors. For side effects of risperidone 180 tablet 0   risperiDONE (RISPERDAL) 2 MG tablet Take 0.5 tablets (1 mg total) by mouth at bedtime. 90 tablet 3   No current facility-administered medications for this visit.     Musculoskeletal: Strength & Muscle Tone: within normal limits Gait & Station: normal Patient leans: N/A  Psychiatric Specialty Exam: Review of Systems  Musculoskeletal:  Positive for back pain.  Psychiatric/Behavioral: Negative.    All other systems reviewed and are negative.   Blood pressure (!) 150/87, pulse 66, temperature 98 F (36.7 C), temperature source Oral, height 6' 5"$  (1.956 m), weight 230 lb (104.3 kg).Body mass index is 27.27 kg/m.  General Appearance: Casual  Eye Contact:  Fair  Speech:  Clear and Coherent  Volume:  Normal  Mood:  Euthymic  Affect:  Restricted  Thought Process:  Goal Directed and Descriptions of Associations: Intact  Orientation:  Full (Time, Place, and Person)  Thought Content: Logical   Suicidal Thoughts:  No  Homicidal Thoughts:  No  Memory:  Immediate;   Fair Recent;   Fair Remote;   Fair  Judgement:  Fair  Insight:  Fair  Psychomotor Activity:  Normal  Concentration:  Concentration: Fair and Attention Span: Fair  Recall:  [employer] of Knowledge: Fair  Language: Fair  Akathisia:  No  Handed:  Right  AIMS (if  indicated): done  Assets:  Communication Skills Desire for Improvement Social Support Talents/Skills Transportation  ADL's:  Intact  Cognition: WNL  Sleep:  Fair   Screenings: [place] Visit from 02/07/2023 in [place] Video Visit from 10/05/2022 in [place] Video Visit from 08/01/2022 in [place] Office Visit from 06/20/2022 in [place] Office Visit from 05/23/2022 in [place] Total Score 0 0 0 0 0      AUDIT    Flowsheet Row Video Visit from 08/01/2022 in [place]  Alcohol Use Disorder Identification Test Final Score (AUDIT) 15      [place] Office Visit from 02/07/2023 in [place] Video Visit from 10/05/2022 in [place] Office Visit from 06/20/2022 in [place] Office Visit from 05/26/2022 in [facility] Office Visit from 05/23/2022 in [place]  Total GAD-7 Score 3 3 6 4 12      $ PHQ2-9    [place] Office Visit from 02/07/2023 in [place] from 10/16/2022 in [facility] Video Visit from 10/05/2022 in [place] Video Visit from 08/01/2022 in [place] Office Visit from 06/20/2022 in [facility]  PHQ-2 Total Score 0 0 0 0 0  PHQ-9 Total Score 2 0 -- -- 1      [place] Office Visit from 02/07/2023 in [place] Video Visit from  10/05/2022 in [place] Video Visit from 08/01/2022 in [place]  C-SSRS RISK CATEGORY No Risk No Risk No Risk        Assessment and Plan: Rodney [name] is a 37 year old Caucasian male, [place] in [place] has a history of schizophrenia, alcoholism, cannabis abuse in remission, anxiety disorder, tobacco use disorder was evaluated in office today.  Patient is currently stable, interested in tapering down the risperidone.  Plan as noted below.  Plan Schizophrenia-stable Patient advised to try taking risperidone half tablet of the 2 mg p.o. nightly.  If he tolerates it well he could continue that dosage. Cogentin 1 mg p.o. twice daily as needed-advised to use it only as needed and not to use it as scheduled due to side effects including memory problems. Depakote ER 1000 mg p.o. daily Depakote level on 05/24/2022-61 therapeutic.  GAD-stable Gabapentin 600 mg p.o. 3 times daily-prescribed by primary care provider.  Discussed to consider reducing the dosage of gabapentin if he continues to have memory problems, unknown if side effects from it.   Alcohol use disorder mild-improving Provided counseling, patient is currently cutting back.  Memory loss-unstable-patient advised to schedule an appointment with neurology again.  Tobacco disorder-unstable Patient to cut back.  High risk medication use-patient will benefit from following labs including Depakote level, platelet count, sodium level, LFT, prolactin, hemoglobin A1c, lipid panel, TSH.  Patient to go to [facility].  Follow-up in clinic in 4 months or sooner if needed.  This note was generated in part or whole with voice recognition software. Voice recognition is usually quite accurate but there are transcription errors that can and very often do occur. I apologize for any typographical errors that were not detected and corrected.     Ursula Alert,  MD 02/08/2023, 2:55 PM

## 2023-02-15 ENCOUNTER — Other Ambulatory Visit: Payer: Self-pay | Admitting: Family Medicine

## 2023-02-15 DIAGNOSIS — F1020 Alcohol dependence, uncomplicated: Secondary | ICD-10-CM

## 2023-02-15 DIAGNOSIS — F411 Generalized anxiety disorder: Secondary | ICD-10-CM

## 2023-02-15 NOTE — Telephone Encounter (Signed)
Patient will need an office visit for further refills. Requested Prescriptions  Pending Prescriptions Disp Refills   gabapentin (NEURONTIN) 600 MG tablet [Pharmacy Med Name: GABAPENTIN 600 MG TABLET] 270 tablet 1    Sig: TAKE 1 TABLET BY MOUTH THREE TIMES A DAY     Neurology: Anticonvulsants - gabapentin Passed - 02/15/2023 10:02 AM      Passed - Cr in normal range and within 360 days    Creat  Date Value Ref Range Status  07/21/2020 0.81 0.60 - 1.35 mg/dL Final   Creatinine, Ser  Date Value Ref Range Status  05/23/2022 0.66 0.61 - 1.24 mg/dL Final         Passed - Completed PHQ-2 or PHQ-9 in the last 360 days      Passed - Valid encounter within last 12 months    Recent Outpatient Visits           8 months ago [facility] Olin Hauser, DO   11 months ago Paranoid schizophrenia [facility])   [place], DO   1 year ago Acute viral syndrome   [place], DO   1 year ago Influenza [place], DO   1 year ago LUQ abdominal pain   [place], [place]

## 2023-03-07 ENCOUNTER — Other Ambulatory Visit: Payer: Self-pay | Admitting: Pulmonary Disease

## 2023-03-14 ENCOUNTER — Other Ambulatory Visit: Payer: Self-pay | Admitting: Psychiatry

## 2023-03-14 DIAGNOSIS — F200 Paranoid schizophrenia: Secondary | ICD-10-CM

## 2023-03-18 ENCOUNTER — Other Ambulatory Visit: Payer: Self-pay | Admitting: Psychiatry

## 2023-03-18 DIAGNOSIS — F200 Paranoid schizophrenia: Secondary | ICD-10-CM

## 2023-04-18 ENCOUNTER — Ambulatory Visit (INDEPENDENT_AMBULATORY_CARE_PROVIDER_SITE_OTHER): Payer: Medicare Other | Admitting: Nurse Practitioner

## 2023-04-18 ENCOUNTER — Encounter: Payer: Self-pay | Admitting: Nurse Practitioner

## 2023-04-18 VITALS — BP 124/74 | HR 64 | Temp 97.6°F | Ht 77.0 in | Wt 223.6 lb

## 2023-04-18 DIAGNOSIS — J3089 Other allergic rhinitis: Secondary | ICD-10-CM | POA: Diagnosis not present

## 2023-04-18 DIAGNOSIS — F17200 Nicotine dependence, unspecified, uncomplicated: Secondary | ICD-10-CM | POA: Diagnosis not present

## 2023-04-18 DIAGNOSIS — J4489 Other specified chronic obstructive pulmonary disease: Secondary | ICD-10-CM

## 2023-04-18 NOTE — Progress Notes (Signed)
  ID: Rodney [name], male    DOB: 12-Jan-1986, 37 y.o.   MRN: [id number]  Chief Complaint  Patient presents with   Follow-up    Pft results     Referring provider: Saralyn Pilar *  HPI: 37 year old male, active smoker followed for asthma with COPD.  He is a patient of Dr. Jayme Cloud and last seen in office 09/05/2022.  Past medical history significant for allergic rhinitis, schizophrenia, EtOH use disorder, anxiety.  TEST/EVENTS:  01/11/2023 PFT: FVC 78, FEV1 65, ratio 71, TLC 82, DLCOunc 66, corrects to normal range when adjusted for alveolar volume.  Positive BV (17%)  09/05/2022: OV with Dr. Jayme Cloud.  Continues to be compliant with Trelegy.  Had COVID 2 weeks ago but had mild symptoms.  No exacerbation of his asthma/COPD.  Does still have some issues with nasal congestion.  Still using Nasacort.  Scheduled for pulmonary function testing in 3 to 4 months.  Counseled to discontinue smoking.  04/18/2023: Today-follow-up Patient presents today for overdue follow-up to discuss pulmonary function testing results.  His PFTs were completed in January 2024.  Showed moderate obstruction with FEV1 65% pre and 77% post.  He had a 17% change.  Testing was overall stable compared to October 2020.  He tells me that he has been doing relatively well since he was here last.  There were a few humid days and he noticed that he had more trouble with his breathing at that time.  This has since resolved.  Does get short of breath with certain activities or certain weather changes but never has to use his rescue inhaler.  Denies any significant cough, chest congestion or wheezing.  No PND.  Using Trelegy daily.  He is still smoking around 1.5 to 2 packs/day.  Currently not trying to quit.  Allergies  Allergen Reactions   Hydroxyzine Hcl Rash    Immunization History  Administered Date(s) Administered   Pneumococcal Polysaccharide-23 08/09/2015   Tdap 07/19/2020    Past Medical History:   Diagnosis Date   Anxiety    Bipolar disorder    CIGARETTE SMOKER 02/08/2011   Qualifier: Diagnosis of  By: Laural Benes MD, [place]     Seizures     Tobacco History: Social History   Tobacco Use  Smoking Status Every Day   Packs/day: 1.50   Years: 20.00   Additional pack years: 0.00   Total pack years: 30.00   Types: Cigarettes  Smokeless Tobacco Never  Tobacco Comments   1.5-2 ppd, not trying to quit.  09/05/22 hfb   Ready to quit: Not Answered Counseling given: Not Answered Tobacco comments: 1.5-2 ppd, not trying to quit.  09/05/22 hfb   Outpatient Medications Prior to Visit  Medication Sig Dispense Refill   albuterol (PROAIR HFA) 108 (90 Base) MCG/ACT inhaler Inhale 2 puffs into the lungs every 6 (six) hours as needed for wheezing or shortness of breath. 8.5 each 2   benztropine (COGENTIN) 1 MG tablet TAKE 1 TABLET (1 MG TOTAL) BY MOUTH 2 (TWO) TIMES DAILY. FOR SIDE EFFECTS OF RISPERIDONE (Patient taking differently: Take 1 mg by mouth daily. Half a tablet) 180 tablet 0   divalproex (DEPAKOTE ER) 500 MG 24 hr tablet TAKE 2 TABLETS (1,000 MG TOTAL) BY MOUTH AT BEDTIME. 180 tablet 0   gabapentin (NEURONTIN) 600 MG tablet TAKE 1 TABLET BY MOUTH THREE TIMES A DAY 270 tablet 1   risperiDONE (RISPERDAL) 2 MG tablet Take 0.5 tablets (1 mg total) by mouth at bedtime. 90  tablet 3   terbinafine (LAMISIL) 1 % cream Apply topically.     TRELEGY ELLIPTA 200-62.5-25 MCG/ACT AEPB INHALE 1 PUFF BY MOUTH EVERY DAY 60 each 2   triamcinolone (NASACORT) 55 MCG/ACT AERO nasal inhaler PLACE 1 SPRAY INTO THE NOSE 2 (TWO) TIMES DAILY. 16.9 each 12   No facility-administered medications prior to visit.     Review of Systems:   Constitutional: No weight loss or gain, night sweats, fevers, chills, fatigue, or lassitude. HEENT: No headaches, difficulty swallowing, tooth/dental problems, or sore throat. No sneezing, itching, ear ache + nasal congestion, post nasal drip (baseline) CV:  No chest pain,  orthopnea, PND, swelling in lower extremities, anasarca, dizziness, palpitations, syncope Resp: +shortness of breath with exertion (Baseline); occasional chest tightness. No excess mucus or change in color of mucus. No productive or non-productive. No hemoptysis. No wheezing.  No chest wall deformity GI:  No heartburn, indigestion, abdominal pain, nausea, vomiting, diarrhea, change in bowel habits, loss of appetite, bloody stools.  GU: No dysuria, change in color of urine, urgency or frequency.   Skin: No rash, lesions, ulcerations MSK:  No joint pain or swelling.   Neuro: No dizziness or lightheadedness.  Psych: No depression or anxiety. Mood stable.     Physical Exam:  BP 124/74   Pulse 64   Temp 97.6 F (36.4 C) (Oral)   Ht  (1.956 m)   Wt 223 lb 9.6 oz (101.4 kg)   SpO2 97%   BMI 26.52 kg/m   GEN: Pleasant, interactive, well-appearing; in no acute distress HEENT:  Normocephalic and atraumatic. PERRLA. Sclera white. Nasal turbinates pink, moist and patent bilaterally. No rhinorrhea present. Oropharynx pink and moist, without exudate or edema. No lesions, ulcerations, or postnasal drip.  NECK:  Supple w/ fair ROM. No JVD present. Normal carotid impulses w/o bruits. Thyroid symmetrical with no goiter or nodules palpated. No lymphadenopathy.   CV: RRR, no m/r/g, no peripheral edema. Pulses intact, +2 bilaterally. No cyanosis, pallor or clubbing. PULMONARY:  Unlabored, regular breathing. Clear bilaterally A&P w/o wheezes/rales/rhonchi. No accessory muscle use.  GI: BS present and normoactive. Soft, non-tender to palpation. No organomegaly or masses detected.  MSK: No erythema, warmth or tenderness. Cap refil <2 sec all extrem. No deformities or joint swelling noted.  Neuro: A/Ox3. No focal deficits noted.   Skin: Warm, no lesions or rashe Psych: Normal affect and behavior. Judgement and thought content appropriate.     Lab Results:  CBC    Component Value Date/Time   WBC  7.7 05/23/2022 1057   RBC 4.96 05/23/2022 1057   HGB 16.1 05/23/2022 1057   HCT 48.1 05/23/2022 1057   PLT 299 05/24/2022 0638   MCV 97.0 05/23/2022 1057   MCH 32.5 05/23/2022 1057   MCHC 33.5 05/23/2022 1057   RDW 12.8 05/23/2022 1057   LYMPHSABS 1,980 07/21/2020 0752   EOSABS 281 07/21/2020 0752   BASOSABS 61 07/21/2020 0752    BMET    Component Value Date/Time   NA 137 05/24/2022 0638   K 4.2 05/23/2022 1057   CL 101 05/23/2022 1057   CO2 26 05/23/2022 1057   GLUCOSE 96 05/23/2022 1057   BUN 13 05/23/2022 1057   CREATININE 0.66 05/23/2022 1057   CREATININE 0.81 07/21/2020 0752   CALCIUM 9.8 05/23/2022 1057   GFRNONAA >60 05/23/2022 1057   GFRNONAA 115 07/21/2020 0752   GFRAA 133 07/21/2020 0752    BNP No results found for: "BNP"   Imaging:  No results  found.       Latest Ref Rng & Units 01/11/2023    8:31 AM  PFT Results  FVC-Pre L 5.18   FVC-Predicted Pre % 78   FVC-Post L 5.63   FVC-Predicted Post % 85   Pre FEV1/FVC % % 66   Post FEV1/FCV % % 71   FEV1-Pre L 3.42   FEV1-Predicted Pre % 65   FEV1-Post L 4.03   DLCO uncorrected ml/min/mmHg 25.77   DLCO UNC% % 66   DLVA Predicted % 81   TLC L 6.85   TLC % Predicted % 82   RV % Predicted % 120     No results found for: "NITRICOXIDE"      Assessment & Plan:   Asthma with COPD (HCC) Compensated on current regimen.  No decline in lung function based on pulmonary function testing from January 2024.  No recent exacerbations requiring steroids or antibiotics.  No hospitalizations.  He will continue on triple therapy regimen.  Action plan in place.  Counseled on smoking cessation.  Patient Instructions  Continue Albuterol inhaler 2 puffs every 6 hours as needed for shortness of breath or wheezing.  Continue Trelegy 1 puff daily. Brush tongue and rinse mouth afterwards  Pulmonary function testing was stable compared to October 2020. You still have a moderate obstruction in your lung function but  it gets better with albuterol, consistent with asthma.  Work on quitting smoking as much as you can   Follow up in 6 months with Dr. Jayme Cloud. If symptoms worsen, please contact office for sooner follow up or seek emergency care.    Allergic rhinitis Compensated on current regimen. He will continue nasocort   Tobacco use disorder Counseled on smoking cessation.    I spent 28 minutes of dedicated to the care of this patient on the date of this encounter to include pre-visit review of records, face-to-face time with the patient discussing conditions above, post visit ordering of testing, clinical documentation with the electronic health record, making appropriate referrals as documented, and communicating necessary findings to members of the patients care team.  Noemi Chapel, NP 04/18/2023  Pt aware and understands NP's role.

## 2023-04-18 NOTE — Assessment & Plan Note (Signed)
Compensated on current regimen.  No decline in lung function based on pulmonary function testing from January 2024.  No recent exacerbations requiring steroids or antibiotics.  No hospitalizations.  He will continue on triple therapy regimen.  Action plan in place.  Counseled on smoking cessation.  Patient Instructions  Continue Albuterol inhaler 2 puffs every 6 hours as needed for shortness of breath or wheezing.  Continue Trelegy 1 puff daily. Brush tongue and rinse mouth afterwards  Pulmonary function testing was stable compared to October 2020. You still have a moderate obstruction in your lung function but it gets better with albuterol, consistent with asthma.  Work on quitting smoking as much as you can   Follow up in 6 months with Dr. Jayme Cloud. If symptoms worsen, please contact office for sooner follow up or seek emergency care.

## 2023-04-18 NOTE — Patient Instructions (Addendum)
Continue Albuterol inhaler 2 puffs every 6 hours as needed for shortness of breath or wheezing.  Continue Trelegy 1 puff daily. Brush tongue and rinse mouth afterwards  Pulmonary function testing was stable compared to October 2020. You still have a moderate obstruction in your lung function but it gets better with albuterol, consistent with asthma.  Work on quitting smoking as much as you can   Follow up in 6 months with Dr. Jayme Cloud. If symptoms worsen, please contact office for sooner follow up or seek emergency care.

## 2023-04-18 NOTE — Assessment & Plan Note (Signed)
Compensated on current regimen. He will continue nasocort

## 2023-04-18 NOTE — Assessment & Plan Note (Signed)
Counseled on smoking cessation  

## 2023-04-24 NOTE — Progress Notes (Signed)
Agree with the details of the visit as noted by Katherine Cobb, NP. ° °C. Laura Murtaza Shell, MD °Advanced Bronchoscopy °PCCM  Pulmonary-[place] ° °

## 2023-05-04 ENCOUNTER — Encounter: Payer: Self-pay | Admitting: Pulmonary Disease

## 2023-06-03 ENCOUNTER — Other Ambulatory Visit: Payer: Self-pay | Admitting: Pulmonary Disease

## 2023-06-05 ENCOUNTER — Telehealth: Payer: Self-pay | Admitting: Psychiatry

## 2023-06-05 ENCOUNTER — Other Ambulatory Visit
Admission: RE | Admit: 2023-06-05 | Discharge: 2023-06-05 | Disposition: A | Discharge status: Home / Self Care | Payer: Medicare Other | Source: Ambulatory Visit | Attending: Psychiatry | Admitting: Psychiatry

## 2023-06-05 DIAGNOSIS — Z79899 Other long term (current) drug therapy: Secondary | ICD-10-CM | POA: Insufficient documentation

## 2023-06-05 DIAGNOSIS — F200 Paranoid schizophrenia: Secondary | ICD-10-CM | POA: Insufficient documentation

## 2023-06-05 LAB — LIPID PANEL
Cholesterol: 187 mg/dL (ref 0–200)
HDL: 65 mg/dL (ref >40)
LDL Cholesterol: 102 mg/dL — ABNORMAL HIGH (ref 0–99)
Total CHOL/HDL Ratio: 2.9 RATIO
Triglycerides: 101 mg/dL (ref <150)
VLDL: 20 mg/dL (ref 0–40)

## 2023-06-05 LAB — PLATELET COUNT: Platelets: 310 10*3/uL (ref 150–400)

## 2023-06-05 LAB — HEMOGLOBIN A1C
Hgb A1c MFr Bld: 5.3 % (ref 4.8–5.6)
Mean Plasma Glucose: 105.41 mg/dL

## 2023-06-05 LAB — SODIUM: Sodium: 132 mmol/L — ABNORMAL LOW (ref 135–145)

## 2023-06-05 LAB — HEPATIC FUNCTION PANEL
ALT: 22 U/L (ref 0–44)
AST: 23 U/L (ref 15–41)
Albumin: 4.4 g/dL (ref 3.5–5.0)
Alkaline Phosphatase: 42 U/L (ref 38–126)
Bilirubin, Direct: 0.1 mg/dL (ref 0.0–0.2)
Indirect Bilirubin: 0.9 mg/dL (ref 0.3–0.9)
Total Bilirubin: 1 mg/dL (ref 0.3–1.2)
Total Protein: 7.5 g/dL (ref 6.5–8.1)

## 2023-06-05 LAB — TSH: TSH: 0.85 u[IU]/mL (ref 0.350–4.500)

## 2023-06-05 LAB — VALPROIC ACID LEVEL: Valproic Acid Lvl: 64 ug/mL (ref 50.0–100.0)

## 2023-06-05 NOTE — Telephone Encounter (Signed)
Contacted patient to discuss sodium level being low at 132.  Will consider repeating this.  Unknown if Depakote is likely contributing to it.  Patient does have an appointment tomorrow and can discuss further steps.

## 2023-06-06 ENCOUNTER — Encounter: Payer: Self-pay | Admitting: Psychiatry

## 2023-06-06 ENCOUNTER — Ambulatory Visit (INDEPENDENT_AMBULATORY_CARE_PROVIDER_SITE_OTHER): Payer: Medicare Other | Admitting: Psychiatry

## 2023-06-06 ENCOUNTER — Encounter: Payer: Self-pay | Admitting: Family Medicine

## 2023-06-06 VITALS — BP 130/76 | HR 64 | Temp 97.3°F | Ht 77.0 in | Wt 221.6 lb

## 2023-06-06 DIAGNOSIS — F200 Paranoid schizophrenia: Secondary | ICD-10-CM

## 2023-06-06 DIAGNOSIS — F411 Generalized anxiety disorder: Secondary | ICD-10-CM

## 2023-06-06 DIAGNOSIS — Z79899 Other long term (current) drug therapy: Secondary | ICD-10-CM

## 2023-06-06 DIAGNOSIS — F17200 Nicotine dependence, unspecified, uncomplicated: Secondary | ICD-10-CM

## 2023-06-06 DIAGNOSIS — F1010 Alcohol abuse, uncomplicated: Secondary | ICD-10-CM | POA: Diagnosis not present

## 2023-06-06 DIAGNOSIS — E871 Hypo-osmolality and hyponatremia: Secondary | ICD-10-CM

## 2023-06-06 LAB — PROLACTIN: Prolactin: 11.4 ng/mL (ref 3.9–22.7)

## 2023-06-06 MED ORDER — RISPERIDONE 1 MG PO TABS
1.0000 mg | ORAL_TABLET | Freq: Every day | ORAL | 0 refills | Status: DC
Start: 2023-06-06 — End: 2023-08-07

## 2023-06-06 MED ORDER — DIVALPROEX SODIUM ER 250 MG PO TB24
250.0000 mg | ORAL_TABLET | Freq: Every day | ORAL | 0 refills | Status: DC
Start: 2023-06-06 — End: 2023-08-31

## 2023-06-06 MED ORDER — DIVALPROEX SODIUM ER 500 MG PO TB24: 500.0000 mg | ORAL_TABLET | Freq: Every day | ORAL | 0 refills | Status: DC

## 2023-06-06 NOTE — Progress Notes (Signed)
BH MD OP Progress Note  06/06/2023 5:08 PM Rodney [name]  MRN:  [id number]  Chief Complaint:  Chief Complaint  Patient presents with   Follow-up   Anxiety   Schizophrenia   Medication Refill   HPI: Rodney [name] is a 37 year old Caucasian male, employed, lives in [place], has a history of schizophrenia, GAD, alcohol abuse, cannabis abuse in remission, tobacco use disorder was evaluated in office today.  Patient today reports he is currently doing fairly well.  Denies any significant mood symptoms.  Denies any irritability.  Denies any psychosis.  Reports sleep as good.  Reports appetite is fair.  Patient reports work is going well.  He continues to work 12-hour shifts.  Patient had recent labs done, reviewed and discussed the same with patient.  Patient with recent sodium level being slightly lower than normal at 132.  Patient does report he has been drinking more water since it is getting warmer.  Patient agrees to reach out to primary care provider for any further testing as needed.  Patient also is interested in reducing the dosage of Depakote since he is currently doing well with regards to his mood symptoms.  Patient also aware Depakote likely also could contribute to low sodium level.  Patient reports he has been tolerating risperidone at the lower dosage and lowering the dosage did not worsen his mood symptoms.  He does have some tremors especially of his lower extremities on and off.  He does have Cogentin available and he agrees to use it as needed.  Patient denies any suicidality, homicidality or perceptual disturbances.  Patient denies any other concerns today.  Visit Diagnosis:    ICD-10-CM   1. Paranoid schizophrenia (HCC)  F20.0 divalproex (DEPAKOTE ER) 250 MG 24 hr tablet    divalproex (DEPAKOTE ER) 500 MG 24 hr tablet    risperiDONE (RISPERDAL) 1 MG tablet    2. GAD (generalized anxiety disorder)  F41.1 divalproex (DEPAKOTE ER) 250 MG 24 hr tablet    3. Alcohol use  disorder, mild, abuse  F10.10     4. Tobacco use disorder  F17.200     5. High risk medication use  Z79.899       Past Psychiatric History: I have reviewed past psychiatric history from progress note on 09/23/2018.  Past Medical History:  Past Medical History:  Diagnosis Date   Anxiety    Bipolar disorder (HCC)    CIGARETTE SMOKER 02/08/2011   Qualifier: Diagnosis of  By: Laural Benes MD, [place]     Seizures [facility])     Past Surgical History:  Procedure Laterality Date   WISDOM TOOTH EXTRACTION      Family Psychiatric History: I have reviewed family psychiatric history from progress note on 09/23/2018.  Family History:  Family History  Problem Relation Age of Onset   Anxiety disorder Mother    OCD Brother    Anxiety disorder Maternal Aunt    Anxiety disorder Maternal Grandmother    Anxiety disorder Paternal Grandfather     Social History: I have reviewed social history from progress note on 09/23/2018. Social History   Socioeconomic History   Marital status: Significant Other    Spouse name: Not on file   Number of children: 1   Years of education: Not on file   Highest education level: Associate degree: occupational, Scientist, product/process development, or vocational program  Occupational History   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    Occupation: Shipping and receiving  Tobacco Use  Smoking status: Every Day    Packs/day: 1.50    Years: 20.00    Additional pack years: 0.00    Total pack years: 30.00    Types: Cigarettes   Smokeless tobacco: Never   Tobacco comments:    1.5-2 ppd, not trying to quit.  09/05/22 hfb  Vaping Use   Vaping Use: Former  Substance and Sexual Activity   Alcohol use: Yes    Alcohol/week: 12.0 standard drinks of alcohol    Types: 12 Cans of beer per week    Comment: weekly   Drug use: Not Currently    Types: Marijuana, Other-see comments, LSD    Comment: in past   Sexual activity: Yes  Other Topics Concern   Not on file  Social History  Narrative   Not on file   Social Determinants of Health   Financial Resource Strain: Medium Risk (10/16/2022)   Overall Financial Resource Strain (CARDIA)    Difficulty of Paying Living Expenses: Somewhat hard  Food Insecurity: No Food Insecurity (10/16/2022)   Hunger Vital Sign    Worried About Running Out of Food in the Last Year: Never true    Ran Out of Food in the Last Year: Never true  Transportation Needs: No Transportation Needs (10/16/2022)   PRAPARE - Administrator, Civil Service (Medical): No    Lack of Transportation (Non-Medical): No  Physical Activity: Inactive (10/16/2022)   Exercise Vital Sign    Days of Exercise per Week: 0 days    Minutes of Exercise per Session: 0 min  Stress: No Stress Concern Present (10/16/2022)   [employer] of Occupational Health - Occupational Stress Questionnaire    Feeling of Stress : Not at all  Social Connections: Moderately Isolated (10/16/2022)   Social Connection and Isolation Panel [NHANES]    Frequency of Communication with Friends and Family: More than three times a week    Frequency of Social Gatherings with Friends and Family: More than three times a week    Attends Religious Services: Never    Database administrator or Organizations: No    Attends Banker Meetings: Never    Marital Status: Living with partner    Allergies:  Allergies  Allergen Reactions   Hydroxyzine Hcl Rash    Metabolic Disorder Labs: Lab Results  Component Value Date   HGBA1C 5.3 06/05/2023   MPG 105.41 06/05/2023   MPG 105 03/04/2020   Lab Results  Component Value Date   PROLACTIN 11.4 06/05/2023   Lab Results  Component Value Date   CHOL 187 06/05/2023   TRIG 101 06/05/2023   HDL 65 06/05/2023   CHOLHDL 2.9 06/05/2023   VLDL 20 06/05/2023   LDLCALC 102 (H) 06/05/2023   LDLCALC 84 03/04/2020   Lab Results  Component Value Date   TSH 0.850 06/05/2023   TSH 3.543 05/24/2022    Therapeutic Level  Labs: No results found for: "LITHIUM" Lab Results  Component Value Date   VALPROATE 64 06/05/2023   VALPROATE 61 05/24/2022   No results found for: "CBMZ"  Current Medications: Current Outpatient Medications  Medication Sig Dispense Refill   albuterol (PROAIR HFA) 108 (90 Base) MCG/ACT inhaler Inhale 2 puffs into the lungs every 6 (six) hours as needed for wheezing or shortness of breath. 8.5 each 2   benztropine (COGENTIN) 1 MG tablet TAKE 1 TABLET (1 MG TOTAL) BY MOUTH 2 (TWO) TIMES DAILY. FOR SIDE EFFECTS OF RISPERIDONE (Patient taking differently: Take 1  mg by mouth daily. Half a tablet) 180 tablet 0   divalproex (DEPAKOTE ER) 250 MG 24 hr tablet Take 1 tablet (250 mg total) by mouth daily. Take along with 500 mg , total dose of 750 mg daily 90 tablet 0   gabapentin (NEURONTIN) 600 MG tablet TAKE 1 TABLET BY MOUTH THREE TIMES A DAY 270 tablet 1   risperiDONE (RISPERDAL) 1 MG tablet Take 1 tablet (1 mg total) by mouth at bedtime. 90 tablet 0   TRELEGY ELLIPTA 200-62.5-25 MCG/ACT AEPB INHALE 1 PUFF BY MOUTH EVERY DAY 60 each 2   triamcinolone (NASACORT) 55 MCG/ACT AERO nasal inhaler PLACE 1 SPRAY INTO THE NOSE 2 (TWO) TIMES DAILY. 16.9 each 12   divalproex (DEPAKOTE ER) 500 MG 24 hr tablet Take 1 tablet (500 mg total) by mouth daily. Take along with 250 mg daily 90 tablet 0   No current facility-administered medications for this visit.     Musculoskeletal: Strength & Muscle Tone: within normal limits Gait & Station: normal Patient leans: N/A  Psychiatric Specialty Exam: Review of Systems  Musculoskeletal:        Restless legs on and off likely due to risperidone.  Psychiatric/Behavioral: Negative.      Blood pressure 130/76, pulse 64, temperature (!) 97.3 F (36.3 C), temperature source Skin, height 6\' 5"  (1.956 m), weight 221 lb 9.6 oz (100.5 kg).Body mass index is 26.28 kg/m.  General Appearance: Casual  Eye Contact:  Fair  Speech:  Clear and Coherent  Volume:  Normal   Mood:  Euthymic  Affect:  Congruent  Thought Process:  Goal Directed and Descriptions of Associations: Intact  Orientation:  Full (Time, Place, and Person)  Thought Content: Logical   Suicidal Thoughts:  No  Homicidal Thoughts:  No  Memory:  Immediate;   Fair Recent;   Fair Remote;   Fair  Judgement:  Fair  Insight:  Fair  Psychomotor Activity:  Normal  Concentration:  Concentration: Fair and Attention Span: Fair  Recall:  [employer] of Knowledge: Fair  Language: Fair  Akathisia:  No  Handed:  Right  AIMS (if indicated): not done  Assets:  Communication Skills Desire for Improvement Housing Social Support  ADL's:  Intact  Cognition: WNL  Sleep:  Fair   Screenings: Geneticist, molecular Office Visit from 02/07/2023 in [facility] Video Visit from 10/05/2022 in [facility] Video Visit from 08/01/2022 in [facility] Office Visit from 06/20/2022 in [facility] Office Visit from 05/23/2022 in [facility]  AIMS Total Score 0 0 0 0 0      AUDIT    Flowsheet Row Video Visit from 08/01/2022 in [facility]  Alcohol Use Disorder Identification Test Final Score (AUDIT) 15      GAD-7    Flowsheet Row Office Visit from 06/06/2023 in [facility] Office Visit from 02/07/2023 in [facility] Video Visit from 10/05/2022 in [facility] Office Visit from 06/20/2022 in [facility] Office Visit from 05/26/2022 in [facility]  Total GAD-7 Score 5 3 3 6 4       PHQ2-9    Flowsheet Row Office Visit from 06/06/2023 in [facility]  Office Visit from 02/07/2023 in [facility] Clinical Support from 10/16/2022 in [facility] Video Visit from 10/05/2022 in [facility] Video Visit from 08/01/2022 in [facility]  PHQ-2 Total Score 0 0 0 0 0  PHQ-9 Total Score -- 2 0 -- --      Flowsheet Row Office Visit from 06/06/2023 in [facility] Office Visit from 02/07/2023 in [facility] Video Visit from 10/05/2022 in [facility]  C-SSRS RISK CATEGORY No Risk No Risk No Risk        Assessment and Plan: Rodney [name] is a 37 year old Caucasian male, [place] in [place], has a history of schizophrenia, alcoholism, cannabis abuse in remission, anxiety disorder, tobacco use disorder was evaluated in office today.  Patient is currently stable however with recent hyponatremia, will benefit from the following plan.  Plan Schizophrenia-stable Will reduce Depakote ER to 750 milligram p.o. daily. Risperidone 1 mg p.o. daily-reduced dosage. Depakote level reviewed and discussed with patient 06/05/2023-64-therapeutic.  GAD-stable Gabapentin 600 mg p.o. 3 times daily-prescribed by primary care provider.   Alcohol use disorder mild-improving Currently patient is working on cutting back.  Tobacco use disorder-improving Patient to continue to cut back.    High risk medication use-reviewed and discussed labs-dated 06/05/2023-lipid panel-within normal limits, except for LDL slightly elevated at 102, hemoglobin A1c-5.3, prolactin level-11.4 valproic acid level-64-therapeutic, hepatic function panel-within normal limits, sodium low at 132-patient may need further testing and agrees to reach out to primary care provider.  Platelet count, TSH-within normal limits.  I have coordinated care with  Dr. Gretel Acre primary care provider.  Follow-up in clinic in 2 months or sooner if needed.  Collaboration of Care: Collaboration of Care: Primary Care Provider AEB patient to follow up with primary care provider for low sodium level.  Patient/Guardian was advised Release of Information must be obtained prior to any record release in order to collaborate their care with an outside provider. Patient/Guardian was advised if they have not already done so to contact the registration department to sign all necessary forms in order for [place] to release information regarding their care.   Consent: Patient/Guardian gives verbal consent for treatment and assignment of benefits for services provided during this visit. Patient/Guardian expressed understanding and agreed to proceed.   This note was generated in part or whole with voice recognition software. Voice recognition is usually quite accurate but there are transcription errors that can and very often do occur. I apologize for any typographical errors that were not detected and corrected.    Jomarie Longs, MD 06/07/2023, 9:32 AM

## 2023-06-08 NOTE — Addendum Note (Signed)
Addended by: Smitty Cords on: 06/08/2023 06:43 PM   Modules accepted: Orders

## 2023-06-17 ENCOUNTER — Other Ambulatory Visit: Payer: Self-pay | Admitting: Psychiatry

## 2023-06-17 DIAGNOSIS — F200 Paranoid schizophrenia: Secondary | ICD-10-CM

## 2023-06-21 ENCOUNTER — Other Ambulatory Visit: Payer: Self-pay | Admitting: Psychiatry

## 2023-06-21 DIAGNOSIS — F200 Paranoid schizophrenia: Secondary | ICD-10-CM

## 2023-07-05 DIAGNOSIS — E871 Hypo-osmolality and hyponatremia: Secondary | ICD-10-CM | POA: Diagnosis not present

## 2023-07-06 LAB — COMPREHENSIVE METABOLIC PANEL
ALT: 33 IU/L (ref 0–44)
Alkaline Phosphatase: 65 IU/L (ref 44–121)
BUN/Creatinine Ratio: 10 (ref 9–20)
CO2: 24 mmol/L (ref 20–29)
Calcium: 10 mg/dL (ref 8.7–10.2)
Chloride: 99 mmol/L (ref 96–106)
Creatinine, Ser: 0.87 mg/dL (ref 0.76–1.27)
Globulin, Total: 2.6 g/dL (ref 1.5–4.5)
Glucose: 120 mg/dL — ABNORMAL HIGH (ref 70–99)
Potassium: 4.7 mmol/L (ref 3.5–5.2)
Total Protein: 7.5 g/dL (ref 6.0–8.5)
eGFR: 113 mL/min/{1.73_m2} (ref >59)

## 2023-07-06 LAB — OSMOLALITY

## 2023-07-06 LAB — OSMOLALITY, URINE

## 2023-07-07 LAB — COMPREHENSIVE METABOLIC PANEL
AST: 27 IU/L (ref 0–40)
Albumin: 4.9 g/dL (ref 4.1–5.1)
BUN: 9 mg/dL (ref 6–20)
Bilirubin Total: 0.5 mg/dL (ref 0.0–1.2)
Sodium: 140 mmol/L (ref 134–144)

## 2023-07-07 LAB — SODIUM, URINE, RANDOM: Sodium, Ur: 43 mmol/L

## 2023-07-27 ENCOUNTER — Other Ambulatory Visit: Payer: Self-pay | Admitting: Family Medicine

## 2023-07-27 DIAGNOSIS — F1020 Alcohol dependence, uncomplicated: Secondary | ICD-10-CM

## 2023-07-27 DIAGNOSIS — F411 Generalized anxiety disorder: Secondary | ICD-10-CM

## 2023-07-27 NOTE — Telephone Encounter (Signed)
Requested Prescriptions  Pending Prescriptions Disp Refills   gabapentin (NEURONTIN) 600 MG tablet [Pharmacy Med Name: GABAPENTIN 600 MG TABLET] 270 tablet 0    Sig: TAKE 1 TABLET BY MOUTH THREE TIMES A DAY     Neurology: Anticonvulsants - gabapentin Passed - 07/27/2023 11:33 AM      Passed - Cr in normal range and within 360 days    Creat  Date Value Ref Range Status  07/21/2020 0.81 0.60 - 1.35 mg/dL Final   Creatinine, Ser  Date Value Ref Range Status  07/05/2023 0.87 0.76 - 1.27 mg/dL Final         Passed - Completed PHQ-2 or PHQ-9 in the last 360 days      Passed - Valid encounter within last 12 months    Recent Outpatient Visits           1 year ago Pleurisy   [place] [facility] Smitty Cords, DO   1 year ago Paranoid schizophrenia [facility])   [place] [facility] Smitty Cords, DO   1 year ago Acute viral syndrome   [place] [facility] Smitty Cords, DO   1 year ago Influenza A   [place] [facility] Smitty Cords, DO   2 years ago LUQ abdominal pain   [place] [facility] [place], [place], [place]

## 2023-08-07 ENCOUNTER — Encounter: Payer: Self-pay | Admitting: Psychiatry

## 2023-08-07 ENCOUNTER — Ambulatory Visit (INDEPENDENT_AMBULATORY_CARE_PROVIDER_SITE_OTHER): Payer: Medicare Other | Admitting: Psychiatry

## 2023-08-07 VITALS — BP 128/80 | HR 62 | Temp 98.1°F | Ht 77.0 in | Wt 220.4 lb

## 2023-08-07 DIAGNOSIS — F17200 Nicotine dependence, unspecified, uncomplicated: Secondary | ICD-10-CM | POA: Diagnosis not present

## 2023-08-07 DIAGNOSIS — F1010 Alcohol abuse, uncomplicated: Secondary | ICD-10-CM | POA: Diagnosis not present

## 2023-08-07 DIAGNOSIS — F200 Paranoid schizophrenia: Secondary | ICD-10-CM | POA: Diagnosis not present

## 2023-08-07 DIAGNOSIS — F411 Generalized anxiety disorder: Secondary | ICD-10-CM

## 2023-08-07 DIAGNOSIS — Z79899 Other long term (current) drug therapy: Secondary | ICD-10-CM

## 2023-08-07 MED ORDER — BENZTROPINE MESYLATE 1 MG PO TABS
0.5000 mg | ORAL_TABLET | Freq: Two times a day (BID) | ORAL | Status: AC | PRN
Start: 2023-08-07 — End: ?

## 2023-08-07 MED ORDER — RISPERIDONE 0.25 MG PO TABS
0.7500 mg | ORAL_TABLET | Freq: Every day | ORAL | 1 refills | Status: DC
Start: 2023-08-07 — End: 2023-09-10

## 2023-08-07 NOTE — Progress Notes (Unsigned)
BH MD OP Progress Note  08/07/2023 5:11 PM Rodney [name]  MRN:  [id number]  Chief Complaint:  Chief Complaint  Patient presents with   Follow-up   Anxiety   Depression   Medication Refill   HPI: Rodney [name] is a 37 year old Caucasian male, employed, lives in [place], has a history of schizophrenia, GAD, alcohol abuse, cannabis abuse in remission, tobacco use disorder was evaluated in office today.  Patient today reports he is currently doing well on the lower dosage of risperidone.  Does continue to have episodic tremors however he is managing it by using Cogentin.  Currently uses 1/2 tablet of 1 mg twice a day as needed.  That does help.  Patient does report anxiety due to current situational stressors of work, finances.  Although he has been coping okay.  Patient denies any hallucinations, paranoia.  Reports sleep as good.  Reports appetite is fair.  Patient denies any suicidality or homicidality.  Patient is compliant on the Depakote.  Denies any side effects.  Patient reports he had sodium level repeated at his primary care provider's office.  I have reviewed and discussed sodium lab-07/05/2023-140.  Patient reports he is cutting back on his alcohol use, current he uses alcohol only on weekends.  He denied any binging episodes.  Denies any other concerns today.  Visit Diagnosis:    ICD-10-CM   1. Paranoid schizophrenia (HCC)  F20.0 benztropine (COGENTIN) 1 MG tablet    risperiDONE (RISPERDAL) 0.25 MG tablet    2. GAD (generalized anxiety disorder)  F41.1     3. Alcohol use disorder, mild, abuse  F10.10    in partial remission    4. Tobacco use disorder  F17.200     5. High risk medication use  Z79.899       Past Psychiatric History: I have reviewed past psychiatric history from progress note on 09/23/2018.  Past Medical History:  Past Medical History:  Diagnosis Date   Anxiety    Bipolar disorder (HCC)    CIGARETTE SMOKER 02/08/2011   Qualifier: Diagnosis of  By:  Laural Benes MD, [place]     Seizures [facility])     Past Surgical History:  Procedure Laterality Date   WISDOM TOOTH EXTRACTION      Family Psychiatric History: I have reviewed family psychiatric history from progress note on 09/23/2018.  Family History:  Family History  Problem Relation Age of Onset   Anxiety disorder Mother    OCD Brother    Anxiety disorder Maternal Aunt    Anxiety disorder Maternal Grandmother    Anxiety disorder Paternal Grandfather     Social History: I have reviewed social history from progress note on 09/23/2018. 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  09/05/22 hfb  Vaping Use   Vaping status: Former  Substance and Sexual Activity   Alcohol use: Yes  Alcohol/week: 12.0 standard drinks of alcohol    Types: 12 Cans of beer per week    Comment: weekly   Drug use: Not Currently    Types: Marijuana, Other-see comments, LSD    Comment: in past   Sexual activity: Yes  Other Topics Concern   Not on file  Social History Narrative   Not on file   Social Determinants of Health   Financial Resource Strain: Medium Risk (10/16/2022)   Overall Financial Resource Strain (CARDIA)    Difficulty of Paying Living Expenses: Somewhat hard  Food Insecurity: No Food Insecurity (10/16/2022)   Hunger Vital Sign    Worried About Running Out of Food in the Last Year: Never true    Ran Out of Food in the  Last Year: Never true  Transportation Needs: No Transportation Needs (10/16/2022)   PRAPARE - Administrator, Civil Service (Medical): No    Lack of Transportation (Non-Medical): No  Physical Activity: Inactive (10/16/2022)   Exercise Vital Sign    Days of Exercise per Week: 0 days    Minutes of Exercise per Session: 0 min  Stress: No Stress Concern Present (10/16/2022)   [employer] of Occupational Health - Occupational Stress Questionnaire    Feeling of Stress : Not at all  Social Connections: Moderately Isolated (10/16/2022)   Social Connection and Isolation Panel [NHANES]    Frequency of Communication with Friends and Family: More than three times a week    Frequency of Social Gatherings with Friends and Family: More than three times a week    Attends Religious Services: Never    Database administrator or Organizations: No    Attends Banker Meetings: Never    Marital Status: Living with partner    Allergies:  Allergies  Allergen Reactions   Hydroxyzine Hcl Rash    Metabolic Disorder Labs: Lab Results  Component Value Date   HGBA1C 5.3 06/05/2023   MPG 105.41 06/05/2023   MPG 105 03/04/2020   Lab Results  Component Value Date   PROLACTIN 11.4 06/05/2023   Lab Results  Component Value Date   CHOL 187 06/05/2023   TRIG 101 06/05/2023   HDL 65 06/05/2023   CHOLHDL 2.9 06/05/2023   VLDL 20 06/05/2023   LDLCALC 102 (H) 06/05/2023   LDLCALC 84 03/04/2020   Lab Results  Component Value Date   TSH 0.850 06/05/2023   TSH 3.543 05/24/2022    Therapeutic Level Labs: No results found for: "LITHIUM" Lab Results  Component Value Date   VALPROATE 64 06/05/2023   VALPROATE 61 05/24/2022   No results found for: "CBMZ"  Current Medications: Current Outpatient Medications  Medication Sig Dispense Refill   albuterol (PROAIR HFA) 108 (90 Base) MCG/ACT inhaler Inhale 2 puffs into the lungs every 6 (six) hours as needed for wheezing or  shortness of breath. 8.5 each 2   divalproex (DEPAKOTE ER) 250 MG 24 hr tablet Take 1 tablet (250 mg total) by mouth daily. Take along with 500 mg , total dose of 750 mg daily 90 tablet 0   divalproex (DEPAKOTE ER) 500 MG 24 hr tablet Take 1 tablet (500 mg total) by mouth daily. Take along with 250 mg daily 90 tablet 0   gabapentin (NEURONTIN) 600 MG tablet TAKE 1 TABLET BY MOUTH THREE TIMES A DAY 270 tablet 0   risperiDONE (RISPERDAL) 0.25 MG tablet Take 3 tablets (0.75 mg total) by mouth at bedtime. 90 tablet 1   TRELEGY  ELLIPTA 200-62.5-25 MCG/ACT AEPB INHALE 1 PUFF BY MOUTH EVERY DAY 60 each 2   triamcinolone (NASACORT) 55 MCG/ACT AERO nasal inhaler PLACE 1 SPRAY INTO THE NOSE 2 (TWO) TIMES DAILY. 16.9 each 12   benztropine (COGENTIN) 1 MG tablet Take 0.5 tablets (0.5 mg total) by mouth 2 (two) times daily as needed for tremors.     No current facility-administered medications for this visit.     Musculoskeletal: Strength & Muscle Tone: within normal limits Gait & Station: normal Patient leans: N/A  Psychiatric Specialty Exam: Review of Systems  Psychiatric/Behavioral:  The patient is nervous/anxious.     Blood pressure 128/80, pulse 62, temperature 98.1 F (36.7 C), temperature source Temporal, height 6\' 5"  (1.956 m), weight 220 lb 6.4 oz (100 kg), SpO2 95%.Body mass index is 26.14 kg/m.  General Appearance: Fairly Groomed  Eye Contact:  Fair  Speech:  Clear and Coherent  Volume:  Normal  Mood:  Anxious coping well  Affect:  Appropriate  Thought Process:  Goal Directed and Descriptions of Associations: Intact  Orientation:  Full (Time, Place, and Person)  Thought Content: Logical   Suicidal Thoughts:  No  Homicidal Thoughts:  No  Memory:  Immediate;   Fair Recent;   Fair Remote;   Fair  Judgement:  Fair  Insight:  Fair  Psychomotor Activity:  Normal  Concentration:  Concentration: Fair and Attention Span: Fair  Recall:  [employer] of Knowledge: Fair  Language: Fair   Akathisia:  No  Handed:  Right  AIMS (if indicated): done  Assets:  Communication Skills Desire for Improvement Housing Social Support  ADL's:  Intact  Cognition: WNL  Sleep:  Fair   Screenings: Geneticist, molecular Office Visit from 08/07/2023 in [facility] Office Visit from 02/07/2023 in [facility] Video Visit from 10/05/2022 in [facility] Video Visit from 08/01/2022 in [facility] Office Visit from 06/20/2022 in [facility]  AIMS Total Score 0 0 0 0 0      AUDIT    Flowsheet Row Video Visit from 08/01/2022 in [facility]  Alcohol Use Disorder Identification Test Final Score (AUDIT) 15      GAD-7    Flowsheet Row Office Visit from 08/07/2023 in [facility] Office Visit from 06/06/2023 in [facility] Office Visit from 02/07/2023 in [facility] Video Visit from 10/05/2022 in [facility] Office Visit from 06/20/2022 in [facility]  Total GAD-7 Score 5 5 3 3 6       PHQ2-9    Flowsheet Row Office Visit from 08/07/2023 in [facility] Office Visit from 06/06/2023 in [facility] Office Visit from 02/07/2023 in [facility] Clinical Support from 10/16/2022 in [facility] Video Visit from 10/05/2022 in [facility]  PHQ-2 Total Score 0 0 0 0 0  PHQ-9 Total Score 0 -- 2 0 --      Flowsheet Row Office Visit from 08/07/2023 in [facility] Office Visit from 06/06/2023 in [facility] Office Visit from 02/07/2023 in [facility]  C-SSRS RISK CATEGORY No Risk No  Risk No Risk        Assessment and Plan: Rodney [name] is a 37 year old Caucasian male, lives in [place], has a history of schizophrenia, alcoholism, cannabis abuse in remission, anxiety disorder, tobacco use disorder was evaluated in office today.  Patient is currently stable, agreeable to dose reduction of risperidone further, plan as noted below.  Plan Schizophrenia-stable Continue Depakote ER 750 mg p.o. daily Reduce risperidone to 0.75 mg p.o. nightly.  Risperidone has multiple long-term adverse side effects and since patient has been stable we will try to reduce the dosage.  Patient agreeable. Depakote level-06/05/2023-64-therapeutic  GAD-stable Gabapentin 600 mg p.o. 3 times daily.  Alcohol use disorder mild in partial remission Patient to continue to stay away and cut back.  Tobacco use disorder-improving Patient to continue to work on this.  High risk medication use-I have reviewed and discussed sodium level as noted above-07/05/2023-140.  Follow-up in clinic in 2 months or sooner in person.   Consent: Patient/Guardian gives verbal consent for treatment and assignment of benefits for services provided during this visit. Patient/Guardian expressed understanding and agreed to proceed.   This note was generated in part or whole with voice recognition software. Voice recognition is usually quite accurate but there are transcription errors that can and very often do occur. I apologize for any typographical errors that were not detected and corrected.    Jomarie Longs, MD 08/08/2023, 10:26 AM

## 2023-08-28 ENCOUNTER — Other Ambulatory Visit: Payer: Self-pay | Admitting: Psychiatry

## 2023-08-28 DIAGNOSIS — F200 Paranoid schizophrenia: Secondary | ICD-10-CM

## 2023-08-31 ENCOUNTER — Other Ambulatory Visit: Payer: Self-pay | Admitting: Psychiatry

## 2023-08-31 DIAGNOSIS — F411 Generalized anxiety disorder: Secondary | ICD-10-CM

## 2023-08-31 DIAGNOSIS — F200 Paranoid schizophrenia: Secondary | ICD-10-CM

## 2023-08-31 MED ORDER — DIVALPROEX SODIUM ER 250 MG PO TB24
250.0000 mg | ORAL_TABLET | Freq: Every day | ORAL | 0 refills | Status: DC
Start: 2023-08-31 — End: 2023-11-26

## 2023-08-31 NOTE — Telephone Encounter (Signed)
I have sent Depakote to pharmacy. 

## 2023-09-01 ENCOUNTER — Other Ambulatory Visit: Payer: Self-pay | Admitting: Psychiatry

## 2023-09-01 DIAGNOSIS — F200 Paranoid schizophrenia: Secondary | ICD-10-CM

## 2023-09-03 ENCOUNTER — Ambulatory Visit: Payer: Self-pay | Admitting: *Deleted

## 2023-09-03 NOTE — Telephone Encounter (Signed)
  Chief Complaint: Abdominal Pain Symptoms: Upper, mid abdominal pain, comes and goes, mostly after eating. States then has diarrhea type loose stool and pain resolves. 7-8/10 when occurs Frequency: 10 days Pertinent Negatives: Patient denies  Disposition: [] ED /[] Urgent Care (no appt availability in office) / [x] Appointment(In office/virtual)/ []  [place] [facility]/ [] Home Care/ [] Refused Recommended Disposition /[] [place] [facility]/ []  Follow-up with PCP Additional Notes: No availability at practice. Appt secured with Denny Peon at [facility] for tomorrow. Care advise provided, pt verbalizes understanding.  Reason for Disposition  [1] MODERATE pain (e.g., interferes with normal activities) AND [2] pain comes and goes (cramps) AND [3] present > 24 hours  (Exception: Pain with Vomiting or Diarrhea - see that Guideline.)  Answer Assessment - Initial Assessment Questions 1. LOCATION: "Where does it hurt?"      Mid, upper 2. RADIATION: "Does the pain shoot anywhere else?" (e.g., chest, back)     No 3. ONSET: "When did the pain begin?" (Minutes, hours or days ago)      10 days ago 4. SUDDEN: "Gradual or sudden onset?"     Gradual 5. PATTERN "Does the pain come and go, or is it constant?"    - If it comes and goes: "How long does it last?" "Do you have pain now?"     (Note: Comes and goes means the pain is intermittent. It goes away completely between bouts.)    - If constant: "Is it getting better, staying the same, or getting worse?"      (Note: Constant means the pain never goes away completely; most serious pain is constant and gets worse.)      Comes and goes 6. SEVERITY: "How bad is the pain?"  (e.g., Scale 1-10; mild, moderate, or severe)    - MILD (1-3): Doesn't interfere with normal activities, abdomen soft and not tender to touch.     - MODERATE (4-7): Interferes with normal activities or awakens from sleep, abdomen tender to touch.     - SEVERE (8-10): Excruciating pain, doubled  over, unable to do any normal activities.       7-8/10 when occurs 7. RECURRENT SYMPTOM: "Have you ever had this type of stomach pain before?" If Yes, ask: "When was the last time?" and "What happened that time?"      No 8. CAUSE: "What do you think is causing the stomach pain?"     Unsure 9. RELIEVING/AGGRAVATING FACTORS: "What makes it better or worse?" (e.g., antacids, bending or twisting motion, bowel movement)     No 10. OTHER SYMPTOMS: "Do you have any other symptoms?" (e.g., back pain, diarrhea, fever, urination pain, vomiting)       Normal stools, the loose  Protocols used: Abdominal Pain - Male-A-AH

## 2023-09-04 ENCOUNTER — Ambulatory Visit (INDEPENDENT_AMBULATORY_CARE_PROVIDER_SITE_OTHER): Payer: Medicare Other | Admitting: Physician Assistant

## 2023-09-04 ENCOUNTER — Encounter: Payer: Self-pay | Admitting: Physician Assistant

## 2023-09-04 VITALS — BP 106/66 | HR 62 | Ht 77.0 in | Wt 218.2 lb

## 2023-09-04 DIAGNOSIS — R1084 Generalized abdominal pain: Secondary | ICD-10-CM | POA: Diagnosis not present

## 2023-09-04 NOTE — Patient Instructions (Signed)
   I recommend trying to use Pepto Bismol to help with your GI upset You can also try Pepcid or TUMS to settle it a bit and try to eat a bland diet for the next few days to a week Once you are feeling better you can gradually return to your normal diet

## 2023-09-04 NOTE — Progress Notes (Signed)
Acute Office Visit   Patient: Rodney [name]   DOB: Jul 28, 1986   37 y.o. Male  MRN: [id number] Visit Date: 09/04/2023  Today's healthcare provider: Oswaldo Conroy Aven Cegielski, PA-C  Introduced myself to the patient as a Secondary school teacher and provided education on APPs in clinical practice.    Chief Complaint  Patient presents with   Abdominal Pain    Patient says he has a history of heavy drinking and has been slowly whining himself off alcohol. Patient says he has noticed a change in stomach. Patient says he has cramping in his abdomen. Patient when he goes to the bathroom, it will take a while and then explosive diarrhea. Patient says he has a bowel movement about every day. Patient is concerned about a food allergy or just possibly think it may be due to him recently stop drinking. Patient says he notices the pain and discomfort after he eats.    Subjective    HPI HPI     Abdominal Pain    Additional comments: Patient says he has a history of heavy drinking and has been slowly whining himself off alcohol. Patient says he has noticed a change in stomach. Patient says he has cramping in his abdomen. Patient when he goes to the bathroom, it will take a while and then explosive diarrhea. Patient says he has a bowel movement about every day. Patient is concerned about a food allergy or just possibly think it may be due to him recently stop drinking. Patient says he notices the pain and discomfort after he eats.       Last edited by Malen Gauze, CMA on 09/04/2023  9:13 AM.       Abdominal Pain   He repots pain has been ongoing for about a week He states after eating dinner - 30-60 minutes after eating he starts having sharp pain and cramping in epigastric area Then he goes to the bathroom - starts off as normal stool then turns into liquid diarrhea States the pain gets better after bowel movement  He reports sometimes feeling nauseous and salivating a bit while having bowel movement- doesn't usually  vomit He denies recent changes to his diet - he states he is drinking gatorade zeros at night to help replace alcohol use   He denies anyone around him having similar symptoms to his knowledge  Reports he used to have regular bowel movement in the mornings but this has changed    He reports he stopped drinking 2.5 weeks ago  Was a daily drinker prior to this    He reports his psychiatry provider has recently lowered his medication doses     Medications: Outpatient Medications Prior to Visit  Medication Sig   albuterol (PROAIR HFA) 108 (90 Base) MCG/ACT inhaler Inhale 2 puffs into the lungs every 6 (six) hours as needed for wheezing or shortness of breath.   benztropine (COGENTIN) 1 MG tablet Take 0.5 tablets (0.5 mg total) by mouth 2 (two) times daily as needed for tremors.   divalproex (DEPAKOTE ER) 250 MG 24 hr tablet Take 1 tablet (250 mg total) by mouth daily. Take along with 500 mg , total dose of 750 mg daily   divalproex (DEPAKOTE ER) 500 MG 24 hr tablet TAKE 1 TABLET (500 MG TOTAL) BY MOUTH DAILY. TAKE ALONG WITH 250 MG DAILY   gabapentin (NEURONTIN) 600 MG tablet TAKE 1 TABLET BY MOUTH THREE TIMES A DAY   risperiDONE (RISPERDAL) 0.25  MG tablet Take 3 tablets (0.75 mg total) by mouth at bedtime.   TRELEGY ELLIPTA 200-62.5-25 MCG/ACT AEPB INHALE 1 PUFF BY MOUTH EVERY DAY   triamcinolone (NASACORT) 55 MCG/ACT AERO nasal inhaler PLACE 1 SPRAY INTO THE NOSE 2 (TWO) TIMES DAILY.   No facility-administered medications prior to visit.    Review of Systems  Constitutional:  Negative for chills, fatigue and fever.  Gastrointestinal:  Positive for abdominal pain, diarrhea and nausea. Negative for blood in stool, constipation and vomiting.  Genitourinary:  Negative for dysuria.        Objective    BP 106/66   Pulse 62   Ht 6\' 5"  (1.956 m)   Wt 218 lb 3.2 oz (99 kg)   SpO2 96%   BMI 25.87 kg/m     Physical Exam Vitals reviewed.  Constitutional:      Appearance: He is  well-developed.  HENT:     Head: Normocephalic and atraumatic.  Pulmonary:     Effort: Pulmonary effort is normal.  Abdominal:     General: Abdomen is flat. Bowel sounds are increased.     Palpations: Abdomen is soft.     Tenderness: There is abdominal tenderness in the right lower quadrant and left lower quadrant. There is no guarding or rebound. Negative signs include Murphy's sign and McBurney's sign.  Skin:    General: Skin is warm and dry.  Neurological:     General: No focal deficit present.     Mental Status: He is alert and oriented to person, place, and time.  Psychiatric:        Mood and Affect: Mood normal.        Behavior: Behavior normal.       No results found for any visits on 09/04/23.  Assessment & Plan      No follow-ups on file.      Problem List Items Addressed This Visit   None Visit Diagnoses     Generalized abdominal pain    -  Primary Acute, new concern Patient reports abdominal pain ongoing or about a week occurring after he eats largest meal of the day and appears to be relieved with bowel movement PE is overall normal at this time Will check CMP, CBC, lipase and amylase for rule out- results to dictate further management Differential includes but is not limited to: gastritis, IBS, alcohol withdrawal, cholecystitis, GERD, pancreatitis Recommend using Pepcid and Pepto for management at this time along with bland diet to assist with GI upset. Reviewed making sure he is staying hydrated and avoiding excess sugar as this can increase diarrhea.  Reviewed ED and return precautions Follow up as needed for persistent or progressing symptoms     Relevant Orders   Comp Met (CMET)   CBC w/Diff   Lipase   Amylase        No follow-ups on file.   I, Sai Zinn E Keiji Melland, PA-C, have reviewed all documentation for this visit. The documentation on 09/04/23 for the exam, diagnosis, procedures, and orders are all accurate and complete.   Jacquelin Hawking, MHS,  PA-C [facility]

## 2023-09-05 ENCOUNTER — Other Ambulatory Visit: Payer: Self-pay | Admitting: Pulmonary Disease

## 2023-09-05 LAB — CBC WITH DIFFERENTIAL/PLATELET
Basophils Absolute: 0.1 10*3/uL (ref 0.0–0.2)
Basos: 1 %
EOS (ABSOLUTE): 0.2 10*3/uL (ref 0.0–0.4)
Eos: 3 %
Hematocrit: 44.5 % (ref 37.5–51.0)
Hemoglobin: 14.9 g/dL (ref 13.0–17.7)
Immature Grans (Abs): 0 10*3/uL (ref 0.0–0.1)
Immature Granulocytes: 0 %
Lymphocytes Absolute: 2.8 10*3/uL (ref 0.7–3.1)
Lymphs: 43 %
MCH: 33.3 pg — ABNORMAL HIGH (ref 26.6–33.0)
MCHC: 33.5 g/dL (ref 31.5–35.7)
MCV: 99 fL — ABNORMAL HIGH (ref 79–97)
Monocytes Absolute: 0.7 10*3/uL (ref 0.1–0.9)
Monocytes: 10 %
Neutrophils Absolute: 2.7 10*3/uL (ref 1.4–7.0)
Neutrophils: 43 %
Platelets: 352 10*3/uL (ref 150–450)
RBC: 4.48 x10E6/uL (ref 4.14–5.80)
RDW: 11.8 % (ref 11.6–15.4)
WBC: 6.4 10*3/uL (ref 3.4–10.8)

## 2023-09-05 LAB — COMPREHENSIVE METABOLIC PANEL
ALT: 19 IU/L (ref 0–44)
AST: 15 IU/L (ref 0–40)
Albumin: 4.5 g/dL (ref 4.1–5.1)
Alkaline Phosphatase: 48 IU/L (ref 44–121)
BUN/Creatinine Ratio: 4 — ABNORMAL LOW (ref 9–20)
BUN: 3 mg/dL — ABNORMAL LOW (ref 6–20)
Bilirubin Total: 0.3 mg/dL (ref 0.0–1.2)
CO2: 25 mmol/L (ref 20–29)
Calcium: 9.7 mg/dL (ref 8.7–10.2)
Chloride: 98 mmol/L (ref 96–106)
Creatinine, Ser: 0.73 mg/dL — ABNORMAL LOW (ref 0.76–1.27)
Globulin, Total: 2.3 g/dL (ref 1.5–4.5)
Glucose: 81 mg/dL (ref 70–99)
Potassium: 4.6 mmol/L (ref 3.5–5.2)
Sodium: 136 mmol/L (ref 134–144)
Total Protein: 6.8 g/dL (ref 6.0–8.5)
eGFR: 119 mL/min/{1.73_m2} (ref >59)

## 2023-09-05 LAB — AMYLASE: Amylase: 39 U/L (ref 31–110)

## 2023-09-05 LAB — LIPASE: Lipase: 28 U/L (ref 13–78)

## 2023-09-06 ENCOUNTER — Telehealth: Payer: Self-pay

## 2023-09-06 NOTE — Telephone Encounter (Signed)
Copied from CRM [id number]. Topic: General - Other >> Sep 05, 2023  2:59 PM Everette C wrote: Reason for CRM: The patient would like to be contacted by a member of clinical staff when possible to review their labs from 09/04/23  Please contact further when possible

## 2023-09-06 NOTE — Progress Notes (Signed)
Your labs are back Your electrolytes, liver and kidney function were overall normal at this time Your CBC was overall normal- no evidence of anemia or elevated white count (which can sometimes indicate an infection) Your pancreas testing was normal as well.  I recommend that you proceed with the interventions discussed at your visit for now. These are included in your AVS. If you have further concerns or your symptoms get worse please call your PCP office for follow up

## 2023-09-07 ENCOUNTER — Telehealth: Payer: Self-pay

## 2023-09-07 ENCOUNTER — Encounter: Payer: Self-pay | Admitting: Family Medicine

## 2023-09-07 ENCOUNTER — Other Ambulatory Visit: Payer: Self-pay | Admitting: Psychiatry

## 2023-09-07 DIAGNOSIS — F200 Paranoid schizophrenia: Secondary | ICD-10-CM

## 2023-09-07 NOTE — Telephone Encounter (Signed)
Pt given lab results per notes of Erin on 09/06/23. Pt verbalized understanding. Patient states his BUN (3) and BUN/Creatinine Ratio (4) both are low and would like that explained to him. Patient requested PCP review the labs and give additional recommendations. Advised I would forward message to PCP at this time Your labs are back Your electrolytes, liver and kidney function were overall normal at this time Your CBC was overall normal- no evidence of anemia or elevated white count (which can sometimes indicate an infection) Your pancreas testing was normal as well. I recommend that you proceed with the interventions discussed at your visit for now. These are included in your AVS. If you have further concerns or your symptoms get worse please call your PCP office for follow up  Written by Providence Crosby, PA-C on 09/06/2023  4:18 PM EDT

## 2023-09-07 NOTE — Telephone Encounter (Signed)
MyChart note was sent.  He was provided all lab results on a result note previously. The ones in question were actually normal but marked as lower by the system since they were technically out of range. I agree with the result note given to the patient. The BUN and Creatinine are essentially normal and not a concern. I sent patient this info on a mychart message  Saralyn Pilar, DO [facility] 09/07/2023, 5:39 PM

## 2023-09-25 ENCOUNTER — Other Ambulatory Visit: Payer: Self-pay | Admitting: Child and Adolescent Psychiatry

## 2023-09-25 DIAGNOSIS — F200 Paranoid schizophrenia: Secondary | ICD-10-CM

## 2023-10-02 ENCOUNTER — Encounter: Payer: Self-pay | Admitting: Family Medicine

## 2023-10-02 ENCOUNTER — Ambulatory Visit (INDEPENDENT_AMBULATORY_CARE_PROVIDER_SITE_OTHER): Payer: Medicare Other | Admitting: Family Medicine

## 2023-10-02 VITALS — BP 114/76 | HR 57 | Ht 77.0 in | Wt 213.0 lb

## 2023-10-02 DIAGNOSIS — Z23 Encounter for immunization: Secondary | ICD-10-CM

## 2023-10-02 DIAGNOSIS — R1030 Lower abdominal pain, unspecified: Secondary | ICD-10-CM | POA: Diagnosis not present

## 2023-10-02 DIAGNOSIS — K529 Noninfective gastroenteritis and colitis, unspecified: Secondary | ICD-10-CM

## 2023-10-02 DIAGNOSIS — R152 Fecal urgency: Secondary | ICD-10-CM

## 2023-10-02 DIAGNOSIS — R1013 Epigastric pain: Secondary | ICD-10-CM

## 2023-10-02 DIAGNOSIS — R140 Abdominal distension (gaseous): Secondary | ICD-10-CM | POA: Diagnosis not present

## 2023-10-02 MED ORDER — DICYCLOMINE HCL 10 MG PO CAPS
10.0000 mg | ORAL_CAPSULE | Freq: Three times a day (TID) | ORAL | 2 refills | Status: DC
Start: 2023-10-02 — End: 2024-01-28

## 2023-10-02 NOTE — Progress Notes (Signed)
Subjective:    Patient ID: Rodney [name], male    DOB: [birth year], 37 y.o.   MRN: [id number]  Rodney [name] is a 37 y.o. male presenting on 10/02/2023 for Abdominal Pain (X one month, has also felt lumps beneath skin on abdomen)   HPI  Discussed the use of AI scribe software for clinical note transcription with the patient, who gave verbal consent to proceed.     The patient, with a history of abdominal discomfort, presented with a recurrence of symptoms. The patient reported a shift in the location of discomfort from the right upper quadrant to the lower abdomen. The pain, described as sharp, typically occurs postprandially after meals and is often followed by an urgent need to defecate. The patient also reported occasional nausea and loose, liquid stools, which seem to alleviate the pain. Worse with fatty meals often.   The frequency of these episodes has fluctuated, with a recent decrease in occurrence. The patient has previously undergone imaging studies, including an abdominal X-ray and a CT scan, which revealed some bladder thickening but no significant abnormalities.  The patient has previously been prescribed Dicyclomine, which provided some relief from the bowel cramping, urgency, and loose stools. The patient also reported a significant reduction in alcohol consumption from 10-12 drinks per week to 1-2 drinks per week, primarily beer. This reduction occurred approximately two months ago, around the same time the patient noticed an increase in abdominal symptoms.  The patient has not been able to identify any specific trigger foods, but noted an increase in the consumption of fried foods. The patient also reported the presence of small, knot-like lumps in the area of discomfort, which are a new development. The patient denied any presence of blood in the stool.  Previously referred to GI in 2022 but unable to follow through with referral.      Additionally, He feels lumps or small  knots under skin mid abdomen. Non tender.     10/02/2023    3:39 PM 09/04/2023    9:14 AM 08/07/2023    4:44 PM  Depression screen PHQ 2/9  Decreased Interest 1 0   Down, Depressed, Hopeless 0 0   PHQ - 2 Score 1 0   Altered sleeping 0 0   Tired, decreased energy 0 1   Change in appetite 0 0   Feeling bad or failure about yourself  0 0   Trouble concentrating 0 0   Moving slowly or fidgety/restless 0 0   Suicidal thoughts 0 0   PHQ-9 Score 1 1   Difficult doing work/chores Not difficult at all Not difficult at all      Information is confidential and restricted. Go to Review Flowsheets to unlock data.    Social History   Tobacco Use   Smoking status: Every Day    Current packs/day: 1.50    Average packs/day: 1.5 packs/day for 20.0 years (30.0 ttl pk-yrs)    Types: Cigarettes   Smokeless tobacco: Never   Tobacco comments:    1.5-2 ppd, not trying to quit.  09/05/22 hfb  Vaping Use   Vaping status: Former  Substance Use Topics   Alcohol use: Yes    Alcohol/week: 2.0 standard drinks of alcohol    Types: 2 Cans of beer per week    Comment: weekly   Drug use: Not Currently    Types: Marijuana, Other-see comments, LSD    Comment: in past    Review of Systems Per HPI unless  specifically indicated above     Objective:    BP 114/76   Pulse (!) 57   Ht 6\' 5"  (1.956 m)   Wt 213 lb (96.6 kg)   SpO2 100%   BMI 25.26 kg/m   Wt Readings from Last 3 Encounters:  10/02/23 213 lb (96.6 kg)  09/04/23 218 lb 3.2 oz (99 kg)  04/18/23 223 lb 9.6 oz (101.4 kg)    Physical Exam Vitals and nursing note reviewed.  Constitutional:      General: He is not in acute distress.    Appearance: He is well-developed. He is not diaphoretic.     Comments: Well-appearing, comfortable, cooperative  HENT:     Head: Normocephalic and atraumatic.  Eyes:     General:        Right eye: No discharge.        Left eye: No discharge.     Conjunctiva/sclera: Conjunctivae normal.  Neck:      Thyroid: No thyromegaly.  Cardiovascular:     Rate and Rhythm: Normal rate and regular rhythm.     Pulses: Normal pulses.     Heart sounds: Normal heart sounds. No murmur heard. Pulmonary:     Effort: Pulmonary effort is normal. No respiratory distress.     Breath sounds: Normal breath sounds. No wheezing or rales.  Abdominal:     General: There is no distension.     Palpations: Abdomen is soft. There is no mass.     Tenderness: There is no abdominal tenderness. There is no guarding or rebound.     Hernia: No hernia is present.     Comments: Reduced bowel sounds. Negative RUQ pain and negative Murphy sign. Negative LUQ pain.  Musculoskeletal:        General: Normal range of motion.     Cervical back: Normal range of motion and neck supple.  Lymphadenopathy:     Cervical: No cervical adenopathy.  Skin:    General: Skin is warm and dry.     Findings: Lesion (nodularity palpated in lower anterior abdomen pelvic area appears subcutaneous tissue. does not seem consistent with actual nodules or mass.) present. No erythema or rash.  Neurological:     Mental Status: He is alert and oriented to person, place, and time. Mental status is at baseline.  Psychiatric:        Behavior: Behavior normal.     Comments: Well groomed, good eye contact, normal speech and thoughts     I have personally reviewed the radiology report from 08/06/20 on CT Renal Study.  CLINICAL DATA:  No hx surg to a/p. No hx kidney stones. Intermittent left flank pain x 3 weeks. Denies n/v/d or gross hematuria.   EXAM: CT ABDOMEN AND PELVIS WITHOUT CONTRAST   TECHNIQUE: Multidetector CT imaging of the abdomen and pelvis was performed following the standard protocol without IV contrast.   COMPARISON:  CT abdomen pelvis 08/23/2007   FINDINGS: Lower chest: No acute abnormality.   Somewhat limited evaluation of the abdominal viscera given the lack of IV contrast.   Hepatobiliary: No focal liver abnormality is  seen. No gallstones, gallbladder wall thickening, or biliary dilatation.   Pancreas: Unremarkable.  No surrounding fat stranding.   Spleen: Normal in size without focal abnormality.   Adrenals/Urinary Tract: Adrenal glands are unremarkable. The kidneys are symmetric in size. No renal calculi identified. No hydronephrosis. There is diffuse bladder wall thickening.   Stomach/Bowel: Stomach is within normal limits. Appendix appears normal. No evidence  of bowel wall thickening, distention, or inflammatory changes.   Vascular/Lymphatic: No significant vascular findings are present. No enlarged abdominal or pelvic lymph nodes.   Reproductive: Prostate is unremarkable.   Other: No abdominal wall hernia or abnormality. No abdominopelvic ascites.   Musculoskeletal: No acute or significant osseous findings.   IMPRESSION: 1. Diffuse bladder wall thickening, which may represent cystitis. Recommend correlation with urinalysis. 2. No renal calculi or hydronephrosis.     Electronically Signed   By: Emmaline Kluver M.D.   On: 08/06/2020 14:37   Results for orders placed or performed in visit on 09/04/23  Comp Met (CMET)  Result Value Ref Range   Glucose 81 70 - 99 mg/dL   BUN 3 (L) 6 - 20 mg/dL   Creatinine, Ser 0.98 (L) 0.76 - 1.27 mg/dL   eGFR 119 >14 NW/GNF/6.21   BUN/Creatinine Ratio 4 (L) 9 - 20   Sodium 136 134 - 144 mmol/L   Potassium 4.6 3.5 - 5.2 mmol/L   Chloride 98 96 - 106 mmol/L   CO2 25 20 - 29 mmol/L   Calcium 9.7 8.7 - 10.2 mg/dL   Total Protein 6.8 6.0 - 8.5 g/dL   Albumin 4.5 4.1 - 5.1 g/dL   Globulin, Total 2.3 1.5 - 4.5 g/dL   Bilirubin Total 0.3 0.0 - 1.2 mg/dL   Alkaline Phosphatase 48 44 - 121 IU/L   AST 15 0 - 40 IU/L   ALT 19 0 - 44 IU/L  CBC w/Diff  Result Value Ref Range   WBC 6.4 3.4 - 10.8 x10E3/uL   RBC 4.48 4.14 - 5.80 x10E6/uL   Hemoglobin 14.9 13.0 - 17.7 g/dL   Hematocrit 30.8 65.7 - 51.0 %   MCV 99 (H) 79 - 97 fL   MCH 33.3 (H) 26.6  - 33.0 pg   MCHC 33.5 31.5 - 35.7 g/dL   RDW 84.6 96.2 - 95.2 %   Platelets 352 150 - 450 x10E3/uL   Neutrophils 43 Not Estab. %   Lymphs 43 Not Estab. %   Monocytes 10 Not Estab. %   Eos 3 Not Estab. %   Basos 1 Not Estab. %   Neutrophils Absolute 2.7 1.4 - 7.0 x10E3/uL   Lymphocytes Absolute 2.8 0.7 - 3.1 x10E3/uL   Monocytes Absolute 0.7 0.1 - 0.9 x10E3/uL   EOS (ABSOLUTE) 0.2 0.0 - 0.4 x10E3/uL   Basophils Absolute 0.1 0.0 - 0.2 x10E3/uL   Immature Granulocytes 0 Not Estab. %   Immature Grans (Abs) 0.0 0.0 - 0.1 x10E3/uL  Lipase  Result Value Ref Range   Lipase 28 13 - 78 U/L  Amylase  Result Value Ref Range   Amylase 39 31 - 110 U/L      Assessment & Plan:   Problem List Items Addressed This Visit   None Visit Diagnoses     Postprandial diarrhea    -  Primary   Relevant Medications   dicyclomine (BENTYL) 10 MG capsule   Other Relevant Orders   US Abdomen Limited RUQ (LIVER/GB)   Ambulatory referral to Gastroenterology   Lower abdominal pain       Relevant Orders   Ambulatory referral to Gastroenterology   Postprandial abdominal bloating       Relevant Medications   dicyclomine (BENTYL) 10 MG capsule   Other Relevant Orders   US Abdomen Limited RUQ (LIVER/GB)   Ambulatory referral to Gastroenterology   Fecal urgency       Relevant Orders   Ambulatory referral  to Gastroenterology   Need for influenza vaccination       Relevant Orders   Flu vaccine trivalent PF, 6mos and older(Flulaval,Afluria,Fluarix,Fluzone) (Completed)   Epigastric abdominal pain       Relevant Orders   US Abdomen Limited RUQ (LIVER/GB)   Ambulatory referral to Gastroenterology       Assessment and Plan    Abdominal Pain with Bowel Urgency and Loose Stools Constellation of functional GI symptoms chronic issues 2+ years  Recurrent abdominal pain, particularly postprandial, associated with bowel urgency and loose stools. Previous imaging showed bladder thickening but no kidney stones.  Previous relief with Dicyclomine. Recent development of small subcutaneous nodules in the area of pain.  Consider multiple etiology, suspect some component of IBS contributing, his alcohol history was suggestive of gastritis or pancreatitis previously. These have been evaluated and less likely with now dramatic reduced alcohol for months and prior lipase work up negative  No definitive gallbladder imaging and symptoms seem worse with fatty fried foods  Reorder Dicyclomine with meals up to 3 times a day as needed. -Try over-the-counter peppermint oil (IB Guard) up to 3 times a day as needed.  Order right upper quadrant abdominal ultrasound to evaluate liver and gallbladder.  -Refer to GI specialist for further evaluation, including potential colonoscopy.  General Health Maintenance -Administer influenza vaccine today.       Orders Placed This Encounter  Procedures   US Abdomen Limited RUQ (LIVER/GB)    Standing Status:   Future    Standing Expiration Date:   10/01/2024    Order Specific Question:   Reason for Exam (SYMPTOM  OR DIAGNOSIS REQUIRED)    Answer:   RUQ and epigastric abdominal pain postprandial, diarrhea    Order Specific Question:   Preferred imaging location?    Answer:   [facility]   Flu vaccine trivalent PF, 6mos and older(Flulaval,Afluria,Fluarix,Fluzone)   Ambulatory referral to Gastroenterology    Referral Priority:   Routine    Referral Type:   Consultation    Referral Reason:   Specialty Services Required    Number of Visits Requested:   1     Meds ordered this encounter  Medications   dicyclomine (BENTYL) 10 MG capsule    Sig: Take 1 capsule (10 mg total) by mouth 4 (four) times daily -  before meals and at bedtime.    Dispense:  30 capsule    Refill:  2      Follow up plan: Return if symptoms worsen or fail to improve.   Saralyn Pilar, DO [facility] 10/02/2023, 3:50 PM

## 2023-10-02 NOTE — Patient Instructions (Addendum)
Thank you for coming to the office today.  Try both  IBGard OTC Peppermint Oil (Triple Coated Capsule) 180mg  take one 3 times daily to reduce diarrhea  Dicyclomine Rx with meals 2-3 times per day, as needed it can help solve or prevent after meal pain bloating cramping urgency diarrhea.  Ultrasound of Liver / Gallbladder to be scheduled soon, they will call you.  Referral to GI specialist for further work up and evaluation. They can help [place] evaluate the colon and may warrant a colonoscopy or other testing.  [facility]) [address] - [address] [place], [place] [address] Phone: [phone]  Please schedule a Follow-up Appointment to: Return if symptoms worsen or fail to improve.  If you have any other questions or concerns, please feel free to call the office or send a message through MyChart. You may also schedule an earlier appointment if necessary.  Additionally, you may be receiving a survey about your experience at our office within a few days to 1 week by e-mail or mail. We value your feedback.  Saralyn Pilar, DO [facility], [place]

## 2023-10-09 ENCOUNTER — Ambulatory Visit
Admission: RE | Admit: 2023-10-09 | Discharge: 2023-10-09 | Disposition: A | Discharge status: Home / Self Care | Payer: Medicare Other | Source: Ambulatory Visit | Attending: Family Medicine | Admitting: Family Medicine

## 2023-10-09 DIAGNOSIS — R1013 Epigastric pain: Secondary | ICD-10-CM | POA: Insufficient documentation

## 2023-10-09 DIAGNOSIS — R140 Abdominal distension (gaseous): Secondary | ICD-10-CM | POA: Diagnosis not present

## 2023-10-09 DIAGNOSIS — R1011 Right upper quadrant pain: Secondary | ICD-10-CM | POA: Diagnosis not present

## 2023-10-09 DIAGNOSIS — K529 Noninfective gastroenteritis and colitis, unspecified: Secondary | ICD-10-CM | POA: Diagnosis not present

## 2023-10-10 ENCOUNTER — Encounter: Payer: Self-pay | Admitting: Psychiatry

## 2023-10-10 ENCOUNTER — Ambulatory Visit (INDEPENDENT_AMBULATORY_CARE_PROVIDER_SITE_OTHER): Payer: Medicare Other | Admitting: Psychiatry

## 2023-10-10 VITALS — BP 131/84 | HR 64 | Ht 77.0 in | Wt 214.0 lb

## 2023-10-10 DIAGNOSIS — F1010 Alcohol abuse, uncomplicated: Secondary | ICD-10-CM

## 2023-10-10 DIAGNOSIS — F17200 Nicotine dependence, unspecified, uncomplicated: Secondary | ICD-10-CM | POA: Diagnosis not present

## 2023-10-10 DIAGNOSIS — F200 Paranoid schizophrenia: Secondary | ICD-10-CM | POA: Diagnosis not present

## 2023-10-10 DIAGNOSIS — F411 Generalized anxiety disorder: Secondary | ICD-10-CM

## 2023-10-10 MED ORDER — RISPERIDONE 0.25 MG PO TABS: 0.5000 mg | ORAL_TABLET | Freq: Every day | ORAL | Status: DC

## 2023-10-10 NOTE — Progress Notes (Unsigned)
BH MD OP Progress Note  10/11/2023 5:29 PM Rodney [name]  MRN:  [id number]  Chief Complaint:  Chief Complaint  Patient presents with   Follow-up   Anxiety   Depression   schiophrenia   Medication Refill   HPI: Rodney [name] is a 37 year old Caucasian male, employed, lives in [place], has a history of schizophrenia, GAD, alcohol abuse, cannabis abuse in remission, tobacco use disorder was evaluated in office today.  Patient today reports he continues to have anxiety symptoms however that has been ongoing since the past couple months.  Usually situational due to work-related stressors, financial situation and so on.  Patient currently also has GI problems, reports abdominal pain as well as diarrhea.  Patient reports he had an ultrasound done by his primary care provider.  They are currently discussing GI specialist referral .  Patient otherwise denies any significant depression symptoms.  Denies any hallucinations.  Denies any paranoia.  Reports sleep is overall okay.  Patient reports he is currently taking the lower dosage of risperidone, reduced it to a 0.5 milligram at bedtime.  He does not believe that has caused any worsening of his mood symptoms and he would like to stay on this dose for now.  Patient denies any significant traumas, abnormal involuntary movements.  Does have Cogentin available however has not been using it much.  Patient denies any suicidality or homicidality.  Patient continues to smoke cigarettes, reports he is smoking a lot.  He is motivated to start cutting back.  Patient reports episodes of social drinking of alcohol especially when he plays golf when he may drink a few more drinks.  Otherwise denies it on a regular basis.  Patient denies any other substance use.  Patient reports he is currently looking into finding a new job.  He is interested in being a CDL driver.  He is also looking into working at the [employer] which is close to him.    Visit  Diagnosis:    ICD-10-CM   1. Paranoid schizophrenia (HCC)  F20.0 risperiDONE (RISPERDAL) 0.25 MG tablet    2. GAD (generalized anxiety disorder)  F41.1     3. Alcohol use disorder, mild, abuse  F10.10    in partial remission    4. Tobacco use disorder  F17.200       Past Psychiatric History: I have reviewed past psychiatric history from progress note on 09/23/2018.  Past Medical History:  Past Medical History:  Diagnosis Date   Anxiety    Bipolar disorder (HCC)    CIGARETTE SMOKER 02/08/2011   Qualifier: Diagnosis of  By: Laural Benes MD, [place]     Seizures [facility])     Past Surgical History:  Procedure Laterality Date   WISDOM TOOTH EXTRACTION      Family Psychiatric History: Reviewed family psychiatric history from progress note on 09/23/2018.  Family History:  Family History  Problem Relation Age of Onset   Anxiety disorder Mother    OCD Brother    Anxiety disorder Maternal Aunt    Anxiety disorder Maternal Grandmother    Anxiety disorder Paternal Grandfather     Social History: Reviewed social history from progress note on 09/23/2018. Social History   Socioeconomic History   Marital status: Significant Other    Spouse name: Not on file   Number of children: 1   Years of education: Not on file   Highest education level: Associate degree: occupational, Scientist, product/process development, or vocational program  Occupational History   Occupation: Location manager  Comment: [place]    Occupation: Shipping and receiving  Tobacco Use   Smoking status: Every Day    Current packs/day: 1.50    Average packs/day: 1.5 packs/day for 20.0 years (30.0 ttl pk-yrs)    Types: Cigarettes   Smokeless tobacco: Never   Tobacco comments:    1.5-2 ppd, not trying to quit.  09/05/22 hfb  Vaping Use   Vaping status: Former  Substance and Sexual Activity   Alcohol use: Yes    Alcohol/week: 2.0 standard drinks of alcohol    Types: 2 Cans of beer per week    Comment: weekly   Drug use: Not Currently     Types: Marijuana, Other-see comments, LSD    Comment: in past   Sexual activity: Yes  Other Topics Concern   Not on file  Social History Narrative   Not on file   Social Determinants of Health   Financial Resource Strain: Medium Risk (10/16/2022)   Overall Financial Resource Strain (CARDIA)    Difficulty of Paying Living Expenses: Somewhat hard  Food Insecurity: No Food Insecurity (10/16/2022)   Hunger Vital Sign    Worried About Running Out of Food in the Last Year: Never true    Ran Out of Food in the Last Year: Never true  Transportation Needs: No Transportation Needs (10/16/2022)   PRAPARE - Administrator, Civil Service (Medical): No    Lack of Transportation (Non-Medical): No  Physical Activity: Inactive (10/16/2022)   Exercise Vital Sign    Days of Exercise per Week: 0 days    Minutes of Exercise per Session: 0 min  Stress: No Stress Concern Present (10/16/2022)   [employer] of Occupational Health - Occupational Stress Questionnaire    Feeling of Stress : Not at all  Social Connections: Moderately Isolated (10/16/2022)   Social Connection and Isolation Panel [NHANES]    Frequency of Communication with Friends and Family: More than three times a week    Frequency of Social Gatherings with Friends and Family: More than three times a week    Attends Religious Services: Never    Database administrator or Organizations: No    Attends Banker Meetings: Never    Marital Status: Living with partner    Allergies:  Allergies  Allergen Reactions   Hydroxyzine Hcl Rash    Metabolic Disorder Labs: Lab Results  Component Value Date   HGBA1C 5.3 06/05/2023   MPG 105.41 06/05/2023   MPG 105 03/04/2020   Lab Results  Component Value Date   PROLACTIN 11.4 06/05/2023   Lab Results  Component Value Date   CHOL 187 06/05/2023   TRIG 101 06/05/2023   HDL 65 06/05/2023   CHOLHDL 2.9 06/05/2023   VLDL 20 06/05/2023   LDLCALC 102 (H)  06/05/2023   LDLCALC 84 03/04/2020   Lab Results  Component Value Date   TSH 0.850 06/05/2023   TSH 3.543 05/24/2022    Therapeutic Level Labs: No results found for: "LITHIUM" Lab Results  Component Value Date   VALPROATE 64 06/05/2023   VALPROATE 61 05/24/2022   No results found for: "CBMZ"  Current Medications: Current Outpatient Medications  Medication Sig Dispense Refill   albuterol (PROAIR HFA) 108 (90 Base) MCG/ACT inhaler Inhale 2 puffs into the lungs every 6 (six) hours as needed for wheezing or shortness of breath. 8.5 each 2   benztropine (COGENTIN) 1 MG tablet TAKE 1 TABLET (1 MG TOTAL) BY MOUTH 2 (TWO) TIMES DAILY. FOR  SIDE EFFECTS OF RISPERIDONE 180 tablet 0   dicyclomine (BENTYL) 10 MG capsule Take 1 capsule (10 mg total) by mouth 4 (four) times daily -  before meals and at bedtime. 30 capsule 2   divalproex (DEPAKOTE ER) 250 MG 24 hr tablet Take 1 tablet (250 mg total) by mouth daily. Take along with 500 mg , total dose of 750 mg daily 90 tablet 0   divalproex (DEPAKOTE ER) 500 MG 24 hr tablet TAKE 1 TABLET (500 MG TOTAL) BY MOUTH DAILY. TAKE ALONG WITH 250 MG DAILY 90 tablet 0   gabapentin (NEURONTIN) 600 MG tablet TAKE 1 TABLET BY MOUTH THREE TIMES A DAY 270 tablet 0   TRELEGY ELLIPTA 200-62.5-25 MCG/ACT AEPB INHALE 1 PUFF BY MOUTH EVERY DAY 60 each 2   triamcinolone (NASACORT) 55 MCG/ACT AERO nasal inhaler PLACE 1 SPRAY INTO THE NOSE 2 (TWO) TIMES DAILY. 16.9 each 12   risperiDONE (RISPERDAL) 0.25 MG tablet Take 2 tablets (0.5 mg total) by mouth at bedtime. Has supplies for now     No current facility-administered medications for this visit.     Musculoskeletal: Strength & Muscle Tone: within normal limits Gait & Station: normal Patient leans: N/A  Psychiatric Specialty Exam: Review of Systems  Gastrointestinal:  Positive for diarrhea.  Psychiatric/Behavioral:  The patient is nervous/anxious.     Blood pressure 131/84, pulse 64, height 6\' 5"  (1.956 m),  weight 214 lb (97.1 kg).Body mass index is 25.38 kg/m.  General Appearance: Casual  Eye Contact:  Good  Speech:  Clear and Coherent  Volume:  Normal  Mood:  Anxious  Affect:  Appropriate  Thought Process:  Goal Directed and Descriptions of Associations: Intact  Orientation:  Full (Time, Place, and Person)  Thought Content: Logical   Suicidal Thoughts:  No  Homicidal Thoughts:  No  Memory:  Immediate;   Fair Recent;   Fair Remote;   Fair  Judgement:  Fair  Insight:  Fair  Psychomotor Activity:  Normal  Concentration:  Concentration: Fair and Attention Span: Fair  Recall:  [employer] of Knowledge: Fair  Language: Fair  Akathisia:  No  Handed:  Right  AIMS (if indicated):  done  Assets:  Communication Skills Desire for Improvement Housing Intimacy Resilience Social Support Talents/Skills  ADL's:  Intact  Cognition: WNL  Sleep:  Fair   Screenings: Geneticist, molecular Office Visit from 10/10/2023 in [facility] Office Visit from 08/07/2023 in [facility] Office Visit from 02/07/2023 in [facility] Video Visit from 10/05/2022 in [facility] Video Visit from 08/01/2022 in [facility]  AIMS Total Score 0 0 0 0 0      AUDIT    Flowsheet Row Video Visit from 08/01/2022 in [facility]  Alcohol Use Disorder Identification Test Final Score (AUDIT) 15      GAD-7    Flowsheet Row Office Visit from 10/10/2023 in [facility] Office Visit from 10/02/2023 in [facility] Office Visit from 09/04/2023 in [facility] Office Visit from 08/07/2023 in [facility] Office Visit from 06/06/2023 in [facility]  Total GAD-7 Score 9 10 11 5 5       PHQ2-9    Flowsheet Row Office Visit from 10/10/2023 in [facility] Office Visit from 10/02/2023 in [facility] Office Visit from 09/04/2023 in [facility] Office Visit from 08/07/2023 in [facility] Office Visit from 06/06/2023 in [facility]  PHQ-2 Total Score 0 1 0 0 0  PHQ-9 Total Score -- 1 1 0 --      Flowsheet Row Office Visit from 10/10/2023 in [facility] Office Visit from 08/07/2023 in [facility] Office Visit from 06/06/2023 in [facility]  C-SSRS RISK CATEGORY No Risk No Risk No Risk        Assessment and Plan: Rodney [name] is a 37 year old Caucasian male, lives in [place], has a history of schizophrenia, alcoholism, cannabis abuse in remission, anxiety disorder, tobacco use disorder was evaluated in office today.  Patient is currently tolerating the lower dosage of risperidone however continues to have anxiety symptoms likely situational, will benefit from psychotherapy sessions, will refer for the same.  Plan as noted below.  Plan Schizophrenia-stable Depakote ER 750 mg p.o. daily Risperidone 0.75 mg p.o. nightly Patient tolerating dosage reduction. Depakote level-06/05/2023-64-therapeutic  GAD-unstable Gabapentin 600 mg p.o. 3 times daily We will consider adding an SSRI or SNRI in the future however patient with GI problems will recommend getting evaluated for that first prior to addition of a new medication since these medications could also cause GI side effects. Provide referral to CBT-provided resources in the community.  Alcohol use disorder mild in partial remission Patient to continue to stay away and cut  back  Tobacco use disorder-unstable Patient provided counseling for 1 minute.   Follow-up in clinic in 8 weeks or sooner if needed.   Consent: Patient/Guardian gives verbal consent for treatment and assignment of benefits for services provided during this visit. Patient/Guardian expressed understanding and agreed to proceed.   This note was generated in part or whole with voice recognition software. Voice recognition is usually quite accurate but there are transcription errors that can and very often do occur. I apologize for any typographical errors that were not detected and corrected.    Jomarie Longs, MD 10/11/2023, 5:29 PM

## 2023-10-10 NOTE — Patient Instructions (Signed)
  [email or web address]  www.[email or web address]  [email or web address] [facility] [address], [place], [place] [address]   [phone]  [facility] [email or web address] [address] [place], [place] [address]  [phone]  [facility] [email or web address] [address], [place], [place] [address]  [phone]   [facility] - [phone]  [facility] - [phone]  [facility] - [phone] counseling [phone]  [facility] - [phone]    [facility] has interns who offer sliding scale rates and some of the full time clinicians do, as well. You complete their contact form on their website and the referrals coordinator will help to get connected to someone   Medicaid below :  [facility] [address] [place], [place] [address]  [phone]    [employer] [address] [place], [place] [address]  [phone]    [facility] [address] [place], [place] [address]  [phone]

## 2023-10-20 ENCOUNTER — Other Ambulatory Visit: Payer: Self-pay | Admitting: Family Medicine

## 2023-10-20 DIAGNOSIS — F411 Generalized anxiety disorder: Secondary | ICD-10-CM

## 2023-10-20 DIAGNOSIS — F1020 Alcohol dependence, uncomplicated: Secondary | ICD-10-CM

## 2023-10-22 ENCOUNTER — Telehealth: Payer: Self-pay | Admitting: Family Medicine

## 2023-10-22 NOTE — Telephone Encounter (Signed)
Called LVM 10/22/19 24 to schedule Annual Wellness Visit  Rodney [name]; Care Guide Ambulatory Clinical Support [place] [facility] Direct Dial: [phone]

## 2023-10-22 NOTE — Telephone Encounter (Signed)
Requested Prescriptions  Pending Prescriptions Disp Refills   gabapentin (NEURONTIN) 600 MG tablet [Pharmacy Med Name: GABAPENTIN 600 MG TABLET] 270 tablet 0    Sig: TAKE 1 TABLET BY MOUTH THREE TIMES A DAY     Neurology: Anticonvulsants - gabapentin Failed - 10/20/2023 11:08 AM      Failed - Cr in normal range and within 360 days    Creat  Date Value Ref Range Status  07/21/2020 0.81 0.60 - 1.35 mg/dL Final   Creatinine, Ser  Date Value Ref Range Status  09/04/2023 0.73 (L) 0.76 - 1.27 mg/dL Final         Passed - Completed PHQ-2 or PHQ-9 in the last 360 days      Passed - Valid encounter within last 12 months    Recent Outpatient Visits           2 weeks ago Postprandial diarrhea   [place] [facility] Smitty Cords, DO   1 month ago Generalized abdominal pain   [place] [facility] Mecum, Oswaldo Conroy, PA-C   1 year ago Pleurisy   [place] [facility] Smitty Cords, DO   1 year ago Paranoid schizophrenia [facility])   [place] [facility] Smitty Cords, DO   1 year ago Acute viral syndrome   [place] [facility] [place], Netta Neat, [place]

## 2023-11-09 ENCOUNTER — Ambulatory Visit: Payer: Medicare Other | Admitting: Physician Assistant

## 2023-11-15 ENCOUNTER — Telehealth: Payer: Self-pay | Admitting: Psychiatry

## 2023-11-15 NOTE — Telephone Encounter (Signed)
Attempted to contact patient to discuss the form received from [employer] -for a fitness for duty evaluation.  Had to leave a voicemail.

## 2023-11-19 ENCOUNTER — Telehealth: Payer: Self-pay | Admitting: Psychiatry

## 2023-11-19 ENCOUNTER — Ambulatory Visit
Admission: EM | Admit: 2023-11-19 | Discharge: 2023-11-19 | Disposition: A | Discharge status: Home / Self Care | Payer: Medicare Other | Attending: Family Medicine | Admitting: Family Medicine

## 2023-11-19 ENCOUNTER — Encounter: Payer: Self-pay | Admitting: Psychiatry

## 2023-11-19 DIAGNOSIS — S61210A Laceration without foreign body of right index finger without damage to nail, initial encounter: Secondary | ICD-10-CM

## 2023-11-19 NOTE — ED Triage Notes (Signed)
Patient to Urgent Care with complaints of laceration present to right index finger. Reports he cut his finger on the hood of his care.  Injury occurred this afternoon. Bleeding well controlled with dressing in place.   TDAP: 07/19/20

## 2023-11-19 NOTE — Telephone Encounter (Signed)
Noted  

## 2023-11-19 NOTE — ED Provider Notes (Signed)
Renaldo Fiddler    [name]: [id number] Arrival date & time: 11/19/23  1741      History   Chief Complaint Chief Complaint  Patient presents with   Laceration    HPI Rodney [name] is a 37 y.o. male.    Laceration    Past Medical History:  Diagnosis Date   Anxiety    Bipolar disorder (HCC)    CIGARETTE SMOKER 02/08/2011   Qualifier: Diagnosis of  By: Laural Benes MD, [place]     Seizures [facility])     Patient Active Problem List   Diagnosis Date Noted   Memory loss 05/23/2022   High risk medication use 05/23/2022   Bladder wall thickening 08/06/2020   GAD (generalized anxiety disorder) 09/02/2019   Cannabis use disorder, mild, in sustained remission 09/02/2019   Alcohol use disorder, moderate, dependence (HCC) 02/18/2019   Asthma with COPD (HCC) 09/26/2018   Paranoid schizophrenia (HCC) 02/05/2018   Vitamin D deficiency 08/09/2015   Reactive airway disease with status asthmaticus 08/09/2015   Cigarette nicotine dependence with nicotine-induced disorder 09/14/2014   Alcohol use disorder, mild, abuse 02/08/2011   Tobacco use disorder 02/08/2011   Allergic rhinitis 02/08/2011   SCHIZOPHRENIA, HX OF 02/08/2011    Past Surgical History:  Procedure Laterality Date   WISDOM TOOTH EXTRACTION         Home Medications    Prior to Admission medications   Medication Sig Start Date End Date Taking? Authorizing Provider  albuterol (PROAIR HFA) 108 (90 Base) MCG/ACT inhaler Inhale 2 puffs into the lungs every 6 (six) hours as needed for wheezing or shortness of breath. 05/04/22   Salena Saner, MD  benztropine (COGENTIN) 1 MG tablet TAKE 1 TABLET (1 MG TOTAL) BY MOUTH 2 (TWO) TIMES DAILY. FOR SIDE EFFECTS OF RISPERIDONE 09/25/23   Jomarie Longs, MD  dicyclomine (BENTYL) 10 MG capsule Take 1 capsule (10 mg total) by mouth 4 (four) times daily -  before meals and at bedtime. 10/02/23   Karamalegos, Netta Neat, DO  divalproex (DEPAKOTE ER) 250 MG 24 hr tablet Take 1 tablet  (250 mg total) by mouth daily. Take along with 500 mg , total dose of 750 mg daily 08/31/23   Jomarie Longs, MD  divalproex (DEPAKOTE ER) 500 MG 24 hr tablet TAKE 1 TABLET (500 MG TOTAL) BY MOUTH DAILY. TAKE ALONG WITH 250 MG DAILY 08/29/23   Jomarie Longs, MD  gabapentin (NEURONTIN) 600 MG tablet TAKE 1 TABLET BY MOUTH THREE TIMES A DAY 10/22/23   Karamalegos, Netta Neat, DO  risperiDONE (RISPERDAL) 0.25 MG tablet Take 2 tablets (0.5 mg total) by mouth at bedtime. Has supplies for now 10/10/23   Jomarie Longs, MD  Harrel Carina ELLIPTA 200-62.5-25 MCG/ACT AEPB INHALE 1 PUFF BY MOUTH EVERY DAY 09/05/23   Salena Saner, MD  triamcinolone (NASACORT) 55 MCG/ACT AERO nasal inhaler PLACE 1 SPRAY INTO THE NOSE 2 (TWO) TIMES DAILY. 04/17/22   Salena Saner, MD    Family History Family History  Problem Relation Age of Onset   Anxiety disorder Mother    OCD Brother    Anxiety disorder Maternal Aunt    Anxiety disorder Maternal Grandmother    Anxiety disorder Paternal Grandfather     Social History Social History   Tobacco Use   Smoking status: Every Day    Current packs/day: 1.50    Average packs/day: 1.5 packs/day for 20.0 years (30.0 ttl pk-yrs)    Types: Cigarettes   Smokeless tobacco: Never  Tobacco comments:    1.5-2 ppd, not trying to quit.  09/05/22 hfb  Vaping Use   Vaping status: Former  Substance Use Topics   Alcohol use: Yes    Alcohol/week: 2.0 standard drinks of alcohol    Types: 2 Cans of beer per week    Comment: weekly   Drug use: Not Currently    Types: Marijuana, Other-see comments, LSD    Comment: in past     Allergies   Hydroxyzine hcl   Review of Systems Review of Systems   Physical Exam Triage Vital Signs ED Triage Vitals  Encounter Vitals Group     BP 11/19/23 1823 (!) 140/82     Systolic BP Percentile --      Diastolic BP Percentile --      Pulse Rate 11/19/23 1823 68     Resp 11/19/23 1823 18     Temp 11/19/23 1823 97.8 F (36.6 C)      Temp src --      SpO2 11/19/23 1823 97 %     Weight --      Height --      Head Circumference --      Peak Flow --      Pain Score 11/19/23 1831 1     Pain Loc --      Pain Education --      Exclude from Growth Chart --    No data found.  Updated Vital Signs BP (!) 140/82   Pulse 68   Temp 97.8 F (36.6 C)   Resp 18   SpO2 97%   Visual Acuity Right Eye Distance:   Left Eye Distance:   Bilateral Distance:    Right Eye Near:   Left Eye Near:    Bilateral Near:     Physical Exam   UC Treatments / Results  Labs (all labs ordered are listed, but only abnormal results are displayed) Labs Reviewed - No data to display  EKG   Radiology No results found.  Procedures Laceration Repair  Date/Time: 11/19/2023 7:08 PM  Performed by: Bing Neighbors, NP Authorized by: Bing Neighbors, NP    (including critical care time)  Medications Ordered in UC Medications - No data to display  Initial Impression / Assessment and Plan / UC Course  I have reviewed the triage vital signs and the nursing notes.  Pertinent labs & imaging results that were available during my care of the patient were reviewed by me and considered in my medical decision making (see chart for details).     *** Final Clinical Impressions(s) / UC Diagnoses   Final diagnoses:  None   Discharge Instructions   None    ED Prescriptions   None    PDMP not reviewed this encounter.

## 2023-11-19 NOTE — Discharge Instructions (Addendum)
Tender to splint over the next 48 hours to prevent ending of index finger.  Transition to a bandage after 2 days.  Your laceration may take up to 7 to 14 days to completely heal however the bleeding should be well-controlled after 48 hours.  Avoid reinjury by wearing a bandage.

## 2023-11-19 NOTE — Progress Notes (Unsigned)
Celso Amy, PA-C [address]  [address]  [place], [place] [address]  Main: [phone]  Fax: [phone]   Gastroenterology Consultation  Referring Provider:     Saralyn Pilar * Primary Care Physician:  Smitty Cords, DO Primary Gastroenterologist:  Celso Amy, PA-C  Reason for Consultation:     Abdominal pain and diarrhea        HPI:   Rodney [name] is a 37 y.o. y/o male referred for consultation & management  by Smitty Cords, DO.  Patient states his main concern is left upper quadrant and left lower quadrant abdominal pain.  He has had worsening left-sided abdominal pain for several months.  Left side abdominal pain is getting worse.  He has occasional episode of diarrhea.  Denies constipation, rectal bleeding, nausea, vomiting, or weight loss.  No family history of colon cancer or IBD.  LUQ pain is sharp, shooting and throbbing.  He has history of GI symptoms for many years.  In the past he had intermittent episodes of lower abdominal pain and postprandial diarrhea & bloating.  Tried Bentyl 10 Mg 3 times daily with some benefit.  Lower abdominal cramping has improved.  10/09/2023 RUQ ultrasound: Normal.  09/04/23 Labs:  Normal CBC, CMP, Amylase & Lipase.  Hgb 14.9g.  Normal TSH.  No previous GI evaluation.  Past Medical History:  Diagnosis Date   Anxiety    Bipolar disorder (HCC)    CIGARETTE SMOKER 02/08/2011   Qualifier: Diagnosis of  By: Laural Benes MD, [place]     Seizures [facility])     Past Surgical History:  Procedure Laterality Date   WISDOM TOOTH EXTRACTION      Prior to Admission medications   Medication Sig Start Date End Date Taking? Authorizing Provider  albuterol (PROAIR HFA) 108 (90 Base) MCG/ACT inhaler Inhale 2 puffs into the lungs every 6 (six) hours as needed for wheezing or shortness of breath. 05/04/22   Salena Saner, MD  benztropine (COGENTIN) 1 MG tablet TAKE 1 TABLET (1 MG TOTAL) BY MOUTH 2 (TWO) TIMES  DAILY. FOR SIDE EFFECTS OF RISPERIDONE 09/25/23   Jomarie Longs, MD  dicyclomine (BENTYL) 10 MG capsule Take 1 capsule (10 mg total) by mouth 4 (four) times daily -  before meals and at bedtime. 10/02/23   Karamalegos, Netta Neat, DO  divalproex (DEPAKOTE ER) 250 MG 24 hr tablet Take 1 tablet (250 mg total) by mouth daily. Take along with 500 mg , total dose of 750 mg daily 08/31/23   Jomarie Longs, MD  divalproex (DEPAKOTE ER) 500 MG 24 hr tablet TAKE 1 TABLET (500 MG TOTAL) BY MOUTH DAILY. TAKE ALONG WITH 250 MG DAILY 08/29/23   Jomarie Longs, MD  gabapentin (NEURONTIN) 600 MG tablet TAKE 1 TABLET BY MOUTH THREE TIMES A DAY 10/22/23   Karamalegos, Netta Neat, DO  risperiDONE (RISPERDAL) 0.25 MG tablet Take 2 tablets (0.5 mg total) by mouth at bedtime. Has supplies for now 10/10/23   Jomarie Longs, MD  Harrel Carina ELLIPTA 200-62.5-25 MCG/ACT AEPB INHALE 1 PUFF BY MOUTH EVERY DAY 09/05/23   Salena Saner, MD  triamcinolone (NASACORT) 55 MCG/ACT AERO nasal inhaler PLACE 1 SPRAY INTO THE NOSE 2 (TWO) TIMES DAILY. 04/17/22   Salena Saner, MD    Family History  Problem Relation Age of Onset   Anxiety disorder Mother    OCD Brother    Anxiety disorder Maternal Aunt    Anxiety disorder Maternal Grandmother    Anxiety disorder  Paternal Grandfather      Social History   Tobacco Use   Smoking status: Every Day    Current packs/day: 1.50    Average packs/day: 1.5 packs/day for 20.0 years (30.0 ttl pk-yrs)    Types: Cigarettes   Smokeless tobacco: Never   Tobacco comments:    1.5-2 ppd, not trying to quit.  09/05/22 hfb  Vaping Use   Vaping status: Former  Substance Use Topics   Alcohol use: Yes    Alcohol/week: 2.0 standard drinks of alcohol    Types: 2 Cans of beer per week    Comment: weekly   Drug use: Not Currently    Types: Marijuana, Other-see comments, LSD    Comment: in past    Allergies as of 11/20/2023 - Review Complete 11/20/2023  Allergen Reaction Noted    Hydroxyzine hcl Rash 07/09/2013    Review of Systems:    All systems reviewed and negative except where noted in HPI.   Physical Exam:  BP 120/75   Pulse 72   Temp 98.1 F (36.7 C)   Ht 6\' 5"  (1.956 m)   Wt 211 lb 9.6 oz (96 kg)   BMI 25.09 kg/m  No LMP for male patient.  General:   Alert,  Well-developed, well-nourished, pleasant and cooperative in NAD Lungs:  Respirations even and unlabored.  Clear throughout to auscultation.   No wheezes, crackles, or rhonchi. No acute distress. Heart:  Regular rate and rhythm; no murmurs, clicks, rubs, or gallops. Abdomen:  Normal bowel sounds.  No bruits.  Soft, and non-distended without masses, hepatosplenomegaly or hernias noted.  Mild LUQ and LLQ Tenderness.  No Epigastric, Suprapubic or Right Sided Abdominal Tenderness.  No guarding or rebound tenderness.    Neurologic:  Alert and oriented x3;  grossly normal neurologically. Psych:  Alert and cooperative. Normal mood and affect.  Imaging Studies: No results found.  Assessment and Plan:   Rodney [name] is a 37 y.o. y/o male has been referred to evaluate LUQ and LLQ abdominal Pain.  I am ordering abdominal pelvic CT to evaluate for pancreatitis, diverticulitis, IBD.  He has chronic GI symptoms in the past which are suspicious for irritable bowel syndrome, diarrhea predominant.  He denies any alarm symptoms such as rectal bleeding, anemia, or weight loss.  LUQ Abdominal Pain LLQ Abdominal Pain  Plan: CT Abdomen / Pelvis With Contrast.  If CT is unrevealing, then adjust treatment for irritable bowel syndrome.  Follow up in 6 weeks with TG.  Celso Amy, PA-C

## 2023-11-19 NOTE — Telephone Encounter (Signed)
I have completed the occupational health form-printed out a letter for this patient.  Will have staff contact patient and send the form to occupational health.

## 2023-11-20 ENCOUNTER — Encounter: Payer: Self-pay | Admitting: Physician Assistant

## 2023-11-20 ENCOUNTER — Ambulatory Visit (INDEPENDENT_AMBULATORY_CARE_PROVIDER_SITE_OTHER): Payer: Medicare Other | Admitting: Physician Assistant

## 2023-11-20 VITALS — BP 120/75 | HR 72 | Temp 98.1°F | Ht 77.0 in | Wt 211.6 lb

## 2023-11-20 DIAGNOSIS — R1012 Left upper quadrant pain: Secondary | ICD-10-CM

## 2023-11-20 DIAGNOSIS — R1030 Lower abdominal pain, unspecified: Secondary | ICD-10-CM

## 2023-11-20 DIAGNOSIS — R1032 Left lower quadrant pain: Secondary | ICD-10-CM

## 2023-11-20 NOTE — Patient Instructions (Addendum)
CT scheduled 11-29-23 1:00 pm arrival. [facility] [address] [place] [address]

## 2023-11-23 ENCOUNTER — Other Ambulatory Visit: Payer: Self-pay | Admitting: Psychiatry

## 2023-11-23 DIAGNOSIS — F200 Paranoid schizophrenia: Secondary | ICD-10-CM

## 2023-11-24 ENCOUNTER — Other Ambulatory Visit: Payer: Self-pay | Admitting: Psychiatry

## 2023-11-24 DIAGNOSIS — F200 Paranoid schizophrenia: Secondary | ICD-10-CM

## 2023-11-24 DIAGNOSIS — F411 Generalized anxiety disorder: Secondary | ICD-10-CM

## 2023-11-29 ENCOUNTER — Ambulatory Visit
Admission: RE | Admit: 2023-11-29 | Discharge: 2023-11-29 | Disposition: A | Discharge status: Home / Self Care | Payer: Medicare Other | Source: Ambulatory Visit | Attending: Physician Assistant | Admitting: Physician Assistant

## 2023-11-29 DIAGNOSIS — R1032 Left lower quadrant pain: Secondary | ICD-10-CM | POA: Insufficient documentation

## 2023-11-29 DIAGNOSIS — R1030 Lower abdominal pain, unspecified: Secondary | ICD-10-CM | POA: Diagnosis not present

## 2023-11-29 DIAGNOSIS — R1012 Left upper quadrant pain: Secondary | ICD-10-CM | POA: Insufficient documentation

## 2023-11-29 DIAGNOSIS — R1031 Right lower quadrant pain: Secondary | ICD-10-CM | POA: Diagnosis not present

## 2023-11-29 DIAGNOSIS — N3289 Other specified disorders of bladder: Secondary | ICD-10-CM | POA: Diagnosis not present

## 2023-11-29 MED ORDER — IOHEXOL 300 MG/ML  SOLN
100.0000 mL | Freq: Once | INTRAMUSCULAR | Status: AC | PRN
  Administered 2023-11-29: 100 mL via INTRAVENOUS

## 2023-12-05 ENCOUNTER — Other Ambulatory Visit: Payer: Self-pay | Admitting: Pulmonary Disease

## 2023-12-06 ENCOUNTER — Encounter: Payer: Self-pay | Admitting: Pulmonary Disease

## 2023-12-06 ENCOUNTER — Ambulatory Visit: Payer: Medicare Other | Admitting: Pulmonary Disease

## 2023-12-06 VITALS — BP 120/82 | HR 69 | Temp 97.5°F | Ht 77.0 in | Wt 219.8 lb

## 2023-12-06 DIAGNOSIS — J302 Other seasonal allergic rhinitis: Secondary | ICD-10-CM

## 2023-12-06 DIAGNOSIS — J3089 Other allergic rhinitis: Secondary | ICD-10-CM

## 2023-12-06 DIAGNOSIS — J4489 Other specified chronic obstructive pulmonary disease: Secondary | ICD-10-CM

## 2023-12-06 DIAGNOSIS — R0602 Shortness of breath: Secondary | ICD-10-CM

## 2023-12-06 DIAGNOSIS — F17210 Nicotine dependence, cigarettes, uncomplicated: Secondary | ICD-10-CM

## 2023-12-06 LAB — NITRIC OXIDE: Nitric Oxide: 7

## 2023-12-06 MED ORDER — TRELEGY ELLIPTA 200-62.5-25 MCG/ACT IN AEPB: 1.0000 | INHALATION_SPRAY | Freq: Every day | RESPIRATORY_TRACT | 11 refills | Status: AC

## 2023-12-06 MED ORDER — ALBUTEROL SULFATE HFA 108 (90 BASE) MCG/ACT IN AERS: 2.0000 | INHALATION_SPRAY | Freq: Four times a day (QID) | RESPIRATORY_TRACT | 2 refills | Status: DC | PRN

## 2023-12-06 NOTE — Patient Instructions (Signed)
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 You reported that your symptoms have been stable and well-managed with your current medication, Trelegy. You also mentioned that you are considering reducing or quitting smoking due to a potential job change. Your last breathing test was in January, and you are due for another one in six months. You received your flu shot and have no new medications.  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 Your symptoms are well-managed with Trelegy, and you rarely need to use albuterol. You are encouraged to reduce or quit smoking, as it can significantly improve your lung health. Continue taking Trelegy as prescribed, and we will schedule your next pulmonary function test in six months.  -GENERAL HEALTH MAINTENANCE: You have received your annual flu vaccination, which is important for preventing influenza, especially with your respiratory condition. Please ensure you get this vaccination every year.  INSTRUCTIONS:  Please continue taking Trelegy as prescribed and try to reduce or quit smoking. Schedule your next pulmonary function test in six months. Follow up with [place] in six months unless your symptoms worsen.

## 2023-12-06 NOTE — Progress Notes (Signed)
Subjective:    Patient ID: Rodney [name], male    DOB: 08/05/1986, 37 y.o.   MRN: [id number]  Patient Care Team: Smitty Cords, DO as PCP - General [facility])  Chief Complaint  Patient presents with   Follow-up    No SOB. Occasional wheezing. Cough with clear sputum.    BACKGROUND/INTERVAL:[name] is a 37 year old current smoker (1.5 PPD) who presents for follow-up on the issue of asthma/COPD overlap. Patient continues to be compliant with Trelegy Ellipta 200.  Last visit here was 18 April 2023 with Rhunette Croft, NP.  At that time he was stable and instructed to continue Trelegy, as needed albuterol and nasal hygiene for his chronic/perennial rhinitis.  He has not had any exacerbations nor hospitalizations since his last visit.  HPI Discussed the use of AI scribe software for clinical note transcription with the patient, who gave verbal consent to proceed.  History of Present Illness   [name], a 37 year old with a history of asthma and COPD overlap, presents for a scheduled follow-up visit. He reports no significant changes in his condition since the last visit in April 2024. He has been on Trelegy, which he believes has been beneficial in managing his symptoms.  [name] continues to smoke, currently at a rate of a pack and a half per day. He expresses some intention to quit or at least cut back on smoking due to a potential job change that would limit his opportunities to smoke.  In the past, he has experienced issues with his breathing, which he attributed to humidity and heat. However, he reports that his breathing has leveled out since then. He rarely needs to use albuterol, indicating that his symptoms are generally well-controlled with his current medication regimen.  [name] received his flu shot and has no new medications. He is currently employed at [employer]. His last breathing test was conducted in January of the current year, and he is due for another in  six months.     DATA:  01/11/2023 PFTs: FEV1 3.42 L or 64% predicted, FVC 5.18 L or 78% predicted, FEV1/FVC 66%.  There is a 17% net change in FEV1 postbronchodilator indicating response.  Mild air trapping noted, diffusion capacity normal when corrected for alveolar volume.  Compared to prior studies performed 02 October 2019 there has been no significant change.   Review of Systems A 10 point review of systems was performed and it is as noted above otherwise negative.   Patient Active Problem List   Diagnosis Date Noted   Memory loss 05/23/2022   High risk medication use 05/23/2022   Bladder wall thickening 08/06/2020   GAD (generalized anxiety disorder) 09/02/2019   Cannabis use disorder, mild, in sustained remission 09/02/2019   Alcohol use disorder, moderate, dependence (HCC) 02/18/2019   Asthma with COPD (HCC) 09/26/2018   Paranoid schizophrenia (HCC) 02/05/2018   Vitamin D deficiency 08/09/2015   Reactive airway disease with status asthmaticus 08/09/2015   Cigarette nicotine dependence with nicotine-induced disorder 09/14/2014   Alcohol use disorder, mild, abuse 02/08/2011   Tobacco use disorder 02/08/2011   Allergic rhinitis 02/08/2011   SCHIZOPHRENIA, HX OF 02/08/2011    Social History   Tobacco Use   Smoking status: Every Day    Current packs/day: 1.50    Average packs/day: 1.5 packs/day for 20.0 years (30.0 ttl pk-yrs)    Types: Cigarettes   Smokeless tobacco: Never   Tobacco comments:    1.5 PPD khj 12/06/2023  Substance Use Topics  Alcohol use: Yes    Alcohol/week: 2.0 standard drinks of alcohol    Types: 2 Cans of beer per week    Comment: weekly    Allergies  Allergen Reactions   Hydroxyzine Hcl Rash    Current Meds  Medication Sig   benztropine (COGENTIN) 1 MG tablet TAKE 1 TABLET (1 MG TOTAL) BY MOUTH 2 (TWO) TIMES DAILY. FOR SIDE EFFECTS OF RISPERIDONE   dicyclomine (BENTYL) 10 MG capsule Take 1 capsule (10 mg total) by mouth 4 (four) times daily  -  before meals and at bedtime.   divalproex (DEPAKOTE ER) 250 MG 24 hr tablet TAKE 1 TABLET (250 MG TOTAL) BY MOUTH DAILY. TAKE ALONG WITH 500 MG , TOTAL DOSE OF 750 MG DAILY   divalproex (DEPAKOTE ER) 500 MG 24 hr tablet TAKE 1 TABLET (500 MG TOTAL) BY MOUTH DAILY. TAKE ALONG WITH 250 MG DAILY   gabapentin (NEURONTIN) 600 MG tablet TAKE 1 TABLET BY MOUTH THREE TIMES A DAY   risperiDONE (RISPERDAL) 0.25 MG tablet Take 2 tablets (0.5 mg total) by mouth at bedtime. Has supplies for now   triamcinolone (NASACORT) 55 MCG/ACT AERO nasal inhaler PLACE 1 SPRAY INTO THE NOSE 2 (TWO) TIMES DAILY.   [DISCONTINUED] albuterol (PROAIR HFA) 108 (90 Base) MCG/ACT inhaler Inhale 2 puffs into the lungs every 6 (six) hours as needed for wheezing or shortness of breath.   [DISCONTINUED] Fluticasone-Umeclidin-Vilant (TRELEGY ELLIPTA) 200-62.5-25 MCG/ACT AEPB INHALE 1 PUFF BY MOUTH EVERY DAY    Immunization History  Administered Date(s) Administered   Influenza, Seasonal, Injecte, Preservative Fre 10/02/2023   Pneumococcal Polysaccharide-23 08/09/2015   Tdap 07/19/2020        Objective:     BP 120/82 (BP Location: Right Arm, Cuff Size: Normal)   Pulse 69   Temp (!) 97.5 F (36.4 C)   Ht 6\' 5"  (1.956 m)   Wt 219 lb 12.8 oz (99.7 kg)   SpO2 98%   BMI 26.06 kg/m   SpO2: 98 % O2 Device: None (Room air)  GENERAL: Well-developed, well-nourished, no acute distress.  Fully ambulatory.  Speech is fluent. HEAD: Normocephalic, atraumatic.  EYES: Pupils equal, round, reactive to light.  No scleral icterus.  MOUTH: Dentition is intact though some teeth in poor repair.  Oral mucosa moist.  No thrush. NECK: Supple. No thyromegaly. Trachea midline. No JVD.  No adenopathy. PULMONARY: Good air entry bilaterally.  Coarse breath sounds otherwise, no adventitious sounds. CARDIOVASCULAR: S1 and S2. Regular rate and rhythm.  No rubs, murmurs or gallops heard. ABDOMEN: Benign. MUSCULOSKELETAL: No joint deformity, no  clubbing, no edema.  NEUROLOGIC: No focal deficit, no gait disturbance, speech is fluent. SKIN: Intact,warm,dry.  On limited exam, no rashes PSYCH: Flat affect, normal behavior.  Lab Results  Component Value Date   NITRICOXIDE 7 12/06/2023  *No evidence of type II inflammation   Assessment & Plan:     ICD-10-CM   1. Asthma-COPD overlap syndrome (HCC)  J44.89 albuterol (PROAIR HFA) 108 (90 Base) MCG/ACT inhaler   Continue Trelegy and as needed albuterol Azithromycin treatment pack, for 5 days    2. Shortness of breath  R06.02 Nitric oxide    3. Perennial allergic rhinitis with seasonal variation  J30.89    J30.2     4. Tobacco dependence due to cigarettes  F17.210       Orders Placed This Encounter  Procedures   Nitric oxide    Meds ordered this encounter  Medications   Fluticasone-Umeclidin-Vilant (TRELEGY ELLIPTA) 200-62.5-25  MCG/ACT AEPB    Sig: Inhale 1 puff into the lungs daily.    Dispense:  60 each    Refill:  11   albuterol (PROAIR HFA) 108 (90 Base) MCG/ACT inhaler    Sig: Inhale 2 puffs into the lungs every 6 (six) hours as needed for wheezing or shortness of breath.    Dispense:  8.5 each    Refill:  2    DX Code Needed  .   Discussion:    Asthma-COPD Overlap Syndrome (ACOS)   [name], a 37 year old current smoker, presents for a follow-up. Symptoms have been well-managed with Trelegy, with infrequent use of albuterol. He smokes 1.5 packs per day but is considering reduction or cessation due to a potential job change. Airway inflammation test results were satisfactory. Last pulmonary function test in January 2024 showed stable function.   - Continue Trelegy   - Refill Trelegy and albuterol prescriptions   - Encourage smoking reduction/cessation   - Schedule pulmonary function test in six months   - Follow up in six months unless symptoms worsen    General Health Maintenance   [name] has received his flu vaccination.   - Ensure annual flu vaccination.       Advised if symptoms do not improve or worsen, to please contact office for sooner follow up or seek emergency care.    I spent 30 minutes of dedicated to the care of this patient on the date of this encounter to include pre-visit review of records, face-to-face time with the patient discussing conditions above, post visit ordering of testing, clinical documentation with the electronic health record, making appropriate referrals as documented, and communicating necessary findings to members of the patients care team.   C. Danice Goltz, MD Advanced Bronchoscopy PCCM [facility]    *This note was generated using voice recognition software/Dragon and/or AI transcription program.  Despite best efforts to proofread, errors can occur which can change the meaning. Any transcriptional errors that result from this process are unintentional and may not be fully corrected at the time of dictation.

## 2023-12-08 ENCOUNTER — Other Ambulatory Visit: Payer: Self-pay | Admitting: Family Medicine

## 2023-12-08 DIAGNOSIS — F1020 Alcohol dependence, uncomplicated: Secondary | ICD-10-CM

## 2023-12-08 DIAGNOSIS — F411 Generalized anxiety disorder: Secondary | ICD-10-CM

## 2023-12-10 ENCOUNTER — Ambulatory Visit
Admission: EM | Admit: 2023-12-10 | Discharge: 2023-12-10 | Disposition: A | Discharge status: Home / Self Care | Payer: Medicare Other | Attending: Physician Assistant | Admitting: Physician Assistant

## 2023-12-10 ENCOUNTER — Ambulatory Visit (INDEPENDENT_AMBULATORY_CARE_PROVIDER_SITE_OTHER): Payer: Medicare Other

## 2023-12-10 DIAGNOSIS — W19XXXA Unspecified fall, initial encounter: Secondary | ICD-10-CM | POA: Diagnosis not present

## 2023-12-10 DIAGNOSIS — R0789 Other chest pain: Secondary | ICD-10-CM

## 2023-12-10 DIAGNOSIS — M25552 Pain in left hip: Secondary | ICD-10-CM | POA: Diagnosis not present

## 2023-12-10 DIAGNOSIS — R0781 Pleurodynia: Secondary | ICD-10-CM | POA: Diagnosis not present

## 2023-12-10 MED ORDER — PREDNISONE 20 MG PO TABS: 40.0000 mg | ORAL_TABLET | Freq: Every day | ORAL | 0 refills | Status: AC

## 2023-12-10 MED ORDER — CYCLOBENZAPRINE HCL 10 MG PO TABS: 10.0000 mg | ORAL_TABLET | Freq: Two times a day (BID) | ORAL | 0 refills | Status: DC | PRN

## 2023-12-10 NOTE — ED Triage Notes (Signed)
"  I fell Saturday while up on ladder putting Christmas lights in a tree, I fell down and landed on the ladder as well". "I hurt my left side coming down the ladder but have noticed a bump/area on my head, so may have hit it". "I fell about 8-10 ft". No loc at the time. No broken areas of skin "just bruising, left hip".

## 2023-12-10 NOTE — Telephone Encounter (Signed)
Requested Prescriptions  Pending Prescriptions Disp Refills   gabapentin (NEURONTIN) 600 MG tablet [Pharmacy Med Name: GABAPENTIN 600 MG TABLET] 270 tablet 0    Sig: TAKE 1 TABLET BY MOUTH THREE TIMES A DAY     Neurology: Anticonvulsants - gabapentin Failed - 12/10/2023 12:42 PM      Failed - Cr in normal range and within 360 days    Creat  Date Value Ref Range Status  07/21/2020 0.81 0.60 - 1.35 mg/dL Final   Creatinine, Ser  Date Value Ref Range Status  09/04/2023 0.73 (L) 0.76 - 1.27 mg/dL Final         Passed - Completed PHQ-2 or PHQ-9 in the last 360 days      Passed - Valid encounter within last 12 months    Recent Outpatient Visits           2 months ago Postprandial diarrhea   [place] [facility] Smitty Cords, DO   3 months ago Generalized abdominal pain   [place] [facility] Mecum, Oswaldo Conroy, PA-C   1 year ago Pleurisy   [place] [facility] Smitty Cords, DO   1 year ago Paranoid schizophrenia [facility])   [place] [facility] Smitty Cords, DO   1 year ago Acute viral syndrome   [facility], Netta Neat, [place]

## 2023-12-10 NOTE — ED Triage Notes (Signed)
Attempted to pull patient for Triage from waiting area "went to go get something to drink".

## 2023-12-20 ENCOUNTER — Encounter: Payer: Self-pay | Admitting: Physician Assistant

## 2023-12-20 NOTE — ED Provider Notes (Signed)
[name]    [name]: [id number] Arrival date & time: 12/10/23  1319      History   Chief Complaint Chief Complaint  Patient presents with   Fall   Injury    HPI Rodney [name] is a 37 y.o. male.   Patient here today for evaluation of fall that occurred about a week ago when he was hanging Christmas lights.  He reports that he felt down and landed on the ladder that he was on.  He states that he hit his left side coming down and noticed a small bump on his head but is unsure of any head injury.  He denies any loss of consciousness, nausea, vomiting.  He has not any vision changes.  He notes he fell about 8 to 10 feet.  He states that he has some bruising to his left hip as well which is somewhat painful.  He denies any abrasions or broken skin.  The history is provided by the patient.  Fall Pertinent negatives include no chest pain, no abdominal pain, no headaches and no shortness of breath.  Injury Pertinent negatives include no chest pain, no abdominal pain, no headaches and no shortness of breath.    Past Medical History:  Diagnosis Date   Anxiety    Bipolar disorder (HCC)    CIGARETTE SMOKER 02/08/2011   Qualifier: Diagnosis of  By: Laural Benes MD, [place]     Seizures [facility])     Patient Active Problem List   Diagnosis Date Noted   Memory loss 05/23/2022   High risk medication use 05/23/2022   Bladder wall thickening 08/06/2020   GAD (generalized anxiety disorder) 09/02/2019   Cannabis use disorder, mild, in sustained remission 09/02/2019   Alcohol use disorder, moderate, dependence (HCC) 02/18/2019   Asthma with COPD (HCC) 09/26/2018   Paranoid schizophrenia (HCC) 02/05/2018   Vitamin D deficiency 08/09/2015   Reactive airway disease with status asthmaticus 08/09/2015   Cigarette nicotine dependence with nicotine-induced disorder 09/14/2014   Alcohol use disorder, mild, abuse 02/08/2011   Tobacco use disorder 02/08/2011   Allergic rhinitis 02/08/2011    SCHIZOPHRENIA, HX OF 02/08/2011    Past Surgical History:  Procedure Laterality Date   WISDOM TOOTH EXTRACTION         Home Medications    Prior to Admission medications   Medication Sig Start Date End Date Taking? Authorizing Provider  cyclobenzaprine (FLEXERIL) 10 MG tablet Take 1 tablet (10 mg total) by mouth 2 (two) times daily as needed for muscle spasms. 12/10/23  Yes Tomi Bamberger, PA-C  albuterol [facility] HFA) 108 (90 Base) MCG/ACT inhaler Inhale 2 puffs into the lungs every 6 (six) hours as needed for wheezing or shortness of breath. 12/06/23   Salena Saner, MD  benztropine (COGENTIN) 1 MG tablet TAKE 1 TABLET (1 MG TOTAL) BY MOUTH 2 (TWO) TIMES DAILY. FOR SIDE EFFECTS OF RISPERIDONE 09/25/23   Jomarie Longs, MD  dicyclomine (BENTYL) 10 MG capsule Take 1 capsule (10 mg total) by mouth 4 (four) times daily -  before meals and at bedtime. 10/02/23   Karamalegos, Netta Neat, DO  divalproex (DEPAKOTE ER) 250 MG 24 hr tablet TAKE 1 TABLET (250 MG TOTAL) BY MOUTH DAILY. TAKE ALONG WITH 500 MG , TOTAL DOSE OF 750 MG DAILY 11/26/23   Jomarie Longs, MD  divalproex (DEPAKOTE ER) 500 MG 24 hr tablet TAKE 1 TABLET (500 MG TOTAL) BY MOUTH DAILY. TAKE ALONG WITH 250 MG DAILY 11/26/23   Eappen,  Saramma, MD  Fluticasone-Umeclidin-Vilant (TRELEGY ELLIPTA) 200-62.5-25 MCG/ACT AEPB Inhale 1 puff into the lungs daily. 12/06/23   Salena Saner, MD  gabapentin (NEURONTIN) 600 MG tablet TAKE 1 TABLET BY MOUTH THREE TIMES A DAY 10/22/23   Karamalegos, Netta Neat, DO  risperiDONE (RISPERDAL) 0.25 MG tablet Take 2 tablets (0.5 mg total) by mouth at bedtime. Has supplies for now 10/10/23   Jomarie Longs, MD  triamcinolone (NASACORT) 55 MCG/ACT AERO nasal inhaler PLACE 1 SPRAY INTO THE NOSE 2 (TWO) TIMES DAILY. 04/17/22   Salena Saner, MD    Family History Family History  Problem Relation Age of Onset   Anxiety disorder Mother    OCD Brother    Anxiety disorder Maternal Aunt     Anxiety disorder Maternal Grandmother    Anxiety disorder Paternal Grandfather     Social History Social History   Tobacco Use   Smoking status: Every Day    Current packs/day: 1.50    Average packs/day: 1.5 packs/day for 20.0 years (30.0 ttl pk-yrs)    Types: Cigarettes   Smokeless tobacco: Never   Tobacco comments:    1.5 PPD khj 12/06/2023  Vaping Use   Vaping status: Former  Substance Use Topics   Alcohol use: Yes    Alcohol/week: 2.0 standard drinks of alcohol    Types: 2 Cans of beer per week    Comment: weekly   Drug use: Not Currently    Types: Marijuana, Other-see comments, LSD    Comment: in past     Allergies   Hydroxyzine hcl   Review of Systems Review of Systems  Constitutional:  Negative for chills and fever.  Eyes:  Negative for discharge and redness.  Respiratory:  Negative for shortness of breath.   Cardiovascular:  Negative for chest pain.  Gastrointestinal:  Negative for abdominal distention, abdominal pain, blood in stool, nausea and vomiting.  Genitourinary:  Negative for dysuria and hematuria.  Musculoskeletal:  Positive for myalgias. Negative for arthralgias and back pain.  Skin:  Positive for color change. Negative for wound.  Neurological:  Negative for numbness and headaches.     Physical Exam Triage Vital Signs ED Triage Vitals  Encounter Vitals Group     BP 12/10/23 1340 121/75     Systolic BP Percentile --      Diastolic BP Percentile --      Pulse Rate 12/10/23 1340 60     Resp 12/10/23 1340 18     Temp 12/10/23 1340 97.9 F (36.6 C)     Temp Source 12/10/23 1340 Temporal     SpO2 12/10/23 1340 97 %     Weight 12/10/23 1339 220 lb (99.8 kg)     Height 12/10/23 1339 6\' 6"  (1.981 m)     Head Circumference --      Peak Flow --      Pain Score 12/10/23 1338 7     Pain Loc --      Pain Education --      Exclude from Growth Chart --    No data found.  Updated Vital Signs BP 121/75 (BP Location: Left Arm)   Pulse 62    Temp 97.9 F (36.6 C) (Temporal)   Resp 18   Ht 6\' 6"  (1.981 m)   Wt 220 lb (99.8 kg)   SpO2 97%   BMI 25.42 kg/m      Physical Exam Vitals and nursing note reviewed.  Constitutional:      General: He is  not in acute distress.    Appearance: Normal appearance. He is not ill-appearing.  HENT:     Head: Normocephalic and atraumatic.  Eyes:     Conjunctiva/sclera: Conjunctivae normal.  Cardiovascular:     Rate and Rhythm: Normal rate.  Pulmonary:     Effort: Pulmonary effort is normal.  Chest:     Comments: TTP noted to left lower lateral ribs Skin:    Comments: Bruising appreciated to left posterior lower back  Neurological:     Mental Status: He is alert.  Psychiatric:        Mood and Affect: Mood normal.        Behavior: Behavior normal.        Thought Content: Thought content normal.      UC Treatments / Results  Labs (all labs ordered are listed, but only abnormal results are displayed) Labs Reviewed - No data to display  EKG   Radiology No results found.  Procedures Procedures (including critical care time)  Medications Ordered in UC Medications - No data to display  Initial Impression / Assessment and Plan / UC Course  I have reviewed the triage vital signs and the nursing notes.  Pertinent labs & imaging results that were available during my care of the patient were reviewed by me and considered in my medical decision making (see chart for details).    Xray of left hip and left ribs without acute findings. Encouraged symptomatic treatment and will treat to cover muscle strain with prednisone and muscle relaxer. Encourage follow up if no gradual improvement or with any further concerns.   Final Clinical Impressions(s) / UC Diagnoses   Final diagnoses:  Fall, initial encounter  Left hip pain  Left-sided chest wall pain   Discharge Instructions   None    ED Prescriptions     Medication Sig Dispense Auth. Provider   predniSONE (DELTASONE)  20 MG tablet Take 2 tablets (40 mg total) by mouth daily with breakfast for 5 days. 10 tablet Erma Pinto F, PA-C   cyclobenzaprine (FLEXERIL) 10 MG tablet Take 1 tablet (10 mg total) by mouth 2 (two) times daily as needed for muscle spasms. 20 tablet Tomi Bamberger, PA-C      PDMP not reviewed this encounter.   Tomi Bamberger, PA-C 12/20/23 [phone]

## 2023-12-27 ENCOUNTER — Encounter: Payer: Self-pay | Admitting: Psychiatry

## 2023-12-27 ENCOUNTER — Ambulatory Visit (INDEPENDENT_AMBULATORY_CARE_PROVIDER_SITE_OTHER): Payer: Medicare Other | Admitting: Psychiatry

## 2023-12-27 VITALS — BP 140/92 | HR 75 | Temp 98.5°F | Ht 78.0 in | Wt 212.8 lb

## 2023-12-27 DIAGNOSIS — F411 Generalized anxiety disorder: Secondary | ICD-10-CM | POA: Diagnosis not present

## 2023-12-27 DIAGNOSIS — F1010 Alcohol abuse, uncomplicated: Secondary | ICD-10-CM | POA: Diagnosis not present

## 2023-12-27 DIAGNOSIS — F17200 Nicotine dependence, unspecified, uncomplicated: Secondary | ICD-10-CM | POA: Diagnosis not present

## 2023-12-27 DIAGNOSIS — F200 Paranoid schizophrenia: Secondary | ICD-10-CM | POA: Diagnosis not present

## 2023-12-27 MED ORDER — ESCITALOPRAM OXALATE 5 MG PO TABS: 5.0000 mg | ORAL_TABLET | Freq: Every day | ORAL | 1 refills | Status: DC

## 2023-12-27 NOTE — Patient Instructions (Signed)
Escitalopram Tablets What is this medication? ESCITALOPRAM (es sye TAL oh pram) treats depression and anxiety. It increases the amount of serotonin in the brain, a hormone that helps regulate mood. It belongs to a group of medications called SSRIs. This medicine may be used for other purposes; ask your health care provider or pharmacist if you have questions. COMMON BRAND NAME(S): Lexapro, PramLyte What should I tell my care team before I take this medication? They need to know if you have any of these conditions: Bipolar disorder or a family history of bipolar disorder Diabetes Glaucoma Heart disease Kidney disease Liver disease Receiving electroconvulsive therapy Seizures Suicidal thoughts, plans, or attempt by you or a family member An unusual or allergic reaction to escitalopram, other medications, foods, dyes, or preservatives Pregnant or trying to become pregnant Breastfeeding How should I use this medication? Take this medication by mouth with a glass of water. Take it as directed on the prescription label at the same time every day. You can take it with or without food. If it upsets your stomach, take it with food. Do not take it more often than directed. Do not stop taking this medication suddenly except upon the advice of your care team. Stopping this medication too quickly may cause serious side effects or your condition may worsen. A special MedGuide will be given to you by the pharmacist with each prescription and refill. Be sure to read this information carefully each time. Talk to your care team about the use of this medication in children. Special care may be needed. Overdosage: If you think you have taken too much of this medicine contact a poison control center or emergency room at once. NOTE: This medicine is only for you. Do not share this medicine with others. What if I miss a dose? If you miss a dose, take it as soon as you can. If it is almost time for your next dose,  take only that dose. Do not take double or extra doses. What may interact with this medication? Do not take this medication with any of the following: Certain medications for fungal infections, such as fluconazole, itraconazole, ketoconazole, posaconazole, voriconazole Cisapride Citalopram Dronedarone Linezolid MAOIs, such as Carbex, Eldepryl, Marplan, Nardil, and Parnate Methylene blue (injected into a vein) Pimozide Thioridazine This medication may also interact with the following: Alcohol Amphetamines Aspirin and aspirin-like medications Carbamazepine Certain medications for mental health conditions Certain medications for migraine headache, such as almotriptan, eletriptan, frovatriptan, naratriptan, rizatriptan, sumatriptan, zolmitriptan Certain medications for sleep Certain medications that treat or prevent blood clots, such as warfarin, enoxaparin, dalteparin Cimetidine Diuretics Dofetilide Fentanyl Furazolidone Isoniazid Lithium Metoprolol NSAIDs, medications for pain and inflammation, such as ibuprofen or naproxen Other medications that cause heart rhythm changes Procarbazine Rasagiline Supplements, such as St. John's wort, kava kava, valerian Tramadol Tryptophan Ziprasidone This list may not describe all possible interactions. Give your health care provider a list of all the medicines, herbs, non-prescription drugs, or dietary supplements you use. Also tell them if you smoke, drink alcohol, or use illegal drugs. Some items may interact with your medicine. What should I watch for while using this medication? Tell your care team if your symptoms do not get better or if they get worse. Visit your care team for regular checks on your progress. Because it may take several weeks to see the full effects of this medication, it is important to continue your treatment as prescribed by your care team. Watch for new or worsening thoughts of suicide or  depression. This includes  sudden changes in mood, behaviors, or thoughts. These changes can happen at any time but are more common in the beginning of treatment or after a change in dose. Call your care team right away if you experience these thoughts or worsening depression. This medication may cause mood and behavior changes, such as anxiety, nervousness, irritability, hostility, restlessness, excitability, hyperactivity, or trouble sleeping. These changes can happen at any time but are more common in the beginning of treatment or after a change in dose. Call your care team right away if you notice any of these symptoms. This medication may affect your coordination, reaction time, or judgment. Do not drive or operate machinery until you know how this medication affects you. Sit up or stand slowly to reduce the risk of dizzy or fainting spells. Drinking alcohol with this medication can increase the risk of these side effects. Your mouth may get dry. Chewing sugarless gum or sucking hard candy and drinking plenty of water may help. Contact your care team if the problem does not go away or is severe. What side effects may I notice from receiving this medication? Side effects that you should report to your care team as soon as possible: Allergic reactions--skin rash, itching, hives, swelling of the face, lips, tongue, or throat Bleeding--bloody or black, tar-like stools, red or dark brown urine, vomiting blood or brown material that looks like coffee grounds, small, red or purple spots on skin, unusual bleeding or bruising Heart rhythm changes--fast or irregular heartbeat, dizziness, feeling faint or lightheaded, chest pain, trouble breathing Low sodium level--muscle weakness, fatigue, dizziness, headache, confusion Serotonin syndrome--irritability, confusion, fast or irregular heartbeat, muscle stiffness, twitching muscles, sweating, high fever, seizure, chills, vomiting, diarrhea Sudden eye pain or change in vision such as blurry  vision, seeing halos around lights, vision loss Thoughts of suicide or self-harm, worsening mood, feelings of depression Side effects that usually do not require medical attention (report to your care team if they continue or are bothersome): Change in sex drive or performance Diarrhea Excessive sweating Nausea Tremors or shaking Upset stomach This list may not describe all possible side effects. Call your doctor for medical advice about side effects. You may report side effects to FDA at 1-800-FDA-1088. Where should I keep my medication? Keep out of reach of children and pets. Store at room temperature between 15 and 30 degrees C (59 and 86 degrees F). Throw away any unused medication after the expiration date. NOTE: This sheet is a summary. It may not cover all possible information. If you have questions about this medicine, talk to your doctor, pharmacist, or health care provider.  2024 [employer]/Gold Standard (2022-09-18 00:00:00)

## 2023-12-27 NOTE — Progress Notes (Signed)
 BH MD OP Progress Note  12/27/2023 5:09 PM Rodney [name]  MRN:  [id number]  Chief Complaint:  Chief Complaint  Patient presents with   Follow-up   HPI: Rodney [name] is a 38 year old Caucasian male, employed, lives in [place], has a history of schizophrenia, GAD, alcohol use disorder, cannabis abuse in remission, tobacco use disorder was evaluated in office today.  The patient, with a history of schizophrenia and gastrointestinal issues, presents with ongoing high stress and anxiety. He reports recent life events and work-related stressors as triggers for his anxiety, which manifests as worry and agitation. The patient denies any current gastrointestinal symptoms, stating that recent scans and tests have come back normal. He also reports a recent fall from a ladder while putting up Christmas lights, resulting in bruising and pain but no fractures as confirmed by x-rays.  The patient is currently employed and operates heavy machinery, including forklifts and cranes. He has recently secured a new job, although the start date and exact duties are yet to be confirmed. The patient has a history of smoking, currently consuming about a pack and a half of cigarettes per day, and acknowledges the need to cut back, especially in light of his new job.  Patient continues to use alcohol up to 6 packs on weekends.  Receptive to counseling.  Regarding his medication, the patient is currently on Depakote  and Risperidone , with a plan to gradually wean off the latter. He occasionally takes Cogentin  (benztropine ) and has previously been on Buspar . He expresses concern about potential side effects of SSRIs but is open to starting on Lexapro  for his anxiety. He also expresses a desire to work with a therapist to manage his stress and anxiety.  Patient currently denies any suicidality, homicidality or perceptual disturbances.  Visit Diagnosis:    ICD-10-CM   1. Paranoid schizophrenia (HCC)  F20.0     2. GAD (generalized  anxiety disorder)  F41.1 escitalopram  (LEXAPRO ) 5 MG tablet    3. Alcohol use disorder, mild, abuse  F10.10     4. Tobacco use disorder  F17.200       Past Psychiatric History: I have reviewed past psychiatric history from progress note on 09/23/2018.  Past Medical History:  Past Medical History:  Diagnosis Date   Anxiety    Bipolar disorder (HCC)    CIGARETTE SMOKER 02/08/2011   Qualifier: Diagnosis of  By: Vicci MD, Clan[place]     Seizures [facility])     Past Surgical History:  Procedure Laterality Date   WISDOM TOOTH EXTRACTION      Family Psychiatric History: I have reviewed family psychiatric history from progress note on 09/23/2018.  Family History:  Family History  Problem Relation Age of Onset   Anxiety disorder Mother    OCD Brother    Anxiety disorder Maternal Aunt    Anxiety disorder Maternal Grandmother    Anxiety disorder Paternal Grandfather     Social History: I have reviewed social history from progress note on 09/23/2018. Social History   Socioeconomic History   Marital status: Significant Other    Spouse name: Not on file   Number of children: 1   Years of education: Not on file   Highest education level: Associate degree: occupational, scientist, product/process development, or vocational program  Occupational History   Occupation: location manager     Comment: armed forces operational officer    Occupation: Shipping and receiving  Tobacco Use   Smoking status: Every Day    Current packs/day: 1.50    Average packs/day:  1.5 packs/day for 20.0 years (30.0 ttl pk-yrs)    Types: Cigarettes   Smokeless tobacco: Never   Tobacco comments:    1.5 PPD khj 12/06/2023  Vaping Use   Vaping status: Former  Substance and Sexual Activity   Alcohol use: Yes    Alcohol/week: 2.0 standard drinks of alcohol    Types: 2 Cans of beer per week    Comment: weekly   Drug use: Not Currently    Types: Marijuana, Other-see comments, LSD    Comment: in past   Sexual activity: Yes    Birth control/protection: None   Other Topics Concern   Not on file  Social History Narrative   Not on file   Social Drivers of Health   Financial Resource Strain: Medium Risk (10/16/2022)   Overall Financial Resource Strain (CARDIA)    Difficulty of Paying Living Expenses: Somewhat hard  Food Insecurity: No Food Insecurity (10/16/2022)   Hunger Vital Sign    Worried About Running Out of Food in the Last Year: Never true    Ran Out of Food in the Last Year: Never true  Transportation Needs: No Transportation Needs (10/16/2022)   PRAPARE - Administrator, Civil Service (Medical): No    Lack of Transportation (Non-Medical): No  Physical Activity: Inactive (10/16/2022)   Exercise Vital Sign    Days of Exercise per Week: 0 days    Minutes of Exercise per Session: 0 min  Stress: Not on file (11/05/2023)  Social Connections: Moderately Isolated (10/16/2022)   Social Connection and Isolation Panel [NHANES]    Frequency of Communication with Friends and Family: More than three times a week    Frequency of Social Gatherings with Friends and Family: More than three times a week    Attends Religious Services: Never    Database Administrator or Organizations: No    Attends Banker Meetings: Never    Marital Status: Living with partner    Allergies:  Allergies  Allergen Reactions   Hydroxyzine Hcl Rash    Metabolic Disorder Labs: Lab Results  Component Value Date   HGBA1C 5.3 06/05/2023   MPG 105.41 06/05/2023   MPG 105 03/04/2020   Lab Results  Component Value Date   PROLACTIN 11.4 06/05/2023   Lab Results  Component Value Date   CHOL 187 06/05/2023   TRIG 101 06/05/2023   HDL 65 06/05/2023   CHOLHDL 2.9 06/05/2023   VLDL 20 06/05/2023   LDLCALC 102 (H) 06/05/2023   LDLCALC 84 03/04/2020   Lab Results  Component Value Date   TSH 0.850 06/05/2023   TSH 3.543 05/24/2022    Therapeutic Level Labs: No results found for: LITHIUM Lab Results  Component Value Date    VALPROATE 64 06/05/2023   VALPROATE 61 05/24/2022   No results found for: CBMZ  Current Medications: Current Outpatient Medications  Medication Sig Dispense Refill   albuterol  (PROAIR  HFA) 108 (90 Base) MCG/ACT inhaler Inhale 2 puffs into the lungs every 6 (six) hours as needed for wheezing or shortness of breath. 8.5 each 2   benztropine  (COGENTIN ) 1 MG tablet TAKE 1 TABLET (1 MG TOTAL) BY MOUTH 2 (TWO) TIMES DAILY. FOR SIDE EFFECTS OF RISPERIDONE  180 tablet 0   cyclobenzaprine  (FLEXERIL ) 10 MG tablet Take 1 tablet (10 mg total) by mouth 2 (two) times daily as needed for muscle spasms. 20 tablet 0   dicyclomine  (BENTYL ) 10 MG capsule Take 1 capsule (10 mg total) by mouth 4 (  four) times daily -  before meals and at bedtime. 30 capsule 2   divalproex  (DEPAKOTE  ER) 250 MG 24 hr tablet TAKE 1 TABLET (250 MG TOTAL) BY MOUTH DAILY. TAKE ALONG WITH 500 MG , TOTAL DOSE OF 750 MG DAILY 90 tablet 0   divalproex  (DEPAKOTE  ER) 500 MG 24 hr tablet TAKE 1 TABLET (500 MG TOTAL) BY MOUTH DAILY. TAKE ALONG WITH 250 MG DAILY 90 tablet 0   escitalopram  (LEXAPRO ) 5 MG tablet Take 1 tablet (5 mg total) by mouth daily with supper. 30 tablet 1   Fluticasone -Umeclidin-Vilant (TRELEGY ELLIPTA ) 200-62.5-25 MCG/ACT AEPB Inhale 1 puff into the lungs daily. 60 each 11   gabapentin  (NEURONTIN ) 600 MG tablet TAKE 1 TABLET BY MOUTH THREE TIMES A DAY 270 tablet 0   risperiDONE  (RISPERDAL ) 0.25 MG tablet Take 2 tablets (0.5 mg total) by mouth at bedtime. Has supplies for now     triamcinolone  (NASACORT ) 55 MCG/ACT AERO nasal inhaler PLACE 1 SPRAY INTO THE NOSE 2 (TWO) TIMES DAILY. 16.9 each 12   No current facility-administered medications for this visit.     Musculoskeletal: Strength & Muscle Tone: within normal limits Gait & Station: normal Patient leans: N/A  Psychiatric Specialty Exam: Review of Systems  Psychiatric/Behavioral:  The patient is nervous/anxious.     Blood pressure (!) 140/92, pulse 75,  temperature 98.5 F (36.9 C), temperature source Temporal, height 6' 6 (1.981 m), weight 212 lb 12.8 oz (96.5 kg), SpO2 99%.Body mass index is 24.59 kg/m.  General Appearance: Fairly Groomed  Eye Contact:  Fair  Speech:  Clear and Coherent  Volume:  Normal  Mood:  Anxious  Affect:  Congruent  Thought Process:  Goal Directed and Descriptions of Associations: Intact  Orientation:  Full (Time, Place, and Person)  Thought Content: Logical   Suicidal Thoughts:  No  Homicidal Thoughts:  No  Memory:  Immediate;   Fair Recent;   Fair Remote;   Fair  Judgement:  Fair  Insight:  Fair  Psychomotor Activity:  Normal  Concentration:  Concentration: Fair and Attention Span: Fair  Recall:  [employer] of Knowledge: Fair  Language: Fair  Akathisia:  No  Handed:  Right  AIMS (if indicated): done  Assets:  Desire for Improvement Housing Social Support  ADL's:  Intact  Cognition: WNL  Sleep:  Fair   Screenings: Midwife Visit from 12/27/2023 in [facility] Office Visit from 10/10/2023 in [facility] Office Visit from 08/07/2023 in [facility] Office Visit from 02/07/2023 in [facility] Video Visit from 10/05/2022 in [facility]  AIMS Total Score 0 0 0 0 0      AUDIT    Flowsheet Row Video Visit from 08/01/2022 in [facility]  Alcohol Use Disorder Identification Test Final Score (AUDIT) 15      GAD-7    Flowsheet Row Office Visit from 12/27/2023 in [facility] Office Visit from 10/10/2023 in [facility] Office Visit from 10/02/2023 in [facility] Office Visit from 09/04/2023 in [facility] Office Visit  from 08/07/2023 in [facility]  Total GAD-7 Score 8 9 10 11 5       PHQ2-9    Flowsheet Row Office Visit from 12/27/2023 in [facility] Office Visit from 10/10/2023 in [facility] Office Visit from 10/02/2023 in [facility] Office Visit from 09/04/2023 in [facility] Office Visit from 08/07/2023 in [facility]  PHQ-2 Total Score 0 0 1 0 0  PHQ-9 Total Score -- -- 1 1 0      Flowsheet Row Office Visit from 12/27/2023 in [facility] ED from 12/10/2023 in [facility] ED from 11/19/2023 in [facility]   C-SSRS RISK CATEGORY No Risk No Risk No Risk        Assessment and Plan: Rodney [name] is a 38 year old Caucasian male, lives in [place], has a history of schizophrenia, alcohol use disorder, cannabis abuse in remission, presents with worsening anxiety, discussed assessment and plan as noted below.  Generalized anxiety disorder-unstable Reports high stress and anxiety related to life events and work. Discussed SSRIs for anxiety management, including his role in balancing serotonin levels to improve mood and reduce anxiety. Emphasized taking medication with food and potential long-term treatment. Addressed initial side effects like bloating, nausea, and diarrhea, which typically improve.  - Start Lexapro  5 mg daily with supper   - Refer to therapist for concurrent therapy sessions   - Schedule follow-up in four weeks via video    Schizophrenia (in remission)   Schizophrenia in remission for over ten years. No current symptoms of hallucinations or delusions. Currently on risperidone  and Depakote . Discussed monitoring for tardive dyskinesia and other side effects.   - Continue  risperidone  0.75 mg nightly - Continue Depakote  750 mg daily, Depakote  level -06/05/2023-64-therapeutic - Monitor for tardive dyskinesia and other side effects    Smoking Cessation/tobacco use disorder-unstable Smokes approximately 1.5 packs of cigarettes per day. Acknowledges negative impact of smoking on anxiety and overall health. Expressed desire to cut back and eventually quit, especially after starting a new job. Discussed how nicotine  withdrawal can increase anxiety and heart rate.   - Discuss smoking cessation options, including medications like Chantix    - Encourage gradual reduction in smoking   - Provide education on the impact of smoking on health and anxiety, provided counseling for 1 minute.  Alcohol use disorder mild-unstable Alcohol use limited to special occasions and weekends, averaging six beers per weekend. Advised on interaction between alcohol and medications. Discussed how alcohol withdrawal can contribute to anxiety.   - Advise against mixing Lexapro  with alcohol   - Encourage moderation in alcohol consumption    Follow-up   - Schedule follow-up appointment in four weeks via video   - Provide information about Lexapro  on My Chart.   Collaboration of Care: Collaboration of Care: Referral or follow-up with counselor/therapist AEB I have communicated with staff to schedule this patient with in-house therapist.  Patient/Guardian was advised Release of Information must be obtained prior to any record release in order to collaborate their care with an outside provider. Patient/Guardian was advised if they have not already done so to contact the registration department to sign all necessary forms in order for us  to release information regarding their care.   Consent: Patient/Guardian gives verbal consent for treatment and assignment of benefits for services provided during this visit. Patient/Guardian expressed understanding and agreed to proceed.   This note was generated  in part or whole with voice recognition software. Voice recognition is usually quite accurate but there are transcription  errors that can and very often do occur. I apologize for any typographical errors that were not detected and corrected.    Merdis Snodgrass, MD 12/27/2023, 5:09 PM

## 2023-12-31 NOTE — Progress Notes (Deleted)
 Ellouise Console, PA-C [address]  [address]  [place], [place] [address]  Main: [phone]  Fax: [phone]   Primary Care Physician: Edman Marsa PARAS, DO  Primary Gastroenterologist:  ***  CC:  HPI: LUISALBERTO BEEGLE is a 38 y.o. male returns for 6-week follow-up of left side abdominal pain, intermittent diarrhea, and probable irritable bowel syndrome.  Chronic GI symptoms for many years.  11/29/2023 CT abdomen pelvis with contrast: Mild wall thickening of the urinary bladder.  Otherwise no acute abnormality.  No evidence of bowel inflammation.  09/2023 RUQ ultrasound: Normal, no gallstones.  08/2023: CBC, CMP, lipase labs normal.  Hemoglobin 14.9.  Currently taking Bentyl  10 mg 4 times daily as needed.  No previous EGD or colonoscopy.  Current Outpatient Medications  Medication Sig Dispense Refill   albuterol  (PROAIR  HFA) 108 (90 Base) MCG/ACT inhaler Inhale 2 puffs into the lungs every 6 (six) hours as needed for wheezing or shortness of breath. 8.5 each 2   benztropine  (COGENTIN ) 1 MG tablet TAKE 1 TABLET (1 MG TOTAL) BY MOUTH 2 (TWO) TIMES DAILY. FOR SIDE EFFECTS OF RISPERIDONE  180 tablet 0   cyclobenzaprine  (FLEXERIL ) 10 MG tablet Take 1 tablet (10 mg total) by mouth 2 (two) times daily as needed for muscle spasms. 20 tablet 0   dicyclomine  (BENTYL ) 10 MG capsule Take 1 capsule (10 mg total) by mouth 4 (four) times daily -  before meals and at bedtime. 30 capsule 2   divalproex  (DEPAKOTE  ER) 250 MG 24 hr tablet TAKE 1 TABLET (250 MG TOTAL) BY MOUTH DAILY. TAKE ALONG WITH 500 MG , TOTAL DOSE OF 750 MG DAILY 90 tablet 0   divalproex  (DEPAKOTE  ER) 500 MG 24 hr tablet TAKE 1 TABLET (500 MG TOTAL) BY MOUTH DAILY. TAKE ALONG WITH 250 MG DAILY 90 tablet 0   escitalopram  (LEXAPRO ) 5 MG tablet Take 1 tablet (5 mg total) by mouth daily with supper. 30 tablet 1   Fluticasone -Umeclidin-Vilant (TRELEGY ELLIPTA ) 200-62.5-25 MCG/ACT AEPB Inhale 1 puff into the lungs daily. 60  each 11   gabapentin  (NEURONTIN ) 600 MG tablet TAKE 1 TABLET BY MOUTH THREE TIMES A DAY 270 tablet 0   risperiDONE  (RISPERDAL ) 0.25 MG tablet Take 2 tablets (0.5 mg total) by mouth at bedtime. Has supplies for now     triamcinolone  (NASACORT ) 55 MCG/ACT AERO nasal inhaler PLACE 1 SPRAY INTO THE NOSE 2 (TWO) TIMES DAILY. 16.9 each 12   No current facility-administered medications for this visit.    Allergies as of 01/01/2024 - Review Complete 12/27/2023  Allergen Reaction Noted   Hydroxyzine hcl Rash 07/09/2013    Past Medical History:  Diagnosis Date   Anxiety    Bipolar disorder (HCC)    CIGARETTE SMOKER 02/08/2011   Qualifier: Diagnosis of  By: Vicci MD, [place]     Seizures [facility])     Past Surgical History:  Procedure Laterality Date   WISDOM TOOTH EXTRACTION      Review of Systems:    All systems reviewed and negative except where noted in HPI.   Physical Examination:   There were no vitals taken for this visit.  General: Well-nourished, well-developed in no acute distress.  Lungs: Clear to auscultation bilaterally. Non-labored. Heart: Regular rate and rhythm, no murmurs rubs or gallops.  Abdomen: Bowel sounds are normal; Abdomen is Soft; No hepatosplenomegaly, masses or hernias;  No Abdominal Tenderness; No guarding or rebound tenderness. Neuro: Alert and oriented x 3.  Grossly intact.  Psych: Alert  and cooperative, normal mood and affect.   Imaging Studies: DG Ribs Unilateral W/Chest Left Result Date: 12/10/2023 CLINICAL DATA:  Injury.  Pain. EXAM: LEFT RIBS AND CHEST - 3+ VIEW COMPARISON:  None Available. FINDINGS: Old left clavicle fracture again noted. The lungs are clear without focal pneumonia, edema, pneumothorax or pleural effusion. The cardiopericardial silhouette is within normal limits for size. Oblique views of the left ribs show no evidence for an acute displaced left-sided rib fracture IMPRESSION: 1. No evidence for an acute displaced left-sided rib  fracture. 2. Old left clavicle fracture. Electronically Signed   By: Camellia Candle M.D.   On: 12/10/2023 16:11   DG Hip Unilat With Pelvis 2-3 Views Left Result Date: 12/10/2023 CLINICAL DATA:  Left hip pain since falling 2 days ago. EXAM: DG HIP (WITH OR WITHOUT PELVIS) 2-3V LEFT COMPARISON:  Pelvic CT 11/29/2023. FINDINGS: The mineralization and alignment are normal. There is no evidence of acute fracture or dislocation. No evidence of femoral head osteonecrosis. The hip and sacroiliac joint spaces are preserved. No foreign body or other focal soft tissue abnormality identified. IMPRESSION: No evidence of acute fracture or dislocation. Electronically Signed   By: Elsie Perone M.D.   On: 12/10/2023 15:34    Assessment and Plan:   MURAT RIDEOUT is a 38 y.o. y/o male returns for 6-week follow-up of intermittent abdominal pain and diarrhea.  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  Recent labs, abdominal pelvic CT were unrevealing.  1.  IBS-D  Continue Bentyl  10 Mg 4 times daily as needed  Align probiotic  Low FODMAP diet  2.  Colon cancer screening  Start for screening colonoscopy at age 21.  Sooner if he develops any alarm symptoms.  Ellouise Console, PA-C  Follow up ***  BP check ***

## 2024-01-01 ENCOUNTER — Encounter: Payer: Self-pay | Admitting: Physician Assistant

## 2024-01-01 ENCOUNTER — Ambulatory Visit: Payer: Medicare Other | Admitting: Physician Assistant

## 2024-01-01 NOTE — Telephone Encounter (Signed)
Called and reschedule patient.

## 2024-01-07 ENCOUNTER — Encounter (INDEPENDENT_AMBULATORY_CARE_PROVIDER_SITE_OTHER): Payer: Medicare Other

## 2024-01-07 DIAGNOSIS — F411 Generalized anxiety disorder: Secondary | ICD-10-CM | POA: Diagnosis not present

## 2024-01-09 MED ORDER — VENLAFAXINE HCL ER 37.5 MG PO CP24: 37.5000 mg | ORAL_CAPSULE | Freq: Every day | ORAL | 1 refills | Status: DC

## 2024-01-09 NOTE — Telephone Encounter (Signed)
 Patient with side effects to Lexapro  currently not on it.  Patient interested in starting venlafaxine .  Okay to start low-dose venlafaxine  extended release 37.5 mg.  I have sent a prescription to pharmacy and have communicated with patient.   I have spent at least 5 minutes non face to face with patient today.

## 2024-01-21 ENCOUNTER — Other Ambulatory Visit: Payer: Self-pay

## 2024-01-21 ENCOUNTER — Encounter: Payer: Self-pay | Admitting: Emergency Medicine

## 2024-01-21 ENCOUNTER — Ambulatory Visit
Admission: EM | Admit: 2024-01-21 | Discharge: 2024-01-21 | Disposition: A | Discharge status: Home / Self Care | Payer: Medicare Other | Attending: Emergency Medicine | Admitting: Emergency Medicine

## 2024-01-21 ENCOUNTER — Other Ambulatory Visit: Payer: Self-pay | Admitting: Psychiatry

## 2024-01-21 DIAGNOSIS — F411 Generalized anxiety disorder: Secondary | ICD-10-CM

## 2024-01-21 DIAGNOSIS — M542 Cervicalgia: Secondary | ICD-10-CM

## 2024-01-21 DIAGNOSIS — S060X9A Concussion with loss of consciousness of unspecified duration, initial encounter: Secondary | ICD-10-CM

## 2024-01-21 MED ORDER — PREDNISONE 20 MG PO TABS: 40.0000 mg | ORAL_TABLET | Freq: Every day | ORAL | 0 refills | Status: AC

## 2024-01-21 MED ORDER — CYCLOBENZAPRINE HCL 10 MG PO TABS: 10.0000 mg | ORAL_TABLET | Freq: Two times a day (BID) | ORAL | 0 refills | Status: AC | PRN

## 2024-01-21 NOTE — Discharge Instructions (Addendum)
Today you have been evaluated for concussion, unable to complete higher level imaging as this is urgent care and we do not have this imaging available  You are still experiencing dizziness and lightheadedness and brain fog I do believe that you have experienced a mild concussion, would recommend low stimulation activities until symptoms resolve  Please reach out to your PCP for a follow-up visit and so they can schedule imaging outpatient  For any worsening symptoms please go to the nearest emergency department for immediate evaluation  On exam neck pain appears to be more muscular and there is no tenderness when I am directly over the spine  Starting tomorrow take prednisone every morning with food for 5 days to reduce internal inflammation and help with pain, may take Tylenol or any topical medicine in addition to this  May use muscle relaxant twice daily as needed for additional comfort, be mindful this can make you sleepy  You may use ice or heat over the affected area in 10 to 15-minute intervals  You may massage and stretch as tolerated  You may place pillows underneath arms and behind the back

## 2024-01-21 NOTE — ED Provider Notes (Signed)
Rodney [name]    [name]: [id number] Arrival date & time: 01/21/24  1557      History   Chief Complaint Chief Complaint  Patient presents with   Fall    HPI Rodney [name] is a 38 y.o. male.   Patient presents for evaluation of a head injury that occurred 2 days ago.  Endorses that he tripped over "hanging on the back of the chair causing him to fall hitting the back of the head on the wall and the ground, endorses short loss of consciousness for " just an instance".  Fall unwitnessed.  Has been experiencing intermittent headaches, dizziness, lightheadedness and brain fog described as not feeling as sharp or taking longer time to process information.  Experiencing pain to the posterior neck radiating into the upper back.  Has taken Tylenol for pain.  Denies memory or speech changes, weakness, visual disturbance.  Past Medical History:  Diagnosis Date   Anxiety    Bipolar disorder (HCC)    CIGARETTE SMOKER 02/08/2011   Qualifier: Diagnosis of  By: Laural Benes MD, [place]     Seizures [facility])     Patient Active Problem List   Diagnosis Date Noted   Memory loss 05/23/2022   High risk medication use 05/23/2022   Bladder wall thickening 08/06/2020   GAD (generalized anxiety disorder) 09/02/2019   Cannabis use disorder, mild, in sustained remission 09/02/2019   Alcohol use disorder, moderate, dependence (HCC) 02/18/2019   Asthma with COPD (HCC) 09/26/2018   Paranoid schizophrenia (HCC) 02/05/2018   Vitamin D deficiency 08/09/2015   Reactive airway disease with status asthmaticus 08/09/2015   Cigarette nicotine dependence with nicotine-induced disorder 09/14/2014   Alcohol use disorder, mild, abuse 02/08/2011   Tobacco use disorder 02/08/2011   Allergic rhinitis 02/08/2011   SCHIZOPHRENIA, HX OF 02/08/2011    Past Surgical History:  Procedure Laterality Date   WISDOM TOOTH EXTRACTION         Home Medications    Prior to Admission medications   Medication Sig Start Date  End Date Taking? Authorizing Provider  cyclobenzaprine (FLEXERIL) 10 MG tablet Take 1 tablet (10 mg total) by mouth 2 (two) times daily as needed for muscle spasms. 01/21/24  Yes Abhiram Criado R, NP  predniSONE (DELTASONE) 20 MG tablet Take 2 tablets (40 mg total) by mouth daily. 01/21/24  Yes Dorothyann Mourer R, NP  albuterol (PROAIR HFA) 108 (90 Base) MCG/ACT inhaler Inhale 2 puffs into the lungs every 6 (six) hours as needed for wheezing or shortness of breath. 12/06/23   Salena Saner, MD  benztropine (COGENTIN) 1 MG tablet TAKE 1 TABLET (1 MG TOTAL) BY MOUTH 2 (TWO) TIMES DAILY. FOR SIDE EFFECTS OF RISPERIDONE 09/25/23   Jomarie Longs, MD  dicyclomine (BENTYL) 10 MG capsule Take 1 capsule (10 mg total) by mouth 4 (four) times daily -  before meals and at bedtime. 10/02/23   Karamalegos, Netta Neat, DO  divalproex (DEPAKOTE ER) 250 MG 24 hr tablet TAKE 1 TABLET (250 MG TOTAL) BY MOUTH DAILY. TAKE ALONG WITH 500 MG , TOTAL DOSE OF 750 MG DAILY 11/26/23   Jomarie Longs, MD  divalproex (DEPAKOTE ER) 500 MG 24 hr tablet TAKE 1 TABLET (500 MG TOTAL) BY MOUTH DAILY. TAKE ALONG WITH 250 MG DAILY 11/26/23   Jomarie Longs, MD  escitalopram (LEXAPRO) 5 MG tablet Take 1 tablet (5 mg total) by mouth daily with supper. Patient not taking: Reported on 01/21/2024 12/27/23   Jomarie Longs, MD  Fluticasone-Umeclidin-Vilant (TRELEGY  ELLIPTA) 200-62.5-25 MCG/ACT AEPB Inhale 1 puff into the lungs daily. 12/06/23   Salena Saner, MD  gabapentin (NEURONTIN) 600 MG tablet TAKE 1 TABLET BY MOUTH THREE TIMES A DAY 10/22/23   Karamalegos, Netta Neat, DO  risperiDONE (RISPERDAL) 0.25 MG tablet Take 2 tablets (0.5 mg total) by mouth at bedtime. Has supplies for now 10/10/23   Jomarie Longs, MD  triamcinolone (NASACORT) 55 MCG/ACT AERO nasal inhaler PLACE 1 SPRAY INTO THE NOSE 2 (TWO) TIMES DAILY. 04/17/22   Salena Saner, MD  venlafaxine XR (EFFEXOR XR) 37.5 MG 24 hr capsule Take 1 capsule (37.5 mg total) by  mouth daily with breakfast. 01/09/24   Jomarie Longs, MD    Family History Family History  Problem Relation Age of Onset   Anxiety disorder Mother    OCD Brother    Anxiety disorder Maternal Aunt    Anxiety disorder Maternal Grandmother    Anxiety disorder Paternal Grandfather     Social History Social History   Tobacco Use   Smoking status: Every Day    Current packs/day: 1.50    Average packs/day: 1.5 packs/day for 20.0 years (30.0 ttl pk-yrs)    Types: Cigarettes   Smokeless tobacco: Never   Tobacco comments:    1.5 PPD khj 12/06/2023  Vaping Use   Vaping status: Former  Substance Use Topics   Alcohol use: Yes    Alcohol/week: 2.0 standard drinks of alcohol    Types: 2 Cans of beer per week    Comment: weekly   Drug use: Not Currently    Types: Marijuana, Other-see comments, LSD    Comment: in past     Allergies   Hydroxyzine hcl   Review of Systems Review of Systems   Physical Exam Triage Vital Signs ED Triage Vitals  Encounter Vitals Group     BP 01/21/24 1640 (!) 163/95     Systolic BP Percentile --      Diastolic BP Percentile --      Pulse Rate 01/21/24 1640 76     Resp 01/21/24 1640 20     Temp 01/21/24 1640 98.2 F (36.8 C)     Temp Source 01/21/24 1640 Oral     SpO2 01/21/24 1640 97 %     Weight --      Height --      Head Circumference --      Peak Flow --      Pain Score 01/21/24 1638 7     Pain Loc --      Pain Education --      Exclude from Growth Chart --    No data found.  Updated Vital Signs BP (!) 163/95 (BP Location: Left Arm) Comment (BP Location): large cuff  Pulse 76   Temp 98.2 F (36.8 C) (Oral)   Resp 20   SpO2 97%   Visual Acuity Right Eye Distance:   Left Eye Distance:   Bilateral Distance:    Right Eye Near:   Left Eye Near:    Bilateral Near:     Physical Exam Constitutional:      Appearance: Normal appearance.  HENT:     Head: Normocephalic.  Eyes:     Extraocular Movements: Extraocular  movements intact.     Conjunctiva/sclera: Conjunctivae normal.     Pupils: Pupils are equal, round, and reactive to light.  Neck:     Comments: Point tenderness to the left lateral base of the neck without ecchymosis swelling or deformity,  rigidity or crepitus, Tenderness present to the right lateral aspect without point tenderness, ecchymosis swelling or deformity, rigidity or crepitus, no spinal tenderness noted, able to complete range of motion but pain is elicited with movement, 2+ carotid pulses bilaterally Pulmonary:     Effort: Pulmonary effort is normal.  Neurological:     General: No focal deficit present.     Mental Status: He is alert and oriented to person, place, and time. Mental status is at baseline.     Cranial Nerves: No cranial nerve deficit.     Sensory: No sensory deficit.     Motor: No weakness.     Coordination: Coordination normal.     Gait: Gait normal.      UC Treatments / Results  Labs (all labs ordered are listed, but only abnormal results are displayed) Labs Reviewed - No data to display  EKG   Radiology No results found.  Procedures Procedures (including critical care time)  Medications Ordered in UC Medications - No data to display  Initial Impression / Assessment and Plan / UC Course  I have reviewed the triage vital signs and the nursing notes.  Pertinent labs & imaging results that were available during my care of the patient were reviewed by me and considered in my medical decision making (see chart for details).  Concussion with loss of consciousness, initial encounter, neck pain on right side  Vital signs are stable, no neurological deficits on exam, patient in no signs of distress nontoxic-appearing, stable for outpatient management, [place] experiencing neurological symptoms 2 days post injury do believe that he is most likely had a mild concussion, unable to complete imaging as it is unavailable, discussed this with patient, advised him to  follow-up with primary doctor as he is declined to go to the emergency department at this time, pain appears to be muscular therefore deferring imaging at this time, prescribed prednisone and Flexeril recommended low stimulation activities with follow-up with PCP within the next 1 to 2 weeks, given strict precautions for any worsening symptoms to go to the nearest emergency department for immediate evaluation, verbalized understanding Final Clinical Impressions(s) / UC Diagnoses   Final diagnoses:  Concussion with loss of consciousness, initial encounter  Neck pain on right side     Discharge Instructions      Today you have been evaluated for concussion, unable to complete higher level imaging as this is urgent care and we do not have this imaging available  You are still experiencing dizziness and lightheadedness and brain fog I do believe that you have experienced a mild concussion, would recommend low stimulation activities until symptoms resolve  Please reach out to your PCP for a follow-up visit and so they can schedule imaging outpatient  For any worsening symptoms please go to the nearest emergency department for immediate evaluation  On exam neck pain appears to be more muscular and there is no tenderness when I am directly over the spine  Starting tomorrow take prednisone every morning with food for 5 days to reduce internal inflammation and help with pain, may take Tylenol or any topical medicine in addition to this  May use muscle relaxant twice daily as needed for additional comfort, be mindful this can make you sleepy  You may use ice or heat over the affected area in 10 to 15-minute intervals  You may massage and stretch as tolerated  You may place pillows underneath arms and behind the back   ED Prescriptions  Medication Sig Dispense Auth. Provider   predniSONE (DELTASONE) 20 MG tablet Take 2 tablets (40 mg total) by mouth daily. 10 tablet Verland Sprinkle, Hansel Starling R, NP    cyclobenzaprine (FLEXERIL) 10 MG tablet Take 1 tablet (10 mg total) by mouth 2 (two) times daily as needed for muscle spasms. 20 tablet Valinda Hoar, NP      PDMP not reviewed this encounter.   Valinda Hoar, NP 01/21/24 1728

## 2024-01-21 NOTE — ED Triage Notes (Signed)
Patient reports he fell on Saturday.  States he hit the back of head on a wall after falling to the ground.  Reports fall was a result of tripping over a coat hanging on the back of a chair.    When asked if he had loc, replies "just for an instance".   Patient took a nap today, but woke thinking he is still not himself.  Complains of head fog, not as sharp. Patient has taken tylenol today.

## 2024-01-22 ENCOUNTER — Other Ambulatory Visit: Payer: Self-pay | Admitting: Family Medicine

## 2024-01-22 DIAGNOSIS — F411 Generalized anxiety disorder: Secondary | ICD-10-CM

## 2024-01-22 DIAGNOSIS — F1020 Alcohol dependence, uncomplicated: Secondary | ICD-10-CM

## 2024-01-24 NOTE — Telephone Encounter (Signed)
Requested Prescriptions  Pending Prescriptions Disp Refills   gabapentin (NEURONTIN) 600 MG tablet [Pharmacy Med Name: GABAPENTIN 600 MG TABLET] 270 tablet 0    Sig: TAKE 1 TABLET BY MOUTH THREE TIMES A DAY     Neurology: Anticonvulsants - gabapentin Failed - 01/24/2024  1:26 PM      Failed - Cr in normal range and within 360 days    Creat  Date Value Ref Range Status  07/21/2020 0.81 0.60 - 1.35 mg/dL Final   Creatinine, Ser  Date Value Ref Range Status  09/04/2023 0.73 (L) 0.76 - 1.27 mg/dL Final         Passed - Completed PHQ-2 or PHQ-9 in the last 360 days      Passed - Valid encounter within last 12 months    Recent Outpatient Visits           3 months ago Postprandial diarrhea   [employer] [facility] Smitty Cords, DO   4 months ago Generalized abdominal pain   [place] [facility] [facility] Mecum, Oswaldo Conroy, PA-C   1 year ago Pleurisy   [facility] Smitty Cords, DO   1 year ago Paranoid schizophrenia [facility])   [place] [facility] Smitty Cords, DO   1 year ago Acute viral syndrome   [place] [facility] Smitty Cords, DO       Future Appointments             In 4 months Salena Saner, MD [facility]

## 2024-01-27 NOTE — Progress Notes (Unsigned)
Celso Amy, PA-C [address]  [address]  [place], [place] [address]  Main: [phone]  Fax: [phone]   Primary Care Physician: Smitty Cords, DO  Primary Gastroenterologist:  Celso Amy, PA-C   CC: F/U LUQ and LLQ Pain  HPI: DONTAVION NOXON is a 38 y.o. male returns for 16-month follow-up of LUQ and LLQ pain ongoing for 6 months.  History of GI symptoms for many years.  Occasional episodes of diarrhea.  Tried Bentyl 10 Mg 3 times daily as needed with some benefit.  11/29/2023 CT abdomen pelvis with contrast: Mild wall thickening of urinary bladder, otherwise no acute abnormality.  He was advised to follow-up with PCP to check UA and rule out UTI.  No evidence of diverticulitis, pancreatitis, or any GI abnormality.  No explanation for left side abdominal pain.  10/09/2023 RUQ ultrasound: Normal.   09/04/23 Labs:  Normal CBC, CMP, Amylase & Lipase.  Hgb 14.9g.  Normal TSH.  Current symptoms: He continues to have occasional LUQ, left side, and LLQ pain.  Abdominal pain is less frequent and less severe.  Still has occasional episode of diarrhea which has improved.  He admits to occasional episode of bright red blood on the tissue after bowel movements.  Current Outpatient Medications  Medication Sig Dispense Refill   albuterol (PROAIR HFA) 108 (90 Base) MCG/ACT inhaler Inhale 2 puffs into the lungs every 6 (six) hours as needed for wheezing or shortness of breath. 8.5 each 2   benztropine (COGENTIN) 1 MG tablet TAKE 1 TABLET (1 MG TOTAL) BY MOUTH 2 (TWO) TIMES DAILY. FOR SIDE EFFECTS OF RISPERIDONE 180 tablet 0   cyclobenzaprine (FLEXERIL) 10 MG tablet Take 1 tablet (10 mg total) by mouth 2 (two) times daily as needed for muscle spasms. 20 tablet 0   divalproex (DEPAKOTE ER) 250 MG 24 hr tablet TAKE 1 TABLET (250 MG TOTAL) BY MOUTH DAILY. TAKE ALONG WITH 500 MG , TOTAL DOSE OF 750 MG DAILY 90 tablet 0   divalproex (DEPAKOTE ER) 500 MG 24 hr tablet TAKE 1 TABLET (500  MG TOTAL) BY MOUTH DAILY. TAKE ALONG WITH 250 MG DAILY 90 tablet 0   Fluticasone-Umeclidin-Vilant (TRELEGY ELLIPTA) 200-62.5-25 MCG/ACT AEPB Inhale 1 puff into the lungs daily. 60 each 11   gabapentin (NEURONTIN) 600 MG tablet TAKE 1 TABLET BY MOUTH THREE TIMES A DAY 270 tablet 0   Na Sulfate-K Sulfate-Mg Sulfate concentrate 17.5-3.13-1.6 GM/177ML SOLN Take 1 kit by mouth once for 1 dose. 354 mL 0   polyethylene glycol-electrolytes (NULYTELY) 420 g solution Take 4,000 mLs by mouth once for 1 dose. Use as directed for your colonoscopy preparation 4000 mL 0   predniSONE (DELTASONE) 20 MG tablet Take 2 tablets (40 mg total) by mouth daily. 10 tablet 0   risperiDONE (RISPERDAL) 0.25 MG tablet Take 2 tablets (0.5 mg total) by mouth at bedtime. Has supplies for now     triamcinolone (NASACORT) 55 MCG/ACT AERO nasal inhaler PLACE 1 SPRAY INTO THE NOSE 2 (TWO) TIMES DAILY. 16.9 each 12   venlafaxine XR (EFFEXOR XR) 37.5 MG 24 hr capsule Take 1 capsule (37.5 mg total) by mouth daily with breakfast. 30 capsule 1   dicyclomine (BENTYL) 10 MG capsule Take 1 capsule (10 mg total) by mouth 4 (four) times daily -  before meals and at bedtime. 30 capsule 5   No current facility-administered medications for this visit.    Allergies as of 01/28/2024 - Review Complete 01/28/2024  Allergen Reaction  Noted   Hydroxyzine hcl Rash 07/09/2013    Past Medical History:  Diagnosis Date   Anxiety    Bipolar disorder (HCC)    CIGARETTE SMOKER 02/08/2011   Qualifier: Diagnosis of  By: Laural Benes MD, [place]     Seizures [facility])     Past Surgical History:  Procedure Laterality Date   WISDOM TOOTH EXTRACTION      Review of Systems:    All systems reviewed and negative except where noted in HPI.   Physical Examination:   BP 138/79   Pulse 79   Temp 97.9 F (36.6 C)   Ht 6\' 5"  (1.956 m)   Wt 219 lb 9.6 oz (99.6 kg)   BMI 26.04 kg/m   General: Well-nourished, well-developed in no acute distress.  Lungs: Clear  to auscultation bilaterally. Non-labored. Heart: Regular rate and rhythm, no murmurs rubs or gallops.  Abdomen: Bowel sounds are normal; Abdomen is Soft; No hepatosplenomegaly, masses or hernias;  Mild LUQ and LLQ Abdominal Tenderness; No Right Sided abdominal tenderness.  No guarding or rebound tenderness. Neuro: Alert and oriented x 3.  Grossly intact.  Psych: Alert and cooperative, normal mood and affect.  Imaging Studies: No results found.  Assessment and Plan:   TAARIQ LEITZ is a 38 y.o. y/o male returns for 5-month follow-up of left side abdominal pain (LUQ and LLQ).  Recent abdominal pelvic CT showed no abnormality to explain left-sided pain.  He takes dicyclomine as needed for abdominal cramping and diarrhea.  Suspect IBS-D.  1.  Left-sided abdominal pain (LUQ, LLQ); chronic intermittent abdominal cramping for many years.  Suspect IBS.  Continue dicyclomine 10 Mg 3 times daily as needed.  2.  Intermittent diarrhea.  Currently improved.  Suspect IBS-D.  Also evaluate for IBD. - Scheduling Colonoscopy.  3.  Mild intermittent Rectal Bleeding  Scheduling Colonoscopy I discussed risks of colonoscopy with patient to include risk of bleeding, colon perforation, and risk of sedation.  Patient expressed understanding and agrees to proceed with colonoscopy.  - SuPrep.  4.  Bladder Wall thickening  I advised pt. To f/u with PCP to check UA.  He may also need Urology evaluation.   Celso Amy, PA-C  Follow up As needed based on Colonoscopy results and GI symptoms.

## 2024-01-28 ENCOUNTER — Ambulatory Visit (INDEPENDENT_AMBULATORY_CARE_PROVIDER_SITE_OTHER): Payer: Medicare Other | Admitting: Physician Assistant

## 2024-01-28 ENCOUNTER — Encounter: Payer: Self-pay | Admitting: Physician Assistant

## 2024-01-28 ENCOUNTER — Encounter: Payer: Self-pay | Admitting: Family Medicine

## 2024-01-28 VITALS — BP 138/79 | HR 79 | Temp 97.9°F | Ht 77.0 in | Wt 219.6 lb

## 2024-01-28 DIAGNOSIS — N3289 Other specified disorders of bladder: Secondary | ICD-10-CM

## 2024-01-28 DIAGNOSIS — R197 Diarrhea, unspecified: Secondary | ICD-10-CM

## 2024-01-28 DIAGNOSIS — R1012 Left upper quadrant pain: Secondary | ICD-10-CM

## 2024-01-28 DIAGNOSIS — R140 Abdominal distension (gaseous): Secondary | ICD-10-CM

## 2024-01-28 DIAGNOSIS — N3090 Cystitis, unspecified without hematuria: Secondary | ICD-10-CM

## 2024-01-28 DIAGNOSIS — K529 Noninfective gastroenteritis and colitis, unspecified: Secondary | ICD-10-CM

## 2024-01-28 DIAGNOSIS — R1032 Left lower quadrant pain: Secondary | ICD-10-CM

## 2024-01-28 DIAGNOSIS — K625 Hemorrhage of anus and rectum: Secondary | ICD-10-CM | POA: Diagnosis not present

## 2024-01-28 MED ORDER — DICYCLOMINE HCL 10 MG PO CAPS
10.0000 mg | ORAL_CAPSULE | Freq: Three times a day (TID) | ORAL | 5 refills | Status: AC
Start: 2024-01-28 — End: ?

## 2024-01-28 MED ORDER — PEG 3350-KCL-NA BICARB-NACL 420 G PO SOLR: 4000.0000 mL | Freq: Once | ORAL | 0 refills | Status: AC

## 2024-01-28 MED ORDER — NA SULFATE-K SULFATE-MG SULF 17.5-3.13-1.6 GM/177ML PO SOLN: 1.0000 | Freq: Once | ORAL | 0 refills | Status: AC

## 2024-01-28 NOTE — Telephone Encounter (Signed)
I have placed orders for Urinalysis send out ([facility]) and Urine Culture. They are future orders.  He can come by anytime this week AM or PM for urine only lab.  He does not need an office visit w/ me yet. We will do the urine test first and based on results I will suggest scheduling in person or virtual with me OR a referral to Urologist. I already saw him for this and he has seen a GI specialist too.  I asked him when he can come in to assist with Lab scheduling. If you can follow-up with him to arrange his Urinalysis + Urine Culture orders  Thank you!  Saralyn Pilar, DO [facility] 01/28/2024, 6:54 PM

## 2024-01-30 ENCOUNTER — Other Ambulatory Visit: Payer: Self-pay | Admitting: Psychiatry

## 2024-01-30 DIAGNOSIS — F200 Paranoid schizophrenia: Secondary | ICD-10-CM

## 2024-01-31 ENCOUNTER — Encounter: Payer: Self-pay | Admitting: Psychiatry

## 2024-01-31 ENCOUNTER — Telehealth: Payer: Medicare Other | Admitting: Psychiatry

## 2024-01-31 DIAGNOSIS — F411 Generalized anxiety disorder: Secondary | ICD-10-CM

## 2024-01-31 DIAGNOSIS — F1010 Alcohol abuse, uncomplicated: Secondary | ICD-10-CM

## 2024-01-31 DIAGNOSIS — F200 Paranoid schizophrenia: Secondary | ICD-10-CM

## 2024-01-31 DIAGNOSIS — F17200 Nicotine dependence, unspecified, uncomplicated: Secondary | ICD-10-CM

## 2024-01-31 NOTE — Progress Notes (Signed)
 Virtual Visit via Video Note  I connected with Rodney [name] on 01/31/24 at  4:00 PM EST by a video enabled telemedicine application and verified that I am speaking with the correct person using two identifiers.  Location Provider Location : [facility] Patient Location : Home  Participants: Patient , Provider   I discussed the limitations of evaluation and management by telemedicine and the availability of in person appointments. The patient expressed understanding and agreed to proceed.   I discussed the assessment and treatment plan with the patient. The patient was provided an opportunity to ask questions and all were answered. The patient agreed with the plan and demonstrated an understanding of the instructions.   The patient was advised to call back or seek an in-person evaluation if the symptoms worsen or if the condition fails to improve as anticipated.    [facility] MD OP Progress Note  02/01/2024 10:15 AM Rodney [name]  MRN:  [id number]  Chief Complaint:  Chief Complaint  Patient presents with   Follow-up   Anxiety   Medication Refill   Schizophrenia   HPI: Rodney [name] is a 38 year old Caucasian male, employed, lives in [place], has a history of schizophrenia, GAD, alcohol use disorder, cannabis abuse in remission, tobacco use disorder was evaluated by telemedicine today.  He is currently taking venlafaxine  37.5 mg once daily in the morning with food, which he started recently due to side effects from Lexapro . He feels the medication is helping with his anxiety, and he continues to experience stress related to life events and work but feels he is managing it better. No symptoms of depression, sadness, hopelessness, or suicidal thoughts. He is also taking risperidone  and Depakote , and denies any side effects at this time.  Initially, he experienced diarrhea after starting venlafaxine , but this has since resolved.  Regarding his social habits, he reports social alcohol use on weekends and has  reduced his cigarette smoking from 30 to 10-12 cigarettes per day by using a vape. He acknowledges that vapes contain nicotine  and are not ideal but prefers them over cigarettes due to the absence of lingering smoke odor.  He mentions a follow-up appointment with a gastroenterologist who recommended a colonoscopy due to past stomach issues and noted bladder wall thickening on a CT scan. He has a urine test scheduled for tomorrow to further investigate this finding.  Denies hallucinations, paranoia, or suicidal thoughts.  Visit Diagnosis:    ICD-10-CM   1. Paranoid schizophrenia (HCC)  F20.0     2. GAD (generalized anxiety disorder)  F41.1     3. Alcohol use disorder, mild, abuse  F10.10     4. Tobacco use disorder  F17.200       Past Psychiatric History: I have reviewed past psychiatric history from progress note on 09/23/2018.  Past Medical History:  Past Medical History:  Diagnosis Date   Anxiety    Bipolar disorder (HCC)    CIGARETTE SMOKER 02/08/2011   Qualifier: Diagnosis of  By: Vicci MD, [place]     Seizures [facility])     Past Surgical History:  Procedure Laterality Date   WISDOM TOOTH EXTRACTION      Family Psychiatric History: I have reviewed family psychiatric history from progress note on 09/23/2018.  Family History:  Family History  Problem Relation Age of Onset   Anxiety disorder Mother    OCD Brother    Anxiety disorder Maternal Aunt    Anxiety disorder Maternal Grandmother    Anxiety disorder Paternal  Grandfather     Social History: I have reviewed social history from progress note on 09/23/2018. Social History   Socioeconomic History   Marital status: Significant Other    Spouse name: Not on file   Number of children: 1   Years of education: Not on file   Highest education level: Associate degree: occupational, scientist, product/process development, or vocational program  Occupational History   Occupation: location manager     Comment: armed forces operational officer    Occupation: Shipping and  receiving  Tobacco Use   Smoking status: Every Day    Current packs/day: 1.50    Average packs/day: 1.5 packs/day for 20.0 years (30.0 ttl pk-yrs)    Types: Cigarettes   Smokeless tobacco: Never   Tobacco comments:    1.5 PPD khj 12/06/2023  Vaping Use   Vaping status: Former  Substance and Sexual Activity   Alcohol use: Yes    Alcohol/week: 2.0 standard drinks of alcohol    Types: 2 Cans of beer per week    Comment: weekly   Drug use: Not Currently    Types: Marijuana, Other-see comments, LSD    Comment: in past   Sexual activity: Yes    Birth control/protection: None  Other Topics Concern   Not on file  Social History Narrative   Not on file   Social Drivers of Health   Financial Resource Strain: Medium Risk (10/16/2022)   Overall Financial Resource Strain (CARDIA)    Difficulty of Paying Living Expenses: Somewhat hard  Food Insecurity: No Food Insecurity (10/16/2022)   Hunger Vital Sign    Worried About Running Out of Food in the Last Year: Never true    Ran Out of Food in the Last Year: Never true  Transportation Needs: No Transportation Needs (10/16/2022)   PRAPARE - Administrator, Civil Service (Medical): No    Lack of Transportation (Non-Medical): No  Physical Activity: Inactive (10/16/2022)   Exercise Vital Sign    Days of Exercise per Week: 0 days    Minutes of Exercise per Session: 0 min  Stress: Not on file (11/05/2023)  Social Connections: Moderately Isolated (10/16/2022)   Social Connection and Isolation Panel [NHANES]    Frequency of Communication with Friends and Family: More than three times a week    Frequency of Social Gatherings with Friends and Family: More than three times a week    Attends Religious Services: Never    Database Administrator or Organizations: No    Attends Banker Meetings: Never    Marital Status: Living with partner    Allergies:  Allergies  Allergen Reactions   Hydroxyzine Hcl Rash     Metabolic Disorder Labs: Lab Results  Component Value Date   HGBA1C 5.3 06/05/2023   MPG 105.41 06/05/2023   MPG 105 03/04/2020   Lab Results  Component Value Date   PROLACTIN 11.4 06/05/2023   Lab Results  Component Value Date   CHOL 187 06/05/2023   TRIG 101 06/05/2023   HDL 65 06/05/2023   CHOLHDL 2.9 06/05/2023   VLDL 20 06/05/2023   LDLCALC 102 (H) 06/05/2023   LDLCALC 84 03/04/2020   Lab Results  Component Value Date   TSH 0.850 06/05/2023   TSH 3.543 05/24/2022    Therapeutic Level Labs: No results found for: LITHIUM Lab Results  Component Value Date   VALPROATE 64 06/05/2023   VALPROATE 61 05/24/2022   No results found for: CBMZ  Current Medications: Current Outpatient Medications  Medication  Sig Dispense Refill   albuterol  (PROAIR  HFA) 108 (90 Base) MCG/ACT inhaler Inhale 2 puffs into the lungs every 6 (six) hours as needed for wheezing or shortness of breath. 8.5 each 2   benztropine  (COGENTIN ) 1 MG tablet TAKE 1 TABLET (1 MG TOTAL) BY MOUTH 2 (TWO) TIMES DAILY. FOR SIDE EFFECTS OF RISPERIDONE  180 tablet 0   cyclobenzaprine  (FLEXERIL ) 10 MG tablet Take 1 tablet (10 mg total) by mouth 2 (two) times daily as needed for muscle spasms. 20 tablet 0   dicyclomine  (BENTYL ) 10 MG capsule Take 1 capsule (10 mg total) by mouth 4 (four) times daily -  before meals and at bedtime. 30 capsule 5   divalproex  (DEPAKOTE  ER) 250 MG 24 hr tablet TAKE 1 TABLET (250 MG TOTAL) BY MOUTH DAILY. TAKE ALONG WITH 500 MG , TOTAL DOSE OF 750 MG DAILY 90 tablet 0   divalproex  (DEPAKOTE  ER) 500 MG 24 hr tablet TAKE 1 TABLET (500 MG TOTAL) BY MOUTH DAILY. TAKE ALONG WITH 250 MG DAILY 90 tablet 0   Fluticasone -Umeclidin-Vilant (TRELEGY ELLIPTA ) 200-62.5-25 MCG/ACT AEPB Inhale 1 puff into the lungs daily. 60 each 11   gabapentin  (NEURONTIN ) 600 MG tablet TAKE 1 TABLET BY MOUTH THREE TIMES A DAY 270 tablet 0   predniSONE  (DELTASONE ) 20 MG tablet Take 2 tablets (40 mg total) by mouth  daily. 10 tablet 0   risperiDONE  (RISPERDAL ) 0.25 MG tablet Take 2 tablets (0.5 mg total) by mouth at bedtime. Has supplies for now     triamcinolone  (NASACORT ) 55 MCG/ACT AERO nasal inhaler PLACE 1 SPRAY INTO THE NOSE 2 (TWO) TIMES DAILY. 16.9 each 12   venlafaxine  XR (EFFEXOR  XR) 37.5 MG 24 hr capsule Take 1 capsule (37.5 mg total) by mouth daily with breakfast. 30 capsule 1   No current facility-administered medications for this visit.     Musculoskeletal: Strength & Muscle Tone:  UTA Gait & Station:  Seated Patient leans: N/A  Psychiatric Specialty Exam: Review of Systems  Psychiatric/Behavioral:  The patient is nervous/anxious.     There were no vitals taken for this visit.There is no height or weight on file to calculate BMI.  General Appearance: Fairly Groomed  Eye Contact:  Fair  Speech:  Clear and Coherent  Volume:  Normal  Mood:  Anxious  Affect:  Congruent  Thought Process:  Goal Directed and Descriptions of Associations: Intact  Orientation:  Full (Time, Place, and Person)  Thought Content: Logical   Suicidal Thoughts:  No  Homicidal Thoughts:  No  Memory:  Immediate;   Fair Recent;   Fair Remote;   Fair  Judgement:  Fair  Insight:  Fair  Psychomotor Activity:  Normal  Concentration:  Concentration: Fair and Attention Span: Fair  Recall:  [employer] of Knowledge: Fair  Language: Fair  Akathisia:  No  Handed:  Right  AIMS (if indicated): not done  Assets:  Desire for Improvement Housing Intimacy Social Support  ADL's:  Intact  Cognition: WNL  Sleep:  Fair   Screenings: Midwife Visit from 12/27/2023 in [facility] Office Visit from 10/10/2023 in [facility] Office Visit from 08/07/2023 in [facility] Office Visit from 02/07/2023 in [facility] Video Visit from 10/05/2022 in  [facility]  AIMS Total Score 0 0 0 0 0      AUDIT    Flowsheet  Row Video Visit from 08/01/2022 in [facility]  Alcohol Use Disorder Identification Test Final Score (AUDIT) 15      GAD-7    Flowsheet Row Office Visit from 12/27/2023 in [facility] Office Visit from 10/10/2023 in [facility] Office Visit from 10/02/2023 in [facility] Office Visit from 09/04/2023 in [facility] Office Visit from 08/07/2023 in [facility]  Total GAD-7 Score 8 9 10 11 5       PHQ2-9    Flowsheet Row Office Visit from 12/27/2023 in [facility] Office Visit from 10/10/2023 in [facility] Office Visit from 10/02/2023 in [facility] Office Visit from 09/04/2023 in [facility] Office Visit from 08/07/2023 in [facility]  PHQ-2 Total Score 0 0 1 0 0  PHQ-9 Total Score -- -- 1 1 0      Flowsheet Row Video Visit from 01/31/2024 in [facility] ED from 01/21/2024 in [facility] Visit from 12/27/2023 in [facility]  C-SSRS RISK CATEGORY No Risk No Risk No Risk        Assessment and Plan: Rodney [name] is a 38 year old Caucasian male, [place] in [place], has a history of schizophrenia, alcohol use disorder, cannabis abuse in remission, presents for a follow-up after recent initiation of venlafaxine  for mood symptoms, discussed assessment and plan as noted below.  Generalized Anxiety Disorder-improving Reports improvement with venlafaxine  37.5 mg daily, though still experiencing stress related  to life events and work. No depression or suicidal ideation. Initial side effect of diarrhea has resolved. Discussed potential dosage increase at next visit, which he is agreeable to. - Continue Venlafaxine  37.5 mg daily - Consider increasing Venlafaxine  dosage at next visit  Schizophrenia currently in remission Currently managed current medication regimen. - Continue Risperidone  0.5 mg at bedtime - Continue Benztropine  1 mg twice a day as needed for side effects of risperidone . - Continue Depakote  750 mg daily. ( Depakote  level - 06/05/23- 64 therapeutic)  Tobacco use disorder/Nicotine  Dependence Reduced cigarette smoking from 30 to 10-12 per day by using a vape with nicotine . Aware of vaping risks, including nicotine  dependence and potential lung harm. Prefers vaping due to less lingering smoke and better smell. - Continue efforts to reduce smoking, provided counseling for 1 minute. - Discuss further smoking cessation strategies at next visit  Alcohol use disorder mild-improving Limiting alcohol to weekends and special occasions. - Patient advised to limit use. - Patient advised not to mix alcohol with current medications.   Follow-up in clinic in 4 weeks or sooner if needed.   Consent: Patient/Guardian gives verbal consent for treatment and assignment of benefits for services provided during this visit. Patient/Guardian expressed understanding and agreed to proceed.   This note was generated in part or whole with voice recognition software. Voice recognition is usually quite accurate but there are transcription errors that can and very often do occur. I apologize for any typographical errors that were not detected and corrected.    Mariko Nowakowski, MD 02/01/2024, 10:15 AM

## 2024-02-01 ENCOUNTER — Other Ambulatory Visit: Payer: Self-pay | Admitting: Psychiatry

## 2024-02-01 ENCOUNTER — Other Ambulatory Visit: Payer: Medicare Other

## 2024-02-01 ENCOUNTER — Ambulatory Visit (INDEPENDENT_AMBULATORY_CARE_PROVIDER_SITE_OTHER): Payer: Medicare Other | Admitting: Professional Counselor

## 2024-02-01 DIAGNOSIS — F411 Generalized anxiety disorder: Secondary | ICD-10-CM | POA: Diagnosis not present

## 2024-02-01 DIAGNOSIS — N3090 Cystitis, unspecified without hematuria: Secondary | ICD-10-CM

## 2024-02-01 DIAGNOSIS — N3289 Other specified disorders of bladder: Secondary | ICD-10-CM | POA: Diagnosis not present

## 2024-02-01 NOTE — Progress Notes (Signed)
 Comprehensive Clinical Assessment (CCA) Note  02/01/2024 KISHON GARRIGA [id number]  Chief Complaint:  Chief Complaint  Patient presents with   Establish Care    I have some anxiety, that's part of it. 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 Smoking, gambling.    Visit Diagnosis: Generalized anxiety disorder    CCA Screening, Triage and Referral (STR)  Patient Reported Information How did you hear about us ? Other (Comment)  Referral name: Previous pt, Dr. Coby  Whom do you see for routine medical problems? Primary Care  Practice/Facility Name: [facility]  Name of Contact: Dr. Marolyn  What Is the Reason for Your Visit/Call Today? Establish therapy  How Long Has This Been Causing You Problems? > than 6 months  What Do You Feel Would Help You the Most Today? Tobacco/Nicotine  Dependence  Have You Recently Been in Any Inpatient Treatment (Hospital/Detox/Crisis Center/28-Day Program)? No (2010)  Have You Ever Received Services From [facility] Before? Yes  Who Do You See at [facility]? Dr. Coby  Have You Recently Had Any Thoughts About Hurting Yourself? No  Are You Planning to Commit Suicide/Harm Yourself At This time? No  Have you Recently Had Thoughts About Hurting Someone Sherral? No  Have You Used Any Alcohol or Drugs in the Past 24 Hours? No  How Long Ago Did You Use Drugs or Alcohol? Last weekend  What Did You Use and How Much? Alcohol   Do You Currently Have a Therapist/Psychiatrist? Yes  Name of Therapist/Psychiatrist: Dr. Coby  Have You Been Recently Discharged From Any Office Practice or Programs? No    CCA Screening Triage Referral Assessment Type of Contact: Tele-Assessment  Is this Initial or Reassessment? Initial Assessment  Collateral Involvement: None  Is CPS involved or ever been involved? Never  Is APS involved or ever been involved? Never  Patient Determined To Be At Risk for Harm To Self or Others Based on Review  of Patient Reported Information or Presenting Complaint? No  Are There Guns or Other Weapons in Your Home? No  Types of Guns/Weapons: No data recorded  Do You Have any Outstanding Charges, Pending Court Dates, Parole/Probation? DUI 2010  Location of Assessment: Telehealth  Does Patient Present under Involuntary Commitment? No  [place] of Residence: [place]  Patient Currently Receiving the Following Services: Medication Management  Determination of Need: Routine (7 days)  Options For Referral: Outpatient Therapy   CCA Biopsychosocial Intake/Chief Complaint:  Anxiety and addictions  Patient Reported Schizophrenia/Schizoaffective Diagnosis in Past: No  Strengths: I think I'm kind and caring. Pretty laid back. I have my moments but mostly laid back and understanding of things and people. I feel like I can read people and get the mindset they're in and relate to them.  Preferences: Individual.  Abilities: Basketball. I'd like to say golf but that's very wrong. I'm not good at it. Some video games.  Type of Services Patient Feels are Needed: I don't know. I guess coping skills.  Initial Clinical Notes/Concerns: No data recorded  Mental Health Symptoms Depression:  Fatigue; Change in energy/activity   Duration of Depressive symptoms: No data recorded  Mania:  Recklessness; Racing thoughts   Anxiety:   Restlessness; Worrying; Difficulty concentrating   Psychosis:  None (History of AVH I think it was drug-induced honestly.)   Duration of Psychotic symptoms: No data recorded  Trauma:  Hypervigilance   Obsessions:  None   Compulsions:  None   Inattention:  None   Hyperactivity/Impulsivity:  None  Oppositional/Defiant Behaviors:  None   Emotional Irregularity:  None   Other Mood/Personality Symptoms:  No data recorded   Mental Status Exam Appearance and self-care  Stature:  Average   Weight:  Average weight   Clothing:  Casual   Grooming:   Normal   Cosmetic use:  None   Posture/gait:  Normal   Motor activity:  Not Remarkable   Sensorium  Attention:  Normal   Concentration:  Normal   Orientation:  X5   Recall/memory:  Normal   Affect and Mood  Affect:  Appropriate   Mood:  Euthymic   Relating  Eye contact:  Normal   Facial expression:  Responsive   Attitude toward examiner:  Cooperative   Thought and Language  Speech flow: Clear and Coherent   Thought content:  Appropriate to Mood and Circumstances   Preoccupation:  None   Hallucinations:  None   Organization:  No data recorded  [employer] of Knowledge:  Fair   Intelligence:  Average   Abstraction:  Functional   Judgement:  Fair   Dance Movement Psychotherapist:  Realistic   Insight:  Fair   Decision Making:  Normal   Social Functioning  Social Maturity:  Responsible   Social Judgement:  Normal   Stress  Stressors:  Illness; Relationship; Transitions; Work (Recently split from long-term relationship. We were together for ten years and had one child. They moved out beginning of the year.)   Coping Ability:  Overwhelmed   Skill Deficits:  None   Supports:  Family; Friends/Service system (I got my parents still. I got a couple friends.)       02/01/2024   11:11 AM 12/27/2023    4:58 PM 10/10/2023    4:39 PM  Depression screen PHQ 2/9  Decreased Interest 0 0 0  Down, Depressed, Hopeless 0 0 0  PHQ - 2 Score 0 0 0  Altered sleeping 1    Tired, decreased energy 1    Change in appetite 0    Feeling bad or failure about yourself  0    Trouble concentrating 0    Moving slowly or fidgety/restless 0    Suicidal thoughts 0    PHQ-9 Score 2    Difficult doing work/chores Not difficult at all        02/01/2024   11:10 AM 12/27/2023    4:58 PM 10/10/2023    4:39 PM 10/02/2023    3:39 PM  GAD 7 : Generalized Anxiety Score  Nervous, Anxious, on Edge 1 2 2 2   Control/stop worrying 0 1 2 1   Worry too much - different things 1 1 2  2   Trouble relaxing 0 2 1 1   Restless 0 0 0 1  Easily annoyed or irritable 0 1 1 2   Afraid - awful might happen 0 1 1 1   Total GAD 7 Score 2 8 9 10   Anxiety Difficulty Not difficult at all  Somewhat difficult    Religion: Religion/Spirituality Are You A Religious Person?: Yes What is Your Religious Affiliation?: Non-Denominational  Leisure/Recreation: Leisure / Recreation Do You Have Hobbies?: Yes Leisure and Hobbies: Shoot pool, watch movies, play poker, sports, live music  Exercise/Diet: Exercise/Diet Do You Exercise?: No (Reports work is exercise) Have You Gained or Lost A Significant Amount of Weight in the Past Six Months?: No Do You Follow a Special Diet?: No Do You Have Any Trouble Sleeping?: No (Medications help with sleep)   CCA Employment/Education Employment/Work Situation: Employment /  Work Situation Employment Situation: Employed 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 Did You Attend Graduate School?: No Did You Have An Individualized Education Program (IIEP): No Did You Have Any Difficulty At School?: No Patient's Education Has Been Impacted by Current Illness: No   CCA Family/Childhood History Family and Relationship History: Family history Marital status: Single  (Recently ended 10 year relationship) Are you sexually active?: Yes (Occasionally) What is your sexual orientation?: Heterosexual Has your sexual activity been affected by drugs, alcohol, medication, or emotional stress?: No Does patient have children?: Yes How many children?: 1 How is patient's relationship with their children?: 1 biological children and two stepchildren from previous relationship, I think it's good with all of them. It seems like they're a little distance now, the two with the other father. They're 15 and 13. His daughter is 3.  Childhood History:  Childhood History By whom was/is the patient raised?: Both parents Additional childhood history information: Raised by both parents, describes childhood as wonderful. Description of patient's relationship with caregiver when they were a child: Mother - She was kinda the, she went through anxiety and stuff and she had a bit of a drinking problem. Not around me but when she went out. But we had a good relationship. Father - He was always laid back. I didn't see him as much as my mom. But he's a nice man, good man. Patient's description of current relationship with people who raised him/her: Mother - I still see her. I've been to church with her many times. She comes by and sees (daughter). Father - He [place] beach, almost a year, year and a half so I dont' see him much anymore. Does patient have siblings?: Yes Number of Siblings: 1 Description of patient's current relationship with siblings: 1 younger brother - I don't talk much with him. Kind of special occasions, every now and then. Did patient suffer any verbal/emotional/physical/sexual abuse as a child?: No Did patient suffer from severe childhood neglect?: No Has patient ever been sexually abused/assaulted/raped as an adolescent or adult?: No Was the patient ever a victim of a crime or a disaster?: No Witnessed domestic violence?: No Has patient been affected  by domestic violence as an adult?: No   CCA Substance Use Alcohol/Drug Use: Alcohol / Drug Use Pain Medications: See MAR Prescriptions: See MAR Over the Counter: See MAR History of alcohol / drug use?: Yes Negative Consequences of Use: Legal (DUI) Substance #1 Name of Substance 1: Alcohol 1 - Age of First Use: 14 1 - Amount (size/oz): Varies 1-8 beers 1 - Frequency: Currently only weekends, 1 - Duration: 5-6 months 1 - Last Use / Amount: Last Saturday 1 - Method of Aquiring: Legal 1- Route of Use: Oral Substance #2 Name of Substance 2: Marijuana 2 - Age of First  Use: 14 2 - Amount (size/oz): Gram 2 - Frequency: Was daily 2 - Duration: Years 2 - Last Use / Amount: April 2024 2 - Method of Aquiring: Street 2 - Route of Substance Use: Smoking Substance #3 Name of Substance 3: Psychodelics 3 - Age of First Use: 18 3 - Amount (size/oz): Varies 3 - Frequency: Weekly - Monthly 3 - Duration: 4-5 years 3 - Last Use / Amount: 15 years ago 3 - Method of Aquiring: Illegal 3 - Route of Substance Use: Oral  ASAM's:  Six Dimensions of Multidimensional Assessment  Dimension 1:  Acute Intoxication and/or Withdrawal Potential:      Dimension 2:  Biomedical Conditions and Complications:      Dimension 3:  Emotional, Behavioral, or Cognitive Conditions and Complications:     Dimension 4:  Readiness to Change:     Dimension 5:  Relapse, Continued use, or Continued Problem Potential:     Dimension 6:  Recovery/Living Environment:     ASAM Severity Score:    ASAM Recommended Level of Treatment:     Substance use Disorder (SUD) N/A   Recommendations for Services/Supports/Treatments: N/A   DSM5 Diagnoses: Patient Active Problem List   Diagnosis Date Noted   Memory loss 05/23/2022   High risk medication use 05/23/2022   Bladder wall thickening 08/06/2020   GAD (generalized anxiety disorder) 09/02/2019   Cannabis use disorder, mild, in sustained remission 09/02/2019   Alcohol use  disorder, moderate, dependence (HCC) 02/18/2019   Asthma with COPD (HCC) 09/26/2018   Paranoid schizophrenia (HCC) 02/05/2018   Vitamin D deficiency 08/09/2015   Reactive airway disease with status asthmaticus 08/09/2015   Cigarette nicotine  dependence with nicotine -induced disorder 09/14/2014   Alcohol use disorder, mild, abuse 02/08/2011   Tobacco use disorder 02/08/2011   Allergic rhinitis 02/08/2011   SCHIZOPHRENIA, HX OF 02/08/2011   Referrals to Alternative Service(s): Referred to Alternative Service(s):   Place:   Date:   Time:    Referred to Alternative Service(s):   Place:   Date:   Time:    Referred to Alternative Service(s):   Place:   Date:   Time:    Referred to Alternative Service(s):   Place:   Date:   Time:      Collaboration of Care: Medication Management AEB chart review  Summary: Carter is a single 38 y.o. Caucasian male. He presents to [facility] via telehealth services to re-establish outpatient therapy services. Lj is already engaged in medication management with Dr. Coby, initially evaluated on 09/23/2018 and last seen on 01/31/2024. He was previously engaged in therapy with Larraine Banks in 2019. Bondurant reports his main issues are with anxiety and addictive behaviors. He reports smoking and gambling currently. He notes a history of other substance use but has been able to cut back drinking to the weekends.  Dearion appears alert and oriented x5. He is casually dressed and appears appropriately groomed. His mood is calm and cooperative. His speech is normal in tone/volume; thought content/process is logical and linear. He denies current SI/HI/AVH. He notes a history of AVH but feels it was substance induced. Khalil notes a history of psychodelic and cannabis use. He denies any major trauma/abuse or trauma symptoms. Alister reports a history of inpatient hospitalizations, around 2010, which coincides with his diagnosis of paranoid schizophrenia, but also his drug use. He reports some  ongoing hypervigilance and trouble trusting others.  Kyzer was raised by his parents. He states his childhood was wonderful. He reports his mother had  some issues herself, including a drinking problem, but they had a good relationship. They remain close today. He reports his father wasn't as close but his father is a nice, good man. His father [place] so they don't see each other as often. Brantly has a younger brother but they aren't close. He has never married, but has one daughter, age 36. He reports he was in a 10-year relationship with her mother, who also had two children from previous relationships. He notes they separated at the beginning of this year and that has contributed to some decline in his mental health. Lael reports he has support from family and friends.   Riggin did not complete high school but did obtain a GED. Additionally, he obtained an associate's degree in designer, fashion/clothing. 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 He's been there for two years. 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 Toluwani identifies hobbies in shooting pool, watching movies, playing poker, watching sports, and live music.   Sameul meets criteria for F41.1 Generalized anxiety disorder AEB excessive anxiety or worry occurring more days than not for at least 6 months; restlessness, fatigue, difficulty concentrating, irritability, muscle tension, and sleep disturbance which causes significant distress or impairment in social, occupational, or other important areas of functioning.  Recommendations: Bell is recommended to continue with medication management and engage in outpatient therapy. He is in agreement with these recommendations.  Patient/Guardian was advised Release of Information must be obtained prior to any record release in order to collaborate their care with an outside provider. Patient/Guardian was advised  if they have not already done so to contact the registration department to sign all necessary forms in order for us  to release information regarding their care.   Consent: Patient/Guardian gives verbal consent for treatment and assignment of benefits for services provided during this visit. Patient/Guardian expressed understanding and agreed to proceed.   [name], LCMHC

## 2024-02-02 LAB — URINE CULTURE
MICRO NUMBER:: 16057113
Result:: NO GROWTH
SPECIMEN QUALITY:: ADEQUATE

## 2024-02-02 LAB — URINALYSIS, ROUTINE W REFLEX MICROSCOPIC
Bacteria, UA: NONE SEEN /[HPF]
Bilirubin Urine: NEGATIVE
Glucose, UA: NEGATIVE
Hgb urine dipstick: NEGATIVE
Hyaline Cast: NONE SEEN /[LPF]
Ketones, ur: NEGATIVE
Nitrite: NEGATIVE
Protein, ur: NEGATIVE
RBC / HPF: NONE SEEN /[HPF] (ref 0–2)
Specific Gravity, Urine: 1.005 (ref 1.001–1.035)
Squamous Epithelial / HPF: NONE SEEN /[HPF] (ref <=5)
pH: 6 (ref 5.0–8.0)

## 2024-02-02 LAB — MICROSCOPIC MESSAGE

## 2024-02-04 ENCOUNTER — Encounter: Payer: Self-pay | Admitting: Family Medicine

## 2024-02-05 ENCOUNTER — Other Ambulatory Visit: Payer: Self-pay | Admitting: Family Medicine

## 2024-02-05 DIAGNOSIS — N3289 Other specified disorders of bladder: Secondary | ICD-10-CM

## 2024-02-16 ENCOUNTER — Other Ambulatory Visit: Payer: Self-pay | Admitting: Psychiatry

## 2024-02-16 DIAGNOSIS — F200 Paranoid schizophrenia: Secondary | ICD-10-CM

## 2024-02-21 ENCOUNTER — Other Ambulatory Visit: Payer: Self-pay | Admitting: Psychiatry

## 2024-02-21 DIAGNOSIS — F200 Paranoid schizophrenia: Secondary | ICD-10-CM

## 2024-02-21 DIAGNOSIS — F411 Generalized anxiety disorder: Secondary | ICD-10-CM

## 2024-02-25 NOTE — Progress Notes (Unsigned)
 Virtual Visit via Video Note  I connected with Rodney [name] on 02/27/24 at  1:20 PM EST by a video enabled telemedicine application and verified that I am speaking with the correct person using two identifiers.  Location Provider Location : [facility] Patient Location : Work  Participants: Patient , Provider   I discussed the limitations of evaluation and management by telemedicine and the availability of in person appointments. The patient expressed understanding and agreed to proceed.    I discussed the assessment and treatment plan with the patient. The patient was provided an opportunity to ask questions and all were answered. The patient agreed with the plan and demonstrated an understanding of the instructions.   The patient was advised to call back or seek an in-person evaluation if the symptoms worsen or if the condition fails to improve as anticipated.   BH MD OP Progress Note  02/28/2024 12:45 PM HUTCH RHETT  MRN:  [id number]  Chief Complaint:  Chief Complaint  Patient presents with   Follow-up   Schizophrenia   Anxiety   Medication Refill   HPI: Rodney [name] is a 38 year old Caucasian male, employed, separated from wife, lives in [place], has a history of schizophrenia, GAD, alcohol use disorder, cannabis abuse in remission, tobacco use disorder was evaluated in office today.  He is currently taking venlafaxine 37.5 mg, which has significantly alleviated his anxiety symptoms, making him feel more alert and less overwhelmed. He is uncertain about the need for a dosage increase as his anxiety is well-controlled and does not interfere with daily functioning. He denies any suicidal thoughts or thoughts of harming himself or others and reports sleeping well.  He has a history of schizophrenia, which is in remission with risperidone, Depakote, and Cogentin. He denies auditory hallucinations or suicidal ideations. He is engaged in therapy, having attended an initial session and planning  to continue with more frequent sessions initially, then spacing them out.  He mentions an issue with refilling benztropine, which was sent to [facility] but required approval for refill. He believes he has enough risperidone and is managing his medications well overall.  Socially, he recently separated from his partner in December due to a mutual decision stemming from a lack of romance and frequent arguments. He has one biological daughter and two stepdaughters whom he has helped raise for ten years. He maintains contact with them through FaceTime and sees his biological daughter on weekends.  He reports moderate to mild alcohol use, primarily on Fridays, as he spends weekends with his daughter. He has a history of tobacco use, noting that he had reduced smoking by vaping but has recently increased smoking again. He wants to attempt reducing smoking once more.  Visit Diagnosis:    ICD-10-CM   1. Paranoid schizophrenia (HCC)  F20.0     2. GAD (generalized anxiety disorder)  F41.1     3. Alcohol use disorder, mild, abuse  F10.10     4. Tobacco use disorder  F17.200       Past Psychiatric History: I have reviewed past psychiatric history from progress note on 09/23/2018.  Past Medical History:  Past Medical History:  Diagnosis Date   Anxiety    Asthma    Bipolar disorder (HCC)    CIGARETTE SMOKER 02/08/2011   Qualifier: Diagnosis of  By: Laural Benes MD, [place]     Seizures [facility])     Past Surgical History:  Procedure Laterality Date   WISDOM TOOTH EXTRACTION  Family Psychiatric History: I have reviewed family psychiatric history from progress note on 09/23/2018.  Family History:  Family History  Problem Relation Age of Onset   Anxiety disorder Mother    OCD Brother    Anxiety disorder Maternal Aunt    Anxiety disorder Maternal Grandmother    Anxiety disorder Paternal Grandfather     Social History: I have reviewed social history from progress note on 09/23/2018. 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    Occupation: Shipping and receiving  Tobacco Use   Smoking status: Every Day    Current packs/day: 1.50    Average packs/day: 1.5 packs/day for 20.0 years (30.0 ttl pk-yrs)    Types: Cigarettes   Smokeless tobacco: Never   Tobacco comments:    1.5 PPD khj 12/06/2023  Vaping Use   Vaping status: Former  Substance and Sexual Activity   Alcohol use: Yes    Alcohol/week: 2.0 standard drinks of alcohol    Types: 2 Cans of beer per week    Comment: weekly.none last 24hrs   Drug use: Not Currently    Types: Marijuana, Other-see comments, LSD    Comment: in past   Sexual activity: Yes    Birth control/protection: None  Other Topics Concern   Not on file  Social History Narrative   Not on file   Social Drivers of Health   Financial Resource Strain: Low Risk  (02/01/2024)   Overall Financial Resource Strain (CARDIA)    Difficulty of Paying Living Expenses: Not very hard  Food Insecurity: No Food Insecurity (02/01/2024)   Hunger Vital Sign    Worried About Running Out of Food in the Last Year: Never true    Ran Out of Food in the Last Year: Never true  Transportation Needs: No Transportation Needs (02/01/2024)   PRAPARE - Administrator, Civil Service (Medical): No    Lack of Transportation (Non-Medical): No  Physical Activity: Sufficiently Active (02/01/2024)   Exercise Vital Sign    Days of Exercise per Week: 5 days    Minutes of Exercise per Session: 120 min  Stress: Stress Concern Present (02/01/2024)   [employer] of Occupational Health - Occupational Stress Questionnaire    Feeling of Stress : To some extent  Social Connections: Moderately  Isolated (02/01/2024)   Social Connection and Isolation Panel [NHANES]    Frequency of Communication with Friends and Family: More than three times a week    Frequency of Social Gatherings with Friends and Family: Three times a week    Attends Religious Services: More than 4 times per year    Active Member of Clubs or Organizations: No    Attends Banker Meetings: Never    Marital Status: Never married    Allergies:  Allergies  Allergen Reactions   Hydroxyzine Hcl Rash    Metabolic Disorder Labs: Lab Results  Component Value Date   HGBA1C 5.3 06/05/2023   MPG 105.41 06/05/2023   MPG 105 03/04/2020   Lab Results  Component Value Date   PROLACTIN 11.4 06/05/2023   Lab Results  Component Value Date   CHOL 187 06/05/2023   TRIG 101 06/05/2023  HDL 65 06/05/2023   CHOLHDL 2.9 06/05/2023   VLDL 20 06/05/2023   LDLCALC 102 (H) 06/05/2023   LDLCALC 84 03/04/2020   Lab Results  Component Value Date   TSH 0.850 06/05/2023   TSH 3.543 05/24/2022    Therapeutic Level Labs: No results found for: "LITHIUM" Lab Results  Component Value Date   VALPROATE 64 06/05/2023   VALPROATE 61 05/24/2022   No results found for: "CBMZ"  Current Medications: Current Outpatient Medications  Medication Sig Dispense Refill   Na Sulfate-K Sulfate-Mg Sulfate concentrate (SUPREP) 17.5-3.13-1.6 GM/177ML SOLN See admin instructions.     albuterol (PROAIR HFA) 108 (90 Base) MCG/ACT inhaler Inhale 2 puffs into the lungs every 6 (six) hours as needed for wheezing or shortness of breath. 8.5 each 2   benztropine (COGENTIN) 1 MG tablet TAKE 1 TABLET (1 MG TOTAL) BY MOUTH 2 (TWO) TIMES DAILY. FOR SIDE EFFECTS OF RISPERIDONE 180 tablet 0   cyclobenzaprine (FLEXERIL) 10 MG tablet Take 1 tablet (10 mg total) by mouth 2 (two) times daily as needed for muscle spasms. 20 tablet 0   dicyclomine (BENTYL) 10 MG capsule Take 1 capsule (10 mg total) by mouth 4 (four) times daily -  before meals and  at bedtime. 30 capsule 5   divalproex (DEPAKOTE ER) 250 MG 24 hr tablet TAKE 1 TABLET (250 MG TOTAL) BY MOUTH DAILY. TAKE ALONG WITH 500 MG , TOTAL DOSE OF 750 MG DAILY 90 tablet 0   divalproex (DEPAKOTE ER) 500 MG 24 hr tablet TAKE 1 TABLET (500 MG TOTAL) BY MOUTH DAILY. TAKE ALONG WITH 250 MG DAILY 90 tablet 0   Fluticasone-Umeclidin-Vilant (TRELEGY ELLIPTA) 200-62.5-25 MCG/ACT AEPB Inhale 1 puff into the lungs daily. 60 each 11   gabapentin (NEURONTIN) 600 MG tablet TAKE 1 TABLET BY MOUTH THREE TIMES A DAY 270 tablet 0   predniSONE (DELTASONE) 20 MG tablet Take 2 tablets (40 mg total) by mouth daily. (Patient not taking: Reported on 02/28/2024) 10 tablet 0   risperiDONE (RISPERDAL) 0.25 MG tablet Take 2 tablets (0.5 mg total) by mouth at bedtime. Has supplies for now     triamcinolone (NASACORT) 55 MCG/ACT AERO nasal inhaler PLACE 1 SPRAY INTO THE NOSE 2 (TWO) TIMES DAILY. 16.9 each 12   venlafaxine XR (EFFEXOR-XR) 37.5 MG 24 hr capsule TAKE 1 CAPSULE BY MOUTH DAILY WITH BREAKFAST. 90 capsule 0   No current facility-administered medications for this visit.   Facility-Administered Medications Ordered in Other Visits  Medication Dose Route Frequency Provider Last Rate Last Admin   0.9 %  sodium chloride infusion   Intravenous Continuous [name], MD 20 mL/hr at 02/28/24 1610 Continued from Pre-op at 02/28/24 0904     Musculoskeletal: Strength & Muscle Tone:  UTA Gait & Station:  Seated Patient leans: N/A  Psychiatric Specialty Exam: Review of Systems  Psychiatric/Behavioral:  The patient is nervous/anxious.     There were no vitals taken for this visit.There is no height or weight on file to calculate BMI.  General Appearance: Casual  Eye Contact:  Fair  Speech:  Clear and Coherent  Volume:  Normal  Mood:  Anxious improving  Affect:  Full Range  Thought Process:  Goal Directed and Descriptions of Associations: Intact  Orientation:  Full (Time, Place, and Person)  Thought  Content: Logical   Suicidal Thoughts:  No  Homicidal Thoughts:  No  Memory:  Immediate;   Fair Recent;   Fair Remote;   Fair  Judgement:  Fair  Insight:  Fair  Psychomotor Activity:  Normal  Concentration:  Concentration: Fair and Attention Span: Fair  Recall:  [employer] of Knowledge: Fair  Language: Fair  Akathisia:  No  Handed:  Right  AIMS (if indicated): not done  Assets:  Desire for Improvement Housing Social Support Talents/Skills Transportation  ADL's:  Intact  Cognition: WNL  Sleep:  Fair   Screenings: Midwife Visit from 12/27/2023 in [facility] Office Visit from 10/10/2023 in [facility] Office Visit from 08/07/2023 in [facility] Office Visit from 02/07/2023 in [facility] Video Visit from 10/05/2022 in [facility]  AIMS Total Score 0 0 0 0 0      AUDIT    Flowsheet Row Video Visit from 08/01/2022 in [facility]  Alcohol Use Disorder Identification Test Final Score (AUDIT) 15      GAD-7    Flowsheet Row Counselor from 02/01/2024 in [facility] Office Visit from 12/27/2023 in [facility] Office Visit from 10/10/2023 in [facility] Office Visit from 10/02/2023 in [facility] Office Visit from 09/04/2023 in [facility]  Total GAD-7 Score 2 8 9 10 11       PHQ2-9    Flowsheet Row Counselor from 02/01/2024 in [facility] Office Visit from 12/27/2023 in [facility] Office Visit from 10/10/2023 in [facility] Office Visit  from 10/02/2023 in [facility] Office Visit from 09/04/2023 in [facility]  PHQ-2 Total Score 0 0 0 1 0  PHQ-9 Total Score 2 -- -- 1 1      Flowsheet Row Video Visit from 02/27/2024 in [facility] Counselor from 02/01/2024 in [facility] Video Visit from 01/31/2024 in [facility]  C-SSRS RISK CATEGORY No Risk No Risk No Risk        Assessment and Plan: PAPA PIERCEFIELD is a 38 year old Caucasian male, lives in [place], has a history of schizophrenia, alcohol use disorder, cannabis abuse in remission, was evaluated in office today, discussed assessment and plan as noted below.  Schizophrenia in remission Schizophrenia is in remission with current medication regimen of risperidone, Depakote, and Cogentin. No auditory hallucinations or suicidal ideation. Benztropine prescription issue addressed. - Continue Risperidone 0.5 mg at bedtime - Continue Benztropine 1 mg twice a day as needed for side effects of risperidone - Continue Depakote 750 mg daily (Depakote level-06/05/2023-64-therapeutic)  Generalized Anxiety Disorder-improving On venlafaxine 37.5 mg for anxiety symptoms. Reports improvement, feeling more in control and less overwhelmed. No significant anxiety symptoms affecting daily functioning. Sleeping well. No need to increase the dose as symptoms are manageable. - Continue Venlafaxine 37.5 mg daily - Continue therapy sessions as scheduled with Ms. Pricilla Loveless  Alcohol Use Disorder mild-improving Alcohol use is moderate to mild, primarily on weekends. Reduced consumption to mostly Fridays due to increased responsibilities with daughter on weekends. - Encourage reduction in alcohol consumption  Tobacco Use Disorder-unstable Attempted to cut back on smoking by using vaping but experienced a relapse. Plans to attempt reduction  again. - Encourage smoking cessation efforts - Not interested in any prescription medications at  this time.  Provided counseling for 1 minute.   Follow-up - Follow-up in clinic in 6 to 7 weeks or sooner if needed.   Collaboration of Care: Collaboration of Care: Referral or follow-up with counselor/therapist AEB patient encouraged to continue CBT  Patient/Guardian was advised Release of Information must be obtained prior to any record release in order to collaborate their care with an outside provider. Patient/Guardian was advised if they have not already done so to contact the registration department to sign all necessary forms in order for [place] to release information regarding their care.   Consent: Patient/Guardian gives verbal consent for treatment and assignment of benefits for services provided during this visit. Patient/Guardian expressed understanding and agreed to proceed.  Discussed the use of a AI scribe software for clinical note transcription with the patient, who gave verbal consent to proceed.  This note was generated in part or whole with voice recognition software. Voice recognition is usually quite accurate but there are transcription errors that can and very often do occur. I apologize for any typographical errors that were not detected and corrected.     Jomarie Longs, MD 02/28/2024, 12:45 PM

## 2024-02-26 ENCOUNTER — Other Ambulatory Visit: Payer: Self-pay | Admitting: Psychiatry

## 2024-02-26 DIAGNOSIS — F200 Paranoid schizophrenia: Secondary | ICD-10-CM

## 2024-02-27 ENCOUNTER — Encounter: Payer: Self-pay | Admitting: Psychiatry

## 2024-02-27 ENCOUNTER — Telehealth (INDEPENDENT_AMBULATORY_CARE_PROVIDER_SITE_OTHER): Payer: Medicare Other | Admitting: Psychiatry

## 2024-02-27 DIAGNOSIS — F17210 Nicotine dependence, cigarettes, uncomplicated: Secondary | ICD-10-CM | POA: Diagnosis not present

## 2024-02-27 DIAGNOSIS — F411 Generalized anxiety disorder: Secondary | ICD-10-CM

## 2024-02-27 DIAGNOSIS — F1010 Alcohol abuse, uncomplicated: Secondary | ICD-10-CM | POA: Diagnosis not present

## 2024-02-27 DIAGNOSIS — F17200 Nicotine dependence, unspecified, uncomplicated: Secondary | ICD-10-CM

## 2024-02-27 DIAGNOSIS — F200 Paranoid schizophrenia: Secondary | ICD-10-CM | POA: Diagnosis not present

## 2024-02-28 ENCOUNTER — Other Ambulatory Visit: Payer: Self-pay

## 2024-02-28 ENCOUNTER — Ambulatory Visit
Admission: RE | Admit: 2024-02-28 | Discharge: 2024-02-28 | Disposition: A | Discharge status: Home / Self Care | Payer: Medicare Other | Attending: Gastroenterology | Admitting: Gastroenterology

## 2024-02-28 ENCOUNTER — Encounter
Admission: RE | Disposition: A | Discharge status: Home / Self Care | Payer: Self-pay | Source: Home / Self Care | Attending: Gastroenterology

## 2024-02-28 ENCOUNTER — Ambulatory Visit: Admitting: Anesthesiology

## 2024-02-28 ENCOUNTER — Ambulatory Visit: Payer: Medicare Other | Admitting: Professional Counselor

## 2024-02-28 ENCOUNTER — Encounter: Payer: Self-pay | Admitting: Gastroenterology

## 2024-02-28 DIAGNOSIS — K625 Hemorrhage of anus and rectum: Secondary | ICD-10-CM | POA: Diagnosis not present

## 2024-02-28 DIAGNOSIS — K635 Polyp of colon: Secondary | ICD-10-CM

## 2024-02-28 DIAGNOSIS — K640 First degree hemorrhoids: Secondary | ICD-10-CM | POA: Insufficient documentation

## 2024-02-28 DIAGNOSIS — Z7951 Long term (current) use of inhaled steroids: Secondary | ICD-10-CM | POA: Insufficient documentation

## 2024-02-28 DIAGNOSIS — F17210 Nicotine dependence, cigarettes, uncomplicated: Secondary | ICD-10-CM | POA: Insufficient documentation

## 2024-02-28 DIAGNOSIS — D122 Benign neoplasm of ascending colon: Secondary | ICD-10-CM | POA: Insufficient documentation

## 2024-02-28 DIAGNOSIS — D123 Benign neoplasm of transverse colon: Secondary | ICD-10-CM | POA: Insufficient documentation

## 2024-02-28 DIAGNOSIS — D124 Benign neoplasm of descending colon: Secondary | ICD-10-CM | POA: Diagnosis not present

## 2024-02-28 DIAGNOSIS — D126 Benign neoplasm of colon, unspecified: Secondary | ICD-10-CM

## 2024-02-28 DIAGNOSIS — J45909 Unspecified asthma, uncomplicated: Secondary | ICD-10-CM | POA: Diagnosis not present

## 2024-02-28 DIAGNOSIS — K649 Unspecified hemorrhoids: Secondary | ICD-10-CM | POA: Diagnosis not present

## 2024-02-28 HISTORY — DX: Unspecified asthma, uncomplicated: J45.909

## 2024-02-28 HISTORY — PX: COLONOSCOPY WITH PROPOFOL: SHX5780

## 2024-02-28 SURGERY — COLONOSCOPY WITH PROPOFOL
Anesthesia: General

## 2024-02-28 MED ORDER — SODIUM CHLORIDE 0.9 % IV SOLN: INTRAVENOUS | Status: DC

## 2024-02-28 MED ORDER — PROPOFOL 10 MG/ML IV BOLUS
INTRAVENOUS | Status: DC | PRN
Start: 2024-02-28 — End: 2024-02-28
  Administered 2024-02-28: 80 mg via INTRAVENOUS

## 2024-02-28 MED ORDER — PROPOFOL 500 MG/50ML IV EMUL
INTRAVENOUS | Status: DC | PRN
  Administered 2024-02-28: 140 ug/kg/min via INTRAVENOUS

## 2024-02-28 NOTE — Anesthesia Postprocedure Evaluation (Signed)
 Anesthesia Post Note  Patient: Rodney [name]  Procedure(s) Performed: COLONOSCOPY WITH PROPOFOL  Patient location during evaluation: Endoscopy Anesthesia Type: General Level of consciousness: awake and alert Pain management: pain level controlled Vital Signs Assessment: post-procedure vital signs reviewed and stable Respiratory status: spontaneous breathing, nonlabored ventilation, respiratory function stable and patient connected to nasal cannula oxygen Cardiovascular status: blood pressure returned to baseline and stable Postop Assessment: no apparent nausea or vomiting Anesthetic complications: no   No notable events documented.   Last Vitals:  Vitals:   02/28/24 0947 02/28/24 0957  BP:    Pulse: (!) 59 (!) 50  Resp:    Temp:    SpO2: 99% 100%    Last Pain:  Vitals:   02/28/24 0957  TempSrc:   PainSc: 0-No pain                 Lenard Simmer

## 2024-02-28 NOTE — H&P (Signed)
 [name], MD [address], [address], [place], [place], [address] [address], [address], [place], [place], [address] Phone: [phone]  Fax: [phone]  Primary Care Physician:  Smitty Cords, DO   Pre-Procedure History & Physical: HPI:  Rodney [name] is a 38 y.o. male is here for an colonoscopy.   Past Medical History:  Diagnosis Date   Anxiety    Asthma    Bipolar disorder (HCC)    CIGARETTE SMOKER 02/08/2011   Qualifier: Diagnosis of  By: Laural Benes MD, [place]     Seizures [facility])     Past Surgical History:  Procedure Laterality Date   WISDOM TOOTH EXTRACTION      Prior to Admission medications   Medication Sig Start Date End Date Taking? Authorizing Provider  benztropine (COGENTIN) 1 MG tablet TAKE 1 TABLET (1 MG TOTAL) BY MOUTH 2 (TWO) TIMES DAILY. FOR SIDE EFFECTS OF RISPERIDONE 02/27/24  Yes Jomarie Longs, MD  dicyclomine (BENTYL) 10 MG capsule Take 1 capsule (10 mg total) by mouth 4 (four) times daily -  before meals and at bedtime. 01/28/24  Yes Celso Amy, PA-C  divalproex (DEPAKOTE ER) 500 MG 24 hr tablet TAKE 1 TABLET (500 MG TOTAL) BY MOUTH DAILY. TAKE ALONG WITH 250 MG DAILY 02/18/24  Yes Jomarie Longs, MD  Fluticasone-Umeclidin-Vilant (TRELEGY ELLIPTA) 200-62.5-25 MCG/ACT AEPB Inhale 1 puff into the lungs daily. 12/06/23  Yes Salena Saner, MD  gabapentin (NEURONTIN) 600 MG tablet TAKE 1 TABLET BY MOUTH THREE TIMES A DAY 01/24/24  Yes Karamalegos, Netta Neat, DO  Na Sulfate-K Sulfate-Mg Sulfate concentrate (SUPREP) 17.5-3.13-1.6 GM/177ML SOLN See admin instructions. 01/28/24  Yes [provider]  risperiDONE (RISPERDAL) 0.25 MG tablet Take 2 tablets (0.5 mg total) by mouth at bedtime. Has supplies for now 10/10/23  Yes Eappen, Saramma, MD  venlafaxine XR (EFFEXOR-XR) 37.5 MG 24 hr capsule TAKE 1 CAPSULE BY MOUTH DAILY WITH BREAKFAST. 02/04/24  Yes Jomarie Longs, MD  albuterol (PROAIR HFA) 108 (90 Base) MCG/ACT inhaler Inhale 2  puffs into the lungs every 6 (six) hours as needed for wheezing or shortness of breath. 12/06/23   Salena Saner, MD  cyclobenzaprine (FLEXERIL) 10 MG tablet Take 1 tablet (10 mg total) by mouth 2 (two) times daily as needed for muscle spasms. 01/21/24   White, Elita Boone, NP  divalproex (DEPAKOTE ER) 250 MG 24 hr tablet TAKE 1 TABLET (250 MG TOTAL) BY MOUTH DAILY. TAKE ALONG WITH 500 MG , TOTAL DOSE OF 750 MG DAILY 02/22/24   Jomarie Longs, MD  predniSONE (DELTASONE) 20 MG tablet Take 2 tablets (40 mg total) by mouth daily. Patient not taking: Reported on 02/28/2024 01/21/24   Valinda Hoar, NP  triamcinolone (NASACORT) 55 MCG/ACT AERO nasal inhaler PLACE 1 SPRAY INTO THE NOSE 2 (TWO) TIMES DAILY. 04/17/22   Salena Saner, MD    Allergies as of 01/28/2024 - Review Complete 01/28/2024  Allergen Reaction Noted   Hydroxyzine hcl Rash 07/09/2013    Family History  Problem Relation Age of Onset   Anxiety disorder Mother    OCD Brother    Anxiety disorder Maternal Aunt    Anxiety disorder Maternal Grandmother    Anxiety disorder Paternal Grandfather     Social History   Socioeconomic History   Marital status: Significant Other    Spouse name: Not on file   Number of children: 1   Years of education: Not on file   Highest education level: Associate degree:  occupational, Scientist, product/process development, or vocational program  Occupational History   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    Occupation: Shipping and receiving  Tobacco Use   Smoking status: Every Day    Current packs/day: 1.50    Average packs/day: 1.5 packs/day for 20.0 years (30.0 ttl pk-yrs)    Types: Cigarettes   Smokeless tobacco: Never   Tobacco comments:    1.5 PPD khj 12/06/2023  Vaping Use   Vaping status: Former  Substance and Sexual Activity   Alcohol use: Yes    Alcohol/week: 2.0 standard drinks of alcohol    Types: 2 Cans of beer per week    Comment: weekly.none last 24hrs   Drug use: Not  Currently    Types: Marijuana, Other-see comments, LSD    Comment: in past   Sexual activity: Yes    Birth control/protection: None  Other Topics Concern   Not on file  Social History Narrative   Not on file   Social Drivers of Health   Financial Resource Strain: Low Risk  (02/01/2024)   Overall Financial Resource Strain (CARDIA)    Difficulty of Paying Living Expenses: Not very hard  Food Insecurity: No Food Insecurity (02/01/2024)   Hunger Vital Sign    Worried About Running Out of Food in the Last Year: Never true    Ran Out of Food in the Last Year: Never true  Transportation Needs: No Transportation Needs (02/01/2024)   PRAPARE - Administrator, Civil Service (Medical): No    Lack of Transportation (Non-Medical): No  Physical Activity: Sufficiently Active (02/01/2024)   Exercise Vital Sign    Days of Exercise per Week: 5 days    Minutes of Exercise per Session: 120 min  Stress: Stress Concern Present (02/01/2024)   [employer] of Occupational Health - Occupational Stress Questionnaire    Feeling of Stress : To some extent  Social Connections: Moderately Isolated (02/01/2024)   Social Connection and Isolation Panel [NHANES]    Frequency of Communication with Friends and Family: More than three times a week    Frequency of Social Gatherings with Friends and Family: Three times a week    Attends Religious Services: More than 4 times per year    Active Member of Clubs or Organizations: No    Attends Banker Meetings: Never    Marital Status: Never married  Intimate Partner Violence: Not At Risk (02/01/2024)   Humiliation, Afraid, Rape, and Kick questionnaire    Fear of Current or Ex-Partner: No    Emotionally Abused: No    Physically Abused: No    Sexually Abused: No    Review of Systems: See HPI, otherwise negative ROS  Physical Exam: There were no vitals taken for this visit. General:   Alert,  pleasant and cooperative in NAD Head:  Normocephalic  and atraumatic. Neck:  Supple; no masses or thyromegaly. Lungs:  Clear throughout to auscultation, normal respiratory effort.    Heart:  +S1, +S2, Regular rate and rhythm, No edema. Abdomen:  Soft, nontender and nondistended. Normal bowel sounds, without guarding, and without rebound.   Neurologic:  Alert and  oriented x4;  grossly normal neurologically.  Impression/Plan: Rodney [name] is here for an colonoscopy to be performed for rectal bleeding  Risks, benefits, limitations, and alternatives regarding  colonoscopy have been reviewed with the patient.  Questions have been answered.  All parties agreeable.   [name], MD  02/28/2024, 8:47 AM

## 2024-02-28 NOTE — Anesthesia Preprocedure Evaluation (Signed)
 Anesthesia Evaluation  Patient identified by MRN, date of birth, ID band Patient awake    Reviewed: Allergy & Precautions, H&P , NPO status , Patient's Chart, lab work & pertinent test results, reviewed documented beta blocker date and time   History of Anesthesia Complications Negative for: history of anesthetic complications  Airway Mallampati: III  TM Distance: >3 FB Neck ROM: full    Dental  (+) Dental Advidsory Given, Teeth Intact   Pulmonary neg shortness of breath, asthma , neg sleep apnea, COPD, neg recent URI, Current Smoker   Pulmonary exam normal breath sounds clear to auscultation       Cardiovascular Exercise Tolerance: Good negative cardio ROS Normal cardiovascular exam Rhythm:regular Rate:Normal     Neuro/Psych  PSYCHIATRIC DISORDERS Anxiety  Bipolar Disorder   negative neurological ROS     GI/Hepatic negative GI ROS, Neg liver ROS,,,  Endo/Other  negative endocrine ROS    Renal/GU negative Renal ROS  negative genitourinary   Musculoskeletal   Abdominal   Peds  Hematology negative hematology ROS (+)   Anesthesia Other Findings Past Medical History: No date: Anxiety No date: Asthma No date: Bipolar disorder (HCC) 02/08/2011: [employer]     Comment:  Qualifier: Diagnosis of  By: Laural Benes MD, [place]   No date: Seizures (HCC)   Reproductive/Obstetrics negative OB ROS                             Anesthesia Physical Anesthesia Plan  ASA: 2  Anesthesia Plan: General   Post-op Pain Management:    Induction: Intravenous  PONV Risk Score and Plan: 1 and Propofol infusion, TIVA and Treatment may vary due to age or medical condition  Airway Management Planned: Natural Airway and Nasal Cannula  Additional Equipment:   Intra-op Plan:   Post-operative Plan:   Informed Consent: I have reviewed the patients History and Physical, chart, labs and discussed the  procedure including the risks, benefits and alternatives for the proposed anesthesia with the patient or authorized representative who has indicated his/her understanding and acceptance.     Dental Advisory Given  Plan Discussed with: Anesthesiologist, CRNA and Surgeon  Anesthesia Plan Comments:        Anesthesia Quick Evaluation

## 2024-02-28 NOTE — Op Note (Signed)
 [facility] Gastroenterology Patient Name: Rodney [name] Procedure Date: 02/28/2024 9:03 AM MRN: [id number] Account #: [id number] Date of Birth: 11-Mar-1986 Admit Type: Outpatient Age: 37 Room: [facility] ENDO ROOM 2 Gender: Male Note Status: Finalized Instrument Name: Prentice Docker [id number] Procedure:             Colonoscopy Indications:           Rectal bleeding Providers:             [name] MD, MD Referring MD:          Smitty Cords (Referring MD) Medicines:             Propofol per Anesthesia, Monitored Anesthesia Care Complications:         No immediate complications. Procedure:             Pre-Anesthesia Assessment:                        - Prior to the procedure, a History and Physical was                         performed, and patient medications, allergies and                         sensitivities were reviewed. The patient's tolerance                         of previous anesthesia was reviewed.                        - The risks and benefits of the procedure and the                         sedation options and risks were discussed with the                         patient. All questions were answered and informed                         consent was obtained.                        - ASA Grade Assessment: II - A patient with mild                         systemic disease.                        After obtaining informed consent, the colonoscope was                         passed under direct vision. Throughout the procedure,                         the patient's blood pressure, pulse, and oxygen                         saturations were monitored continuously. The                         Colonoscope was introduced  through the anus and                         advanced to the the cecum, identified by the                         appendiceal orifice. The colonoscopy was performed                         with ease. The patient tolerated the procedure well.                          The quality of the bowel preparation was good. The                         ileocecal valve, appendiceal orifice, and rectum were                         photographed. Findings:      The perianal and digital rectal examinations were normal.      Two sessile polyps were found in the transverse colon and ascending       colon. The polyps were 5 to 7 mm in size. These polyps were removed with       a cold snare. Resection and retrieval were complete.      Two sessile polyps were found in the descending colon. The polyps were 6       to 8 mm in size. These polyps were removed with a cold snare. Resection       and retrieval were complete.      Two semi-pedunculated polyps were found in the descending colon. The       polyps were 10 to 13 mm in size. These polyps were removed with a hot       snare. Resection and retrieval were complete.      Non-bleeding internal hemorrhoids were found during retroflexion. The       hemorrhoids were medium-sized and Grade I (internal hemorrhoids that do       not prolapse).      The exam was otherwise without abnormality. Impression:            - Two 5 to 7 mm polyps in the transverse colon and in                         the ascending colon, removed with a cold snare.                         Resected and retrieved.                        - Two 6 to 8 mm polyps in the descending colon,                         removed with a cold snare. Resected and retrieved.                        - Two 10 to 13 mm polyps in the descending colon,  removed with a hot snare. Resected and retrieved.                        - Non-bleeding internal hemorrhoids.                        - The examination was otherwise normal. Recommendation:        - Discharge patient to home (with escort).                        - Resume previous diet.                        - Continue present medications.                        - Await pathology results.                         - Repeat colonoscopy for surveillance based on                         pathology results. Procedure Code(s):     --- Professional ---                        [phone], Colonoscopy, flexible; with removal of                         tumor(s), polyp(s), or other lesion(s) by snare                         technique Diagnosis Code(s):     --- Professional ---                        D12.3, Benign neoplasm of transverse colon (hepatic                         flexure or splenic flexure)                        D12.2, Benign neoplasm of ascending colon                        D12.4, Benign neoplasm of descending colon                        K64.0, First degree hemorrhoids                        K62.5, Hemorrhage of anus and rectum CPT copyright 2022 American Medical Association. All rights reserved. The codes documented in this report are preliminary and upon coder review may  be revised to meet current compliance requirements. [name], MD [name] MD, MD 02/28/2024 9:33:51 AM This report has been signed electronically. Number of Addenda: 0 Note Initiated On: 02/28/2024 9:03 AM Scope Withdrawal Time: 0 hours 17 minutes 8 seconds  Total Procedure Duration: 0 hours 21 minutes 31 seconds  Estimated Blood Loss:  Estimated blood loss: none.      [facility]

## 2024-02-28 NOTE — Transfer of Care (Signed)
 Immediate Anesthesia Transfer of Care Note  Patient: Rodney [name]  Procedure(s) Performed: COLONOSCOPY WITH PROPOFOL  Patient Location: PACU  Anesthesia Type:General  Level of Consciousness: awake, alert , and oriented  Airway & Oxygen Therapy: Patient Spontanous Breathing  Post-op Assessment: Report given to RN and Post -op Vital signs reviewed and stable  Post vital signs: Reviewed and stable  Last Vitals:  Vitals Value Taken Time  BP    Temp    Pulse    Resp    SpO2      Last Pain:  Vitals:   02/28/24 0850  TempSrc: Temporal  PainSc: 0-No pain         Complications: No notable events documented.

## 2024-02-29 ENCOUNTER — Encounter: Payer: Self-pay | Admitting: Gastroenterology

## 2024-02-29 LAB — SURGICAL PATHOLOGY

## 2024-03-10 ENCOUNTER — Encounter: Payer: Self-pay | Admitting: Gastroenterology

## 2024-03-13 ENCOUNTER — Ambulatory Visit: Payer: Medicare Other | Admitting: Professional Counselor

## 2024-03-13 DIAGNOSIS — F411 Generalized anxiety disorder: Secondary | ICD-10-CM | POA: Diagnosis not present

## 2024-03-13 NOTE — Progress Notes (Addendum)
 THERAPIST PROGRESS NOTE  Virtual Visit via Video Note  I connected with Rodney [name] on 03/13/24 at  4:00 PM EDT by a video enabled telemedicine application and verified that I am speaking with the correct person using two identifiers.  Location: Patient: Home Provider: Office   I discussed the limitations of evaluation and management by telemedicine and the availability of in person appointments. The patient expressed understanding and agreed to proceed.  I discussed the assessment and treatment plan with the patient. The patient was provided an opportunity to ask questions and all were answered. The patient agreed with the plan and demonstrated an understanding of the instructions.   The patient was advised to call back or seek an in-person evaluation if the symptoms worsen or if the condition fails to improve as anticipated.  I provided 33 minutes of non-face-to-face time during this encounter. Edmonia Lynch, [facility]  Session Time: 4:01 PM - 4:34 PM  Participation Level: Active  Behavioral Response: Casual, Alert, Anxious  Type of Therapy: Individual Therapy  Treatment Goals addressed: Active Anxiety  LTG: "I guess just to be financially sound, be healthy."     Start:  03/13/24    Expected End:  03/12/25     STG: "I get a bit anxious at times, certain situations." To reduce symptoms of anxiety AEB reduction in GAD7 scores by utilizing coping mechanisms and restructuring maladaptive patterns of thinking over the next 12 weeks.   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 Drinking and smoking is going well, gambling is not going well." To reduce addictive behaviors AEB identifying and reducing triggers and implementing positive replacement behaviors over the next 12 weeks.    STG: "I guess thought blocking or brain fog." To improve mindfulness AEB decrease in brain fog by utilizing mindfulness skills 3 out of 7 days a week for the next 12 weeks.    ProgressTowards  Goals: Initial  Interventions: CBT, Motivational Interviewing, and Supportive  Summary: Rodney [name] is a 38 y.o. male who presents with a history of anxiety and schizophrenia. He appeared alert and oriented x5. He stated things have been fine. He's been busy with work and doing some home improvements/spring cleaning. Rodney [name] engaged in developing his treatment plan to include reducing anxiety and addictive behaviors, and increasing mindfulness. He provided verbal permission to sign treatment plan consent form. He actively listened to coping skills and engaged in 5-4-3-2-1 grounding skill. Rodney [name] asked clarifying questions about urge surfing/replacement activities. He noted a few replacement activities he can do, such as exercising, cooking, and cleaning. He will try to practice these skills between now and next session.  Therapist Response: Conducted session with Rodney [name]. Began session with check-in/update since previous session. Utilized empathetic and reflective listening. Developed treatment plan with input from Rodney [name] on current strengths, needs, and progress towards goals. Obtained verbal permission to sign treatment plan consent form. Provided psychoeducation on coping skills (breathing exercises, TIP, urge surfing) and engaged Rodney [place] in 5-4-3-2-1 grounding mechanisms. Explored replacement activities for unwanted behaviors. Rodney [name] copies of handouts for Rodney [name] to Rodney [name]. Scheduled additional appointment and concluded session.   Suicidal/Homicidal: No  Plan: Return again in 2 weeks.  Diagnosis: GAD (generalized anxiety disorder)  Collaboration of Care: Medication Management AEB chart review  Patient/Guardian was advised Release of Information must be obtained prior to any record release in order to collaborate their care with an outside provider. Patient/Guardian was advised if they have not already done so to contact the registration  department to sign all necessary forms in order for [place] to release  information regarding their care.   Consent: Patient/Guardian gives verbal consent for treatment and assignment of benefits for services provided during this visit. Patient/Guardian expressed understanding and agreed to proceed.   Edmonia Lynch, [facility] 03/13/2024

## 2024-03-19 ENCOUNTER — Other Ambulatory Visit: Payer: Self-pay | Admitting: Psychiatry

## 2024-03-19 DIAGNOSIS — F200 Paranoid schizophrenia: Secondary | ICD-10-CM

## 2024-03-26 ENCOUNTER — Encounter (INDEPENDENT_AMBULATORY_CARE_PROVIDER_SITE_OTHER): Admitting: Urology

## 2024-03-26 NOTE — Progress Notes (Signed)
 Patient left without being seen.  He was contacted by the office staff and declined to be rescheduled

## 2024-03-28 ENCOUNTER — Ambulatory Visit: Admitting: Professional Counselor

## 2024-03-28 DIAGNOSIS — F411 Generalized anxiety disorder: Secondary | ICD-10-CM | POA: Diagnosis not present

## 2024-03-28 IMAGING — CR DG CHEST 2V
3 series · 3 of 3 positions shown · non-contrast
Comparison: 10/12/2020

CLINICAL DATA: Chest pain and short of breath

EXAM:
CHEST - 2 VIEW

[chest pa]
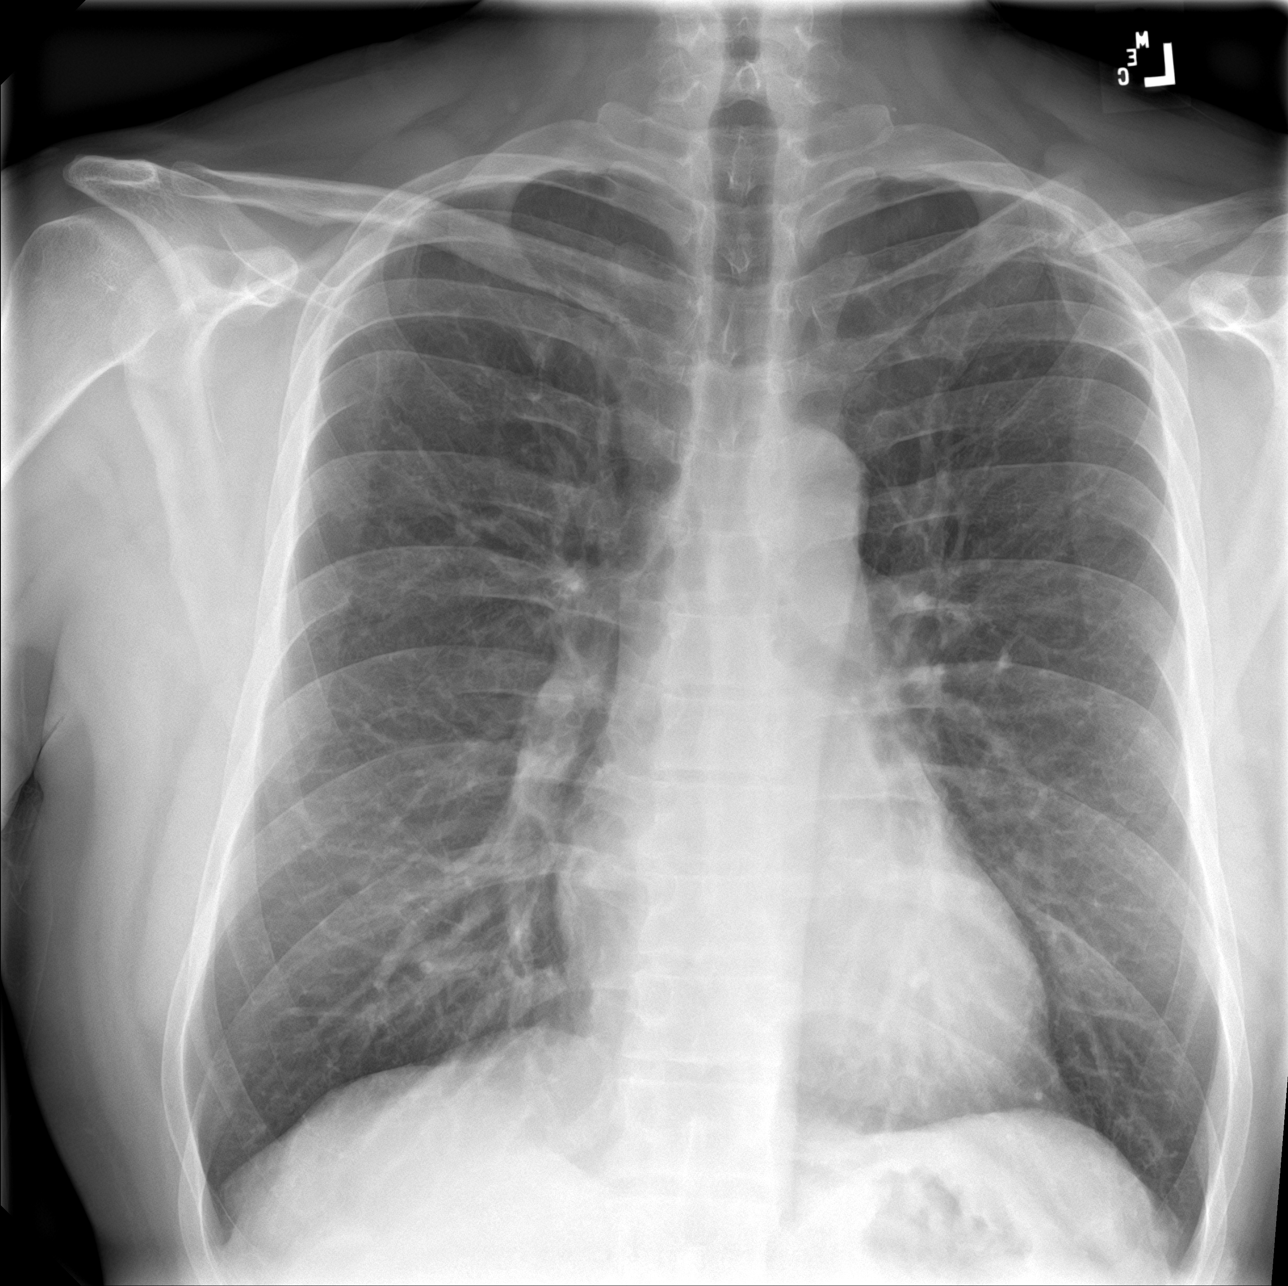

[chest lat (1 of 2)]
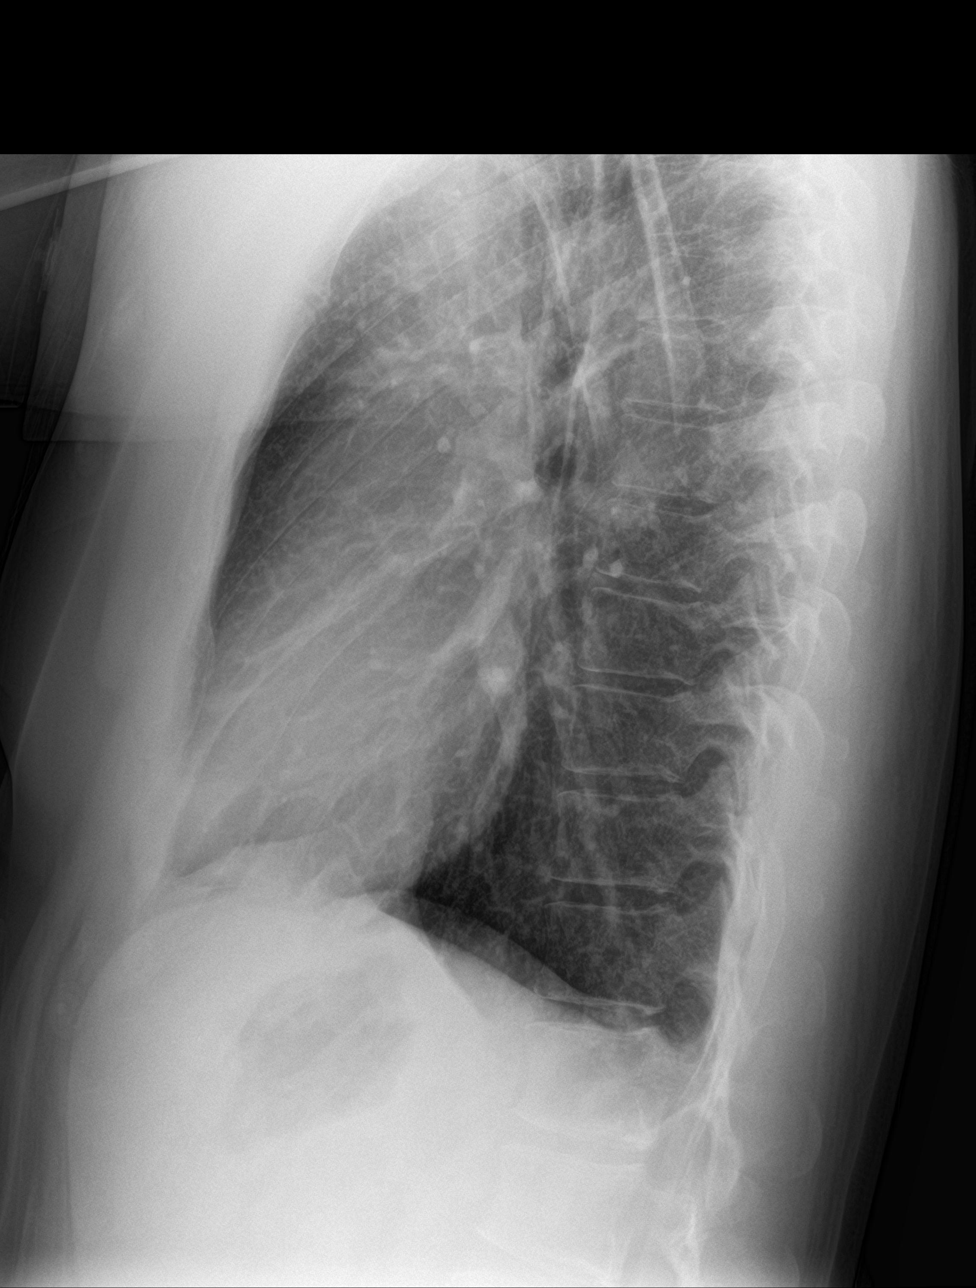

[chest lat (2 of 2)]
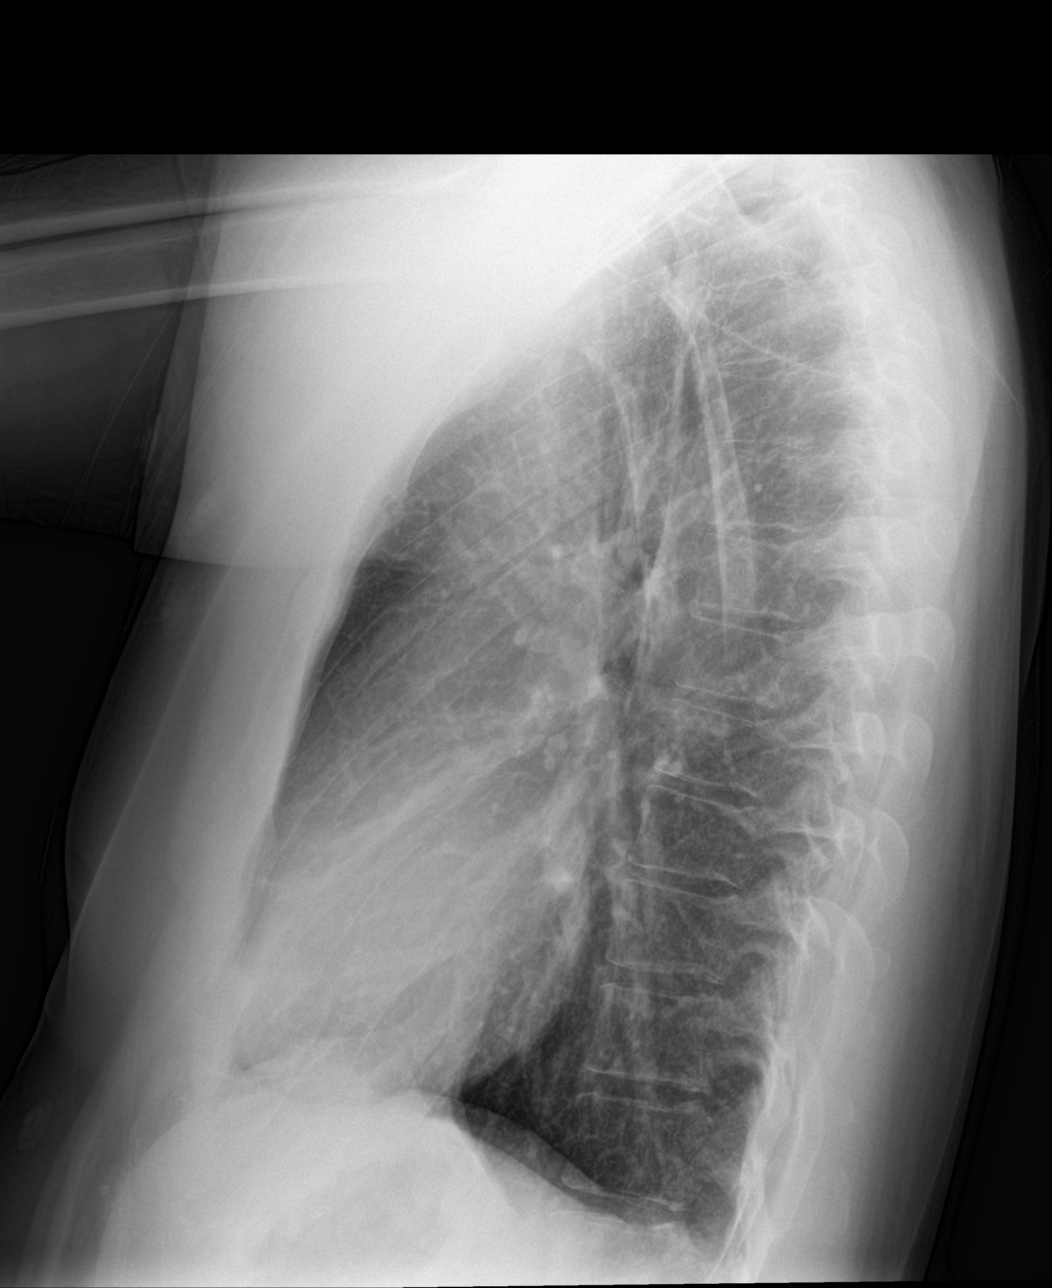

[3 of 3 positions shown; findings below may reference images not displayed]

FINDINGS: The heart size and mediastinal contours are within normal limits.
Both lungs are clear. The visualized skeletal structures are
unremarkable.
IMPRESSION: No active cardiopulmonary disease.

## 2024-03-28 NOTE — Progress Notes (Signed)
 THERAPIST PROGRESS NOTE  Virtual Visit via Video Note  I connected with Rodney [name] on 03/28/24 at 11:00 AM EDT by a video enabled telemedicine application and verified that I am speaking with the correct person using two identifiers.  Location: Patient: Home Provider: Home Office    I discussed the limitations of evaluation and management by telemedicine and the availability of in person appointments. The patient expressed understanding and agreed to proceed.   I discussed the assessment and treatment plan with the patient. The patient was provided an opportunity to ask questions and all were answered. The patient agreed with the plan and demonstrated an understanding of the instructions.   The patient was advised to call back or seek an in-person evaluation if the symptoms worsen or if the condition fails to improve as anticipated.  I provided 48 minutes of non-face-to-face time during this encounter. Rodney [name], [facility]  Session Time: 11:05 AM - 11:53 AM  Participation Level: Active  Behavioral Response: Casual, Alert, Anxious and Dysphoric  Type of Therapy: Individual Therapy  Treatment Goals addressed: Active Anxiety  LTG: "I guess just to be financially sound, be healthy."                 Start:  03/13/24    Expected End:  03/12/25      STG: "I get a bit anxious at times, certain situations." To reduce symptoms of anxiety AEB reduction in GAD7 scores by utilizing coping mechanisms and restructuring maladaptive patterns of thinking over the next 12 weeks.    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 Drinking and smoking is going well, gambling is not going well." To reduce addictive behaviors AEB identifying and reducing triggers and implementing positive replacement behaviors over the next 12 weeks.     STG: "I guess thought blocking or brain fog." To improve mindfulness AEB decrease in brain fog by utilizing mindfulness skills 3 out of 7 days a week for the  next 12 weeks.    ProgressTowards Goals: Progressing  Interventions: CBT, Motivational Interviewing, and Supportive  Summary: Rodney [name] is a 37 y.o. male who presents with  a history of anxiety and schizophrenia. He appeared alert and oriented x5. He stated he is doing okay. He reported his job terminated him because they found out he was planning to leave. He is still unsure about his start date with Rodney [name] but states he has finances to cover him for now. He reported he has not bee engaging in addictive behavior and has been using his free time to "work on myself, doing things I haven't had time to do." Rodney [name] reported a loss with his childhood friend's father passing. He asked his friend to come help demo his deck. Rodney [name] discussed spending more time with his daughter. He was tearful discussing the other children. He was receptive to asking for more time with all of them. Rodney [name] inquired about asking Rodney [name] for a specific start date as well. He was receptive to [employer] and will try to practice it with these situations.  Therapist Response: Conducted session with [employer]. Began session with check-in/update since previous session. Utilized empathetic and reflective listening. Inquired about urge surfing and how Rodney [name] has been managing the extra free time. Summarized Rodney [name]'s thoughts/feelings around his daughter. Explained DEAR MAN skill to assist with objective effectiveness. Emailed copy of skill so Web designer. Confirmed next appointment and concluded session.   Suicidal/Homicidal: No  Plan: Return again in 5  weeks.  Diagnosis: GAD (generalized anxiety disorder)  Collaboration of Care: Medication Management AEB chart review   Patient/Guardian was advised Release of Information must be obtained prior to any record release in order to collaborate their care with an outside provider. Patient/Guardian was advised if they have not already done so to contact the registration department to sign all  necessary forms in order for [place] to release information regarding their care.   Consent: Patient/Guardian gives verbal consent for treatment and assignment of benefits for services provided during this visit. Patient/Guardian expressed understanding and agreed to proceed.   Rodney [name], [facility] 03/28/2024

## 2024-04-02 ENCOUNTER — Other Ambulatory Visit: Payer: Self-pay | Admitting: Pulmonary Disease

## 2024-04-02 DIAGNOSIS — J4489 Other specified chronic obstructive pulmonary disease: Secondary | ICD-10-CM

## 2024-04-28 ENCOUNTER — Other Ambulatory Visit: Payer: Self-pay | Admitting: Psychiatry

## 2024-04-28 ENCOUNTER — Other Ambulatory Visit: Payer: Self-pay | Admitting: Family Medicine

## 2024-04-28 DIAGNOSIS — F411 Generalized anxiety disorder: Secondary | ICD-10-CM

## 2024-04-28 DIAGNOSIS — F1020 Alcohol dependence, uncomplicated: Secondary | ICD-10-CM

## 2024-04-29 ENCOUNTER — Other Ambulatory Visit: Payer: Self-pay | Admitting: Family Medicine

## 2024-04-29 DIAGNOSIS — F1020 Alcohol dependence, uncomplicated: Secondary | ICD-10-CM

## 2024-04-29 DIAGNOSIS — F411 Generalized anxiety disorder: Secondary | ICD-10-CM

## 2024-04-29 NOTE — Telephone Encounter (Signed)
 Copied from CRM [id number]. Topic: Clinical - Medication Refill >> Apr 29, 2024 11:24 AM Lizabeth Riggs wrote: Most Recent Primary Care Visit:  Provider: [facility]  Department: [facility]  Visit Type: LAB  Date: 02/01/2024  Medication: gabapentin  (NEURONTIN ) 600 MG tablet  Has the patient contacted their pharmacy? Yes (Agent: If no, request that the patient contact the pharmacy for the refill. If patient does not wish to contact the pharmacy document the reason why and proceed with request.) (Agent: If yes, when and what did the pharmacy advise?) Pharmacy needs order to refill  Is this the correct pharmacy for this prescription? Yes If no, delete pharmacy and type the correct one.  This is the patient's preferred pharmacy:  [facility] [phone] [facility], [place] - [address] [place] [address] Phone: [phone] Fax: [phone]   Has the prescription been filled recently? No  Is the patient out of the medication? Yes  Has the patient been seen for an appointment in the last year OR does the patient have an upcoming appointment? Yes  Can we respond through MyChart? Yes  Agent: Please be advised that Rx refills may take up to 3 business days. We ask that you follow-up with your pharmacy.

## 2024-05-01 ENCOUNTER — Ambulatory Visit (INDEPENDENT_AMBULATORY_CARE_PROVIDER_SITE_OTHER): Admitting: Professional Counselor

## 2024-05-01 DIAGNOSIS — F411 Generalized anxiety disorder: Secondary | ICD-10-CM | POA: Diagnosis not present

## 2024-05-01 MED ORDER — GABAPENTIN 600 MG PO TABS: 600.0000 mg | ORAL_TABLET | Freq: Three times a day (TID) | ORAL | 0 refills | Status: DC

## 2024-05-01 NOTE — Telephone Encounter (Signed)
 Requested medication (s) are due for refill today: na   Requested medication (s) are on the active medication list: yes   Last refill:  01/24/24 #270 0 refills   Future visit scheduled: no   Notes to clinic:  last OV 10/12/23 last labs 09/04/23. No refills remain. Do you want to refill Rx?     Requested Prescriptions  Pending Prescriptions Disp Refills   gabapentin  (NEURONTIN ) 600 MG tablet 270 tablet 0    Sig: Take 1 tablet (600 mg total) by mouth 3 (three) times daily.     Neurology: Anticonvulsants - gabapentin  Failed - 05/01/2024 10:58 AM      Failed - Cr in normal range and within 360 days    Creat  Date Value Ref Range Status  07/21/2020 0.81 0.60 - 1.35 mg/dL Final   Creatinine, Ser  Date Value Ref Range Status  09/04/2023 0.73 (L) 0.76 - 1.27 mg/dL Final         Failed - Valid encounter within last 12 months    Recent Outpatient Visits   None            Passed - Completed PHQ-2 or PHQ-9 in the last 360 days

## 2024-05-01 NOTE — Progress Notes (Signed)
 THERAPIST PROGRESS NOTE  Virtual Visit via Video Note  I connected with Rodney [name] on 05/01/24 at  4:00 PM EDT by a video enabled telemedicine application and verified that I am speaking with the correct person using two identifiers.  Location: Patient: [employer]) Provider: Office   I discussed the limitations of evaluation and management by telemedicine and the availability of in person appointments. The patient expressed understanding and agreed to proceed.  I discussed the assessment and treatment plan with the patient. The patient was provided an opportunity to ask questions and all were answered. The patient agreed with the plan and demonstrated an understanding of the instructions.   The patient was advised to call back or seek an in-person evaluation if the symptoms worsen or if the condition fails to improve as anticipated.  I provided 45 minutes of non-face-to-face time during this encounter. Len Quale, [facility]  Session Time: 4:04 PM - 4:49 PM   Participation Level: Active  Behavioral Response: Casual, Alert, Euthymic  Type of Therapy: Individual Therapy  Treatment Goals addressed: Active Anxiety  LTG: "I guess just to be financially sound, be healthy."                 Start:  03/13/24    Expected End:  03/12/25      STG: "I get a bit anxious at times, certain situations." To reduce symptoms of anxiety AEB reduction in GAD7 scores by utilizing coping mechanisms and restructuring maladaptive patterns of thinking over the next 12 weeks.    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 Drinking and smoking is going well, gambling is not going well." To reduce addictive behaviors AEB identifying and reducing triggers and implementing positive replacement behaviors over the next 12 weeks.     STG: "I guess thought blocking or brain fog." To improve mindfulness AEB decrease in brain fog by utilizing mindfulness skills 3 out of 7 days a week for the next 12 weeks.     ProgressTowards Goals: Progressing  Interventions: Solution Focused and Supportive  Summary: Rodney [name] is a 38 y.o. male who presents with a history of anxiety and schizophrenia. He appeared alert and oriented x5. 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 He reported he was also able to talk to his stepdaughters and spend some time with them when he was picking up his daughter. Rodney [name] reported his daughter was diagnosed with level II autism. He expressed his concerns about the diagnosis but was receptive to feedback from this Clinical research associate. He took notes of resources and plans to follow-up with getting more information to be a supportive parent. Rodney [name] would like to gain custody of his daughter and has been communicating with her mother about this and also with his mother, who may move in with him to provide additional support to raise his daughter. He additionally noted he has been sober for 5 days. Overall, Rodney [name] stated things are going well.   Therapist Response: Conducted session with Rodney [name]. Began session with check-in/update since previous session. Utilized empathetic and reflective listening. Discussed criteria for autism and provided resources for support. Praised Rodney [name] for being assertive about the job at [employer] and with his stepdaughters. Explored options for gaining custody and needs for having his daughter full-time. Scheduled additional appointment and concluded session.   Suicidal/Homicidal: No  Plan: Return again in 7 weeks.  Diagnosis: GAD (generalized anxiety disorder)  Collaboration  of Care: Medication Management AEB chart review  Patient/Guardian was advised Release of Information must be obtained prior to any record release in order to collaborate their care with an outside provider. Patient/Guardian was advised if they have not already done so to contact the registration department to sign all necessary forms in order for us  to  release information regarding their care.   Consent: Patient/Guardian gives verbal consent for treatment and assignment of benefits for services provided during this visit. Patient/Guardian expressed understanding and agreed to proceed.   Len Quale, [facility] 05/01/2024

## 2024-05-08 ENCOUNTER — Telehealth: Payer: Self-pay | Admitting: Family Medicine

## 2024-05-08 NOTE — Telephone Encounter (Signed)
 LVM 05/08/2024 to schedule AWV. Please schedule Virtual or Telehealth visits ONLY.   Rodney [name]; Care Guide Ambulatory Clinical Support [facility] Direct Dial: [phone]

## 2024-05-22 ENCOUNTER — Telehealth: Payer: Self-pay

## 2024-05-22 NOTE — Telephone Encounter (Signed)
 Called patient back since he had left a voicemail to know what his colonoscopy results were. I let him know that if he had further questions, to please call us  back. I will also mail him the letter that Dr. Antony Baumgartner wrote to him.

## 2024-05-26 ENCOUNTER — Telehealth: Payer: Self-pay | Admitting: *Deleted

## 2024-05-26 NOTE — Telephone Encounter (Signed)
 Copied from CRM [id number]. Topic: Appointments - Transfer of Care >> May 26, 2024  9:52 AM Tiffany S wrote: Pt is requesting to transfer FROM: Dr Romeo Co  Pt is requesting to transfer TO: Dr Arabella Beach  Reason for requested transfer: Closer to home  It is the responsibility of the team the patient would like to transfer to (Dr. Arabella Beach ) to reach out to the patient if for any reason this transfer is not acceptable.

## 2024-05-26 NOTE — Telephone Encounter (Signed)
 Message sent to [id number] to reach out to this patient to schedule.

## 2024-05-28 ENCOUNTER — Telehealth: Payer: Self-pay

## 2024-05-28 DIAGNOSIS — F411 Generalized anxiety disorder: Secondary | ICD-10-CM

## 2024-05-28 DIAGNOSIS — F200 Paranoid schizophrenia: Secondary | ICD-10-CM

## 2024-05-28 NOTE — Telephone Encounter (Signed)
 Please verify pharmacy with patient since he has been using local pharmacy.

## 2024-05-28 NOTE — Telephone Encounter (Signed)
 received fax requesting a refill on the divalproes 250mg  and the 500mg  pt was last seen on 3-5 next appt 6-12

## 2024-05-29 ENCOUNTER — Telehealth: Payer: Self-pay

## 2024-05-29 NOTE — Telephone Encounter (Signed)
 Copied from CRM [id number]. Topic: Appointments - Transfer of Care >> May 29, 2024  9:44 AM Sophia H wrote: Pt is requesting to transfer FROM: Dr. Romeo Co Pt is requesting to transfer TO: [place] Meryl Acosta Reason for requested transfer: Too far of a drive for the patient  It is the responsibility of the team the patient would like to transfer to [facility] Meryl Acosta) to reach out to the patient if for any reason this transfer is not acceptable.

## 2024-05-29 NOTE — Telephone Encounter (Signed)
 left message to call our office to confirm pharmacy

## 2024-06-03 MED ORDER — DIVALPROEX SODIUM ER 250 MG PO TB24: 250.0000 mg | ORAL_TABLET | Freq: Every day | ORAL | 0 refills | Status: DC

## 2024-06-03 MED ORDER — DIVALPROEX SODIUM ER 500 MG PO TB24: 500.0000 mg | ORAL_TABLET | Freq: Every day | ORAL | 0 refills | Status: DC

## 2024-06-03 NOTE — Telephone Encounter (Signed)
 Pt.notified

## 2024-06-03 NOTE — Telephone Encounter (Signed)
 pt left [facility]

## 2024-06-03 NOTE — Telephone Encounter (Signed)
 I have [facility]

## 2024-06-05 ENCOUNTER — Telehealth: Admitting: Psychiatry

## 2024-06-05 ENCOUNTER — Ambulatory Visit: Payer: Medicare Other | Admitting: Pulmonary Disease

## 2024-06-05 ENCOUNTER — Encounter: Payer: Self-pay | Admitting: Psychiatry

## 2024-06-05 DIAGNOSIS — F17200 Nicotine dependence, unspecified, uncomplicated: Secondary | ICD-10-CM

## 2024-06-05 DIAGNOSIS — Z5181 Encounter for therapeutic drug level monitoring: Secondary | ICD-10-CM

## 2024-06-05 DIAGNOSIS — F200 Paranoid schizophrenia: Secondary | ICD-10-CM | POA: Diagnosis not present

## 2024-06-05 DIAGNOSIS — F411 Generalized anxiety disorder: Secondary | ICD-10-CM | POA: Diagnosis not present

## 2024-06-05 DIAGNOSIS — F1011 Alcohol abuse, in remission: Secondary | ICD-10-CM

## 2024-06-05 DIAGNOSIS — F17211 Nicotine dependence, cigarettes, in remission: Secondary | ICD-10-CM | POA: Diagnosis not present

## 2024-06-05 DIAGNOSIS — Z79899 Other long term (current) drug therapy: Secondary | ICD-10-CM

## 2024-06-05 MED ORDER — RISPERIDONE 0.25 MG PO TABS: 0.2500 mg | ORAL_TABLET | Freq: Every day | ORAL | 0 refills | Status: DC

## 2024-06-05 MED ORDER — VENLAFAXINE HCL 25 MG PO TABS: 25.0000 mg | ORAL_TABLET | Freq: Every day | ORAL | 1 refills | Status: DC

## 2024-06-05 NOTE — Progress Notes (Signed)
 Virtual Visit via Video Note  I connected with Rodney [name] on 06/05/24 at  4:20 PM EDT by a video enabled telemedicine application and verified that I am speaking with the correct person using two identifiers.  Location Provider Location : [facility] Patient Location : Home  Participants: Patient , Provider   I discussed the limitations of evaluation and management by telemedicine and the availability of in person appointments. The patient expressed understanding and agreed to proceed.   I discussed the assessment and treatment plan with the patient. The patient was provided an opportunity to ask questions and all were answered. The patient agreed with the plan and demonstrated an understanding of the instructions.   The patient was advised to call back or seek an in-person evaluation if the symptoms worsen or if the condition fails to improve as anticipated.  BH MD OP Progress Note  06/06/2024 12:16 PM Rodney [name]  MRN:  [id number]  Chief Complaint:  Chief Complaint  Patient presents with   Follow-up   Anxiety   Schizophrenia   Medication Refill   HPI: Rodney [name] is a 38 year old Caucasian male, employed, separated from wife, lives in [place], has a history of schizophrenia, GAD, alcohol use disorder, cannabis abuse in remission, tobacco use disorder was evaluated by telemedicine today.  Currently reports overall mood symptoms are stable on the current medication regimen.  Has not had any significant anxiety or mood swings.  Denies any paranoia or psychosis.  Currently compliant on medications as prescribed although may have been taking risperidone  at a higher dosage accidentally.  However is interested in reducing it back to 0.25 mg.  Interested in being tapered off of venlafaxine  if possible since he feels his anxiety symptoms are currently stable and he wants to reduce the amount of medications that he takes on a daily basis.  Reports sleep is overall good.  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  Reports that is going well.  He is currently coparenting with his ex-girlfriend, they were together for 10 years, never officially married.  They have a daughter together and he gets his daughter every weekend.  Patient currently denies any suicidality, homicidality or perceptual disturbances.  Currently sober from alcohol since the past 40 days.  He also quit smoking.  Would like to continue to stay away.  Denies any other concerns today.  Visit Diagnosis:    ICD-10-CM   1. Paranoid schizophrenia (HCC)  F20.0 risperiDONE  (RISPERDAL ) 0.25 MG tablet    2. GAD (generalized anxiety disorder)  F41.1 venlafaxine  (EFFEXOR ) 25 MG tablet    3. Alcohol use disorder, mild, in early remission  F10.11     4. Tobacco use disorder  F17.200     5. High risk medication use  Z79.899 Valproic acid  level    Platelet count    Hepatic function panel    Sodium      Past Psychiatric History: I have reviewed past psychiatric history from progress note on 09/23/2018.  Past Medical History:  Past Medical History:  Diagnosis Date   Anxiety    Asthma    Bipolar disorder (HCC)    CIGARETTE SMOKER 02/08/2011   Qualifier: Diagnosis of  By: Lincoln Renshaw MD, [place]     Seizures [facility])     Past Surgical History:  Procedure Laterality Date   COLONOSCOPY WITH PROPOFOL  N/A 02/28/2024   Procedure: COLONOSCOPY WITH PROPOFOL ;  Surgeon: Luke Salaam, MD;  Location[facility] END[facility]COPY;  Service: Gastroenterology;  Laterality:  N/A;   WISDOM TOOTH EXTRACTION      Family Psychiatric History: I have reviewed family psychiatric history from progress note on 09/23/2018.  Family History:  Family History  Problem Relation Age of Onset   Anxiety disorder Mother    OCD Brother    Anxiety disorder Maternal Aunt    Anxiety disorder Maternal Grandmother    Anxiety disorder Paternal Grandfather     Social History: I have reviewed social history from progress note on 09/23/2018. 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    Occupation: Shipping and receiving  Tobacco Use   Smoking status: Every Day    Current packs/day: 1.50    Average packs/day: 1.5 packs/day for 20.0 years (30.0 ttl pk-yrs)    Types: Cigarettes   Smokeless tobacco: Never   Tobacco comments:    1.5 PPD khj 12/06/2023  Vaping Use   Vaping status: Former  Substance and Sexual Activity   Alcohol use: Yes    Alcohol/week: 2.0 standard drinks of alcohol    Types: 2 Cans of beer per week    Comment: weekly.none last 24hrs   Drug use: Not Currently    Types: Marijuana, Other-see comments, LSD    Comment: in past   Sexual activity: Yes    Birth control/protection: None  Other Topics Concern   Not on file  Social History Narrative   Not on file   Social Drivers of Health   Financial Resource Strain: Low Risk  (02/01/2024)   Overall Financial Resource Strain (CARDIA)    Difficulty of Paying Living Expenses: Not very hard  Food Insecurity: No Food Insecurity (02/01/2024)   Hunger Vital Sign    Worried About Running Out of Food in the Last Year: Never true    Ran Out of Food in the Last Year: Never true  Transportation Needs: No Transportation Needs (02/01/2024)   PRAPARE - Administrator, Civil Service (Medical): No    Lack of Transportation (Non-Medical): No  Physical Activity: Sufficiently Active (02/01/2024)   Exercise Vital Sign    Days of Exercise per Week: 5 days    Minutes of Exercise per Session: 120 min  Stress: Stress Concern Present (02/01/2024)   [employer] of Occupational Health - Occupational Stress Questionnaire    Feeling of Stress : To some extent  Social Connections: Moderately  Isolated (02/01/2024)   Social Connection and Isolation Panel    Frequency of Communication with Friends and Family: More than three times a week    Frequency of Social Gatherings with Friends and Family: Three times a week    Attends Religious Services: More than 4 times per year    Active Member of Clubs or Organizations: No    Attends Banker Meetings: Never    Marital Status: Never married    Allergies:  Allergies  Allergen Reactions   Hydroxyzine Hcl Rash    Metabolic Disorder Labs: Lab Results  Component Value Date   HGBA1C 5.3 06/05/2023   MPG 105.41 06/05/2023   MPG 105 03/04/2020   Lab Results  Component Value Date   PROLACTIN 11.4 06/05/2023   Lab Results  Component Value Date  CHOL 187 06/05/2023   TRIG 101 06/05/2023   HDL 65 06/05/2023   CHOLHDL 2.9 06/05/2023   VLDL 20 06/05/2023   LDLCALC 102 (H) 06/05/2023   LDLCALC 84 03/04/2020   Lab Results  Component Value Date   TSH 0.850 06/05/2023   TSH 3.543 05/24/2022    Therapeutic Level Labs: No results found for: LITHIUM Lab Results  Component Value Date   VALPROATE 64 06/05/2023   VALPROATE 61 05/24/2022   No results found for: CBMZ  Current Medications: Current Outpatient Medications  Medication Sig Dispense Refill   venlafaxine  (EFFEXOR ) 25 MG tablet Take 1 tablet (25 mg total) by mouth daily with breakfast. Stop venlafaxine  xr 37.5 mg daily 30 tablet 1   albuterol  (VENTOLIN  HFA) 108 (90 Base) MCG/ACT inhaler TAKE 2 PUFFS BY MOUTH EVERY 6 HOURS AS NEEDED FOR WHEEZE OR SHORTNESS OF BREATH 8.5 each 11   benztropine  (COGENTIN ) 1 MG tablet TAKE 1 TABLET (1 MG TOTAL) BY MOUTH 2 (TWO) TIMES DAILY. FOR SIDE EFFECTS OF RISPERIDONE  180 tablet 0   cyclobenzaprine  (FLEXERIL ) 10 MG tablet Take 1 tablet (10 mg total) by mouth 2 (two) times daily as needed for muscle spasms. 20 tablet 0   dicyclomine  (BENTYL ) 10 MG capsule Take 1 capsule (10 mg total) by mouth 4 (four) times daily -  before  meals and at bedtime. 30 capsule 5   divalproex  (DEPAKOTE  ER) 250 MG 24 hr tablet Take 1 tablet (250 mg total) by mouth daily. Take along with 500 mg , total dose of 750 mg daily 90 tablet 0   divalproex  (DEPAKOTE  ER) 500 MG 24 hr tablet Take 1 tablet (500 mg total) by mouth daily. Take along with 250 mg daily 90 tablet 0   Fluticasone -Umeclidin-Vilant (TRELEGY ELLIPTA ) 200-62.5-25 MCG/ACT AEPB Inhale 1 puff into the lungs daily. 60 each 11   gabapentin  (NEURONTIN ) 600 MG tablet Take 1 tablet (600 mg total) by mouth 3 (three) times daily. 270 tablet 0   Na Sulfate-K Sulfate-Mg Sulfate concentrate (SUPREP) 17.5-3.13-1.6 GM/177ML SOLN See admin instructions.     predniSONE  (DELTASONE ) 20 MG tablet Take 2 tablets (40 mg total) by mouth daily. 10 tablet 0   risperiDONE  (RISPERDAL ) 0.25 MG tablet Take 1 tablet (0.25 mg total) by mouth at bedtime. Dose change 90 tablet 0   triamcinolone  (NASACORT ) 55 MCG/ACT AERO nasal inhaler PLACE 1 SPRAY INTO THE NOSE 2 (TWO) TIMES DAILY. 16.9 each 12   No current facility-administered medications for this visit.     Musculoskeletal: Strength & Muscle Tone: UTA Gait & Station: Seated Patient leans: N/A  Psychiatric Specialty Exam: Review of Systems  Psychiatric/Behavioral:  The patient is nervous/anxious.     There were no vitals taken for this visit.There is no height or weight on file to calculate BMI.  General Appearance: Casual  Eye Contact:  Fair  Speech:  Clear and Coherent  Volume:  Normal  Mood:  Anxious coping well  Affect:  Congruent  Thought Process:  Goal Directed and Descriptions of Associations: Intact  Orientation:  Full (Time, Place, and Person)  Thought Content: Logical   Suicidal Thoughts:  No  Homicidal Thoughts:  No  Memory:  Immediate;   Fair Recent;   Fair Remote;   Fair  Judgement:  Fair  Insight:  Fair  Psychomotor Activity:  Normal  Concentration:  Concentration: Fair and Attention Span: Fair  Recall:  [employer] of  Knowledge: Fair  Language: Fair  Akathisia:  No  Handed:  Right  AIMS (if indicated): not done  Assets:  Communication Skills Desire for Improvement Housing Social Support Transportation  ADL's:  Intact  Cognition: WNL  Sleep:  Fair   Screenings: Geneticist, molecular Office Visit from 12/27/2023 in [facility] Office Visit from 10/10/2023 in [facility] Office Visit from 08/07/2023 in [facility] Office Visit from 02/07/2023 in [facility] Video Visit from 10/05/2022 in [facility]  AIMS Total Score 0 0 0 0 0   AUDIT    Flowsheet Row Video Visit from 08/01/2022 in [facility]  Alcohol Use Disorder Identification Test Final Score (AUDIT) 15   GAD-7    Flowsheet Row Counselor from 02/01/2024 in [facility] Office Visit from 12/27/2023 in [facility] Office Visit from 10/10/2023 in [facility] Office Visit from 10/02/2023 in [facility] Office Visit from 09/04/2023 in [facility]  Total GAD-7 Score 2 8 9 10 11    PHQ2-9    Flowsheet Row Counselor from 02/01/2024 in [facility] Office Visit from 12/27/2023 in [facility] Office Visit from 10/10/2023 in [facility] Office Visit from 10/02/2023 in [facility] Office Visit from 09/04/2023 in [facility]  PHQ-2 Total Score 0 0 0 1 0  PHQ-9 Total Score 2 -- -- 1 1   Flowsheet Row Video Visit from 06/05/2024 in [facility] Admission  (Discharged) from 02/28/2024 in [facility] Video Visit from 02/27/2024 in [facility]  C-SSRS RISK CATEGORY No Risk No Risk No Risk     Assessment and Plan: Rodney [name] is a 38 year old Caucasian male, [place] in [place], has a history of schizophrenia, alcohol use disorder, cannabis abuse in remission was evaluated by telemedicine today.  Discussed assessment and plan as noted below.  Schizophrenia in remission Currently denies any significant symptoms, denies any psychosis.  Compliant on medications as prescribed. Continue Risperidone  0.25 mg at bedtime Continue Benztropine  1 mg twice a day as needed for side effects of Risperidone . Continue Depakote  750 mg daily (Depakote  level-06/05/2023-64-therapeutic)  Denies anxiety disorder-stable Currently anxiety symptoms are manageable and is interested in coming off of the venlafaxine . Reduce Venlafaxine  to 25 mg daily. Continue psychotherapy sessions with Ms. Deetta Farrow as needed  Alcohol use disorder in early remission Has been sober since the past 40 days. Encouraged to stay sober.  Tobacco use disorder-in early remission Quit smoking 40 days ago. Will reevaluate in future sessions  High risk medication use-will order Depakote  level, platelet count, sodium, LFT.  [facility] to [facility] to [facility]  Follow-up Follow-up in clinic in 4 weeks or sooner if needed.    Consent: Patient/Guardian gives verbal consent for treatment and assignment of benefits for services provided during this visit. Patient/Guardian expressed understanding and agreed to proceed.   This note was generated in part or whole with voice recognition software. Voice recognition is usually quite accurate but there are transcription errors that can and very often do occur. I apologize for any typographical errors that were not detected and corrected.    Mirko Tailor, MD 06/06/2024, 12:16 PM

## 2024-06-17 ENCOUNTER — Ambulatory Visit (INDEPENDENT_AMBULATORY_CARE_PROVIDER_SITE_OTHER): Admitting: Professional Counselor

## 2024-06-17 DIAGNOSIS — F200 Paranoid schizophrenia: Secondary | ICD-10-CM

## 2024-06-17 DIAGNOSIS — F411 Generalized anxiety disorder: Secondary | ICD-10-CM

## 2024-06-17 DIAGNOSIS — F1011 Alcohol abuse, in remission: Secondary | ICD-10-CM | POA: Diagnosis not present

## 2024-06-17 NOTE — Progress Notes (Unsigned)
  THERAPIST PROGRESS NOTE  Virtual Visit via Video Note  I connected with Rodney [name] on 06/17/24 at  4:00 PM EDT by a video enabled telemedicine application and verified that I am speaking with the correct person using two identifiers.  Location: Patient: Home Provider: Remote office   I discussed the limitations of evaluation and management by telemedicine and the availability of in person appointments. The patient expressed understanding and agreed to proceed.   I discussed the assessment and treatment plan with the patient. The patient was provided an opportunity to ask questions and all were answered. The patient agreed with the plan and demonstrated an understanding of the instructions.   The patient was advised to call back or seek an in-person evaluation if the symptoms worsen or if the condition fails to improve as anticipated.  I provided 42 minutes of non-face-to-face time during this encounter. Rodney [name], [facility]  Session Time: 4:02 PM - 4:44 PM   Participation Level: Active  Behavioral Response: CasualAlertEuthymic  Type of Therapy: Individual Therapy  Treatment Goals addressed: ***  ProgressTowards Goals: Progressing  Interventions: {CHL AMB BH Type of Intervention:[id number]}  Summary: Rodney [name] is a 38 y.o. male who presents with ***. Work still good, daughter good, still taking on weekends, plans to talk to lawyer first about custody, started smoking again on Sunday, also had a beer, political climate, unsure about sobriety on the fence  Suicidal/Homicidal: No  Therapist Response: Conducted session with . Began session with check-in/update since previous session. Utilized empathetic and reflective listening. Scheduled additional appointment and concluded session.   Plan: Return again in *** weeks.  Diagnosis: Paranoid schizophrenia (HCC)  GAD (generalized anxiety disorder)  Alcohol use disorder, mild, in early remission  Collaboration of Care:  Medication Management AEB chart review  Patient/Guardian was advised Release of Information must be obtained prior to any record release in order to collaborate their care with an outside provider. Patient/Guardian was advised if they have not already done so to contact the registration department to sign all necessary forms in order for us  to release information regarding their care.   Consent: Patient/Guardian gives verbal consent for treatment and assignment of benefits for services provided during this visit. Patient/Guardian expressed understanding and agreed to proceed.   Rodney [name], [facility] 06/17/2024

## 2024-06-23 ENCOUNTER — Other Ambulatory Visit: Payer: Self-pay | Admitting: Psychiatry

## 2024-06-23 DIAGNOSIS — F200 Paranoid schizophrenia: Secondary | ICD-10-CM

## 2024-06-26 ENCOUNTER — Telehealth: Admitting: Psychiatry

## 2024-06-26 ENCOUNTER — Encounter: Payer: Self-pay | Admitting: Psychiatry

## 2024-06-26 DIAGNOSIS — F17200 Nicotine dependence, unspecified, uncomplicated: Secondary | ICD-10-CM

## 2024-06-26 DIAGNOSIS — F17210 Nicotine dependence, cigarettes, uncomplicated: Secondary | ICD-10-CM

## 2024-06-26 DIAGNOSIS — F200 Paranoid schizophrenia: Secondary | ICD-10-CM | POA: Diagnosis not present

## 2024-06-26 DIAGNOSIS — F1011 Alcohol abuse, in remission: Secondary | ICD-10-CM | POA: Diagnosis not present

## 2024-06-26 DIAGNOSIS — F411 Generalized anxiety disorder: Secondary | ICD-10-CM

## 2024-06-26 MED ORDER — VENLAFAXINE HCL 25 MG PO TABS: 12.5000 mg | ORAL_TABLET | ORAL | Status: DC

## 2024-06-26 NOTE — Progress Notes (Signed)
 Virtual Visit via Video Note  I connected with Rodney [name] on 06/26/24 at  4:30 PM EDT by a video enabled telemedicine application and verified that I am speaking with the correct person using two identifiers.  Location Provider Location : [facility] Patient Location : Car  Participants: Patient , Provider    I discussed the limitations of evaluation and management by telemedicine and the availability of in person appointments. The patient expressed understanding and agreed to proceed.    I discussed the assessment and treatment plan with the patient. The patient was provided an opportunity to ask questions and all were answered. The patient agreed with the plan and demonstrated an understanding of the instructions.   The patient was advised to call back or seek an in-person evaluation if the symptoms worsen or if the condition fails to improve as anticipated.   BH MD OP Progress Note  06/26/2024 5:00 PM Rodney [name]  MRN:  [id number]  Chief Complaint:  Chief Complaint  Patient presents with   Follow-up   Anxiety   Schizophrenia   Medication Refill   Discussed the use of AI scribe software for clinical note transcription with the patient, who gave verbal consent to proceed.  History of Present Illness Rodney [name] is a 38 year old Caucasian male, employed, separated from wife, lives in [place], has a history of schizophrenia, GAD, alcohol use disorder, cannabis abuse in remission, tobacco use disorder was evaluated by telemedicine today.  He is following up after a medication adjustment made three weeks ago, where his venlafaxine  dosage was reduced. He has not noticed any significant changes in his mood or anxiety levels since the adjustment. No withdrawal symptoms such as excessive sweating, tremors, or 'brain zaps'.  Denies suicidal thoughts or thoughts of harming others.   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  He looks forward to spending the weekend with his daughter.  Socially, he has resumed smoking cigarettes after a period of cessation. He wants to quit again in the future and has previously used nicotine patches with some success. He is not currently using any alcohol.  Denies any other concerns today..   Visit Diagnosis:    ICD-10-CM   1. Paranoid schizophrenia (HCC)  F20.0     2. GAD (generalized anxiety disorder)  F41.1 venlafaxine  (EFFEXOR ) 25 MG tablet    3. Alcohol use disorder, mild, in early remission  F10.11     4. Tobacco use disorder  F17.200       Past Psychiatric History: I have reviewed past psychiatric history from progress note on 09/23/2018.  Past Medical History:  Past Medical History:  Diagnosis Date   Anxiety    Asthma    Bipolar disorder (HCC)    CIGARETTE SMOKER 02/08/2011   Qualifier: Diagnosis of  By: Vicci MD, [place]     Seiz[facility])     Past Surgical History:  Procedure Laterality Date   COLONOSCOPY WITH PROPOFOL  N/A 02/28/2024   Procedure: COLONOSCOPY WITH PROPOFOL ;  Surgeon: Therisa Bi, MD;  Location: [facility] ENDOSCOPY;  Service: Gastroenterology;  Laterality: N/A;   WISDOM TOOTH EXTRACTION      Family Psychiatric History: I have reviewed family psychiatric history from progress note on 09/23/2018.  Family History:  Family History  Problem Relation Age of Onset   Anxiety disorder Mother    OCD Brother    Anxiety  disorder Maternal Aunt    Anxiety disorder Maternal Grandmother    Anxiety disorder Paternal Grandfather     Social History: I have reviewed social history from progress note on 09/23/2018. Social History   Socioeconomic History   Marital status: Significant Other    Spouse name: Not on file   Number of children: 1   Years of education: Not on file   Highest education level: Associate degree: occupational, Scientist, product/process development, or vocational program  Occupational History   Occupation:  Location manager     Comment: Armed forces operational officer    Occupation: Shipping and receiving  Tobacco Use   Smoking status: Every Day    Current packs/day: 1.50    Average packs/day: 1.5 packs/day for 20.0 years (30.0 ttl pk-yrs)    Types: Cigarettes   Smokeless tobacco: Never   Tobacco comments:    1.5 PPD khj 12/06/2023  Vaping Use   Vaping status: Former  Substance and Sexual Activity   Alcohol use: Yes    Alcohol/week: 2.0 standard drinks of alcohol    Types: 2 Cans of beer per week    Comment: weekly.none last 24hrs   Drug use: Not Currently    Types: Marijuana, Other-see comments, LSD    Comment: in past   Sexual activity: Yes    Birth control/protection: None  Other Topics Concern   Not on file  Social History Narrative   Not on file   Social Drivers of Health   Financial Resource Strain: Low Risk  (02/01/2024)   Overall Financial Resource Strain (CARDIA)    Difficulty of Paying Living Expenses: Not very hard  Food Insecurity: No Food Insecurity (02/01/2024)   Hunger Vital Sign    Worried About Running Out of Food in the Last Year: Never true    Ran Out of Food in the Last Year: Never true  Transportation Needs: No Transportation Needs (02/01/2024)   PRAPARE - Administrator, Civil Service (Medical): No    Lack of Transportation (Non-Medical): No  Physical Activity: Sufficiently Active (02/01/2024)   Exercise Vital Sign    Days of Exercise per Week: 5 days    Minutes of Exercise per Session: 120 min  Stress: Stress Concern Present (02/01/2024)   [employer] of Occupational Health - Occupational Stress Questionnaire    Feeling of Stress : To some extent  Social Connections: Moderately Isolated (02/01/2024)   Social Connection and Isolation Panel    Frequency of Communication with Friends and Family: More than three times a week    Frequency of Social Gatherings with Friends and Family: Three times a week    Attends Religious Services: More than 4 times per year     Active Member of Clubs or Organizations: No    Attends Banker Meetings: Never    Marital Status: Never married    Allergies:  Allergies  Allergen Reactions   Hydroxyzine Hcl Rash    Metabolic Disorder Labs: Lab Results  Component Value Date   HGBA1C 5.3 06/05/2023   MPG 105.41 06/05/2023   MPG 105 03/04/2020   Lab Results  Component Value Date   PROLACTIN 11.4 06/05/2023   Lab Results  Component Value Date   CHOL 187 06/05/2023   TRIG 101 06/05/2023   HDL 65 06/05/2023   CHOLHDL 2.9 06/05/2023   VLDL 20 06/05/2023   LDLCALC 102 (H) 06/05/2023   LDLCALC 84 03/04/2020   Lab Results  Component Value Date   TSH 0.850 06/05/2023   TSH 3.543  05/24/2022    Therapeutic Level Labs: No results found for: LITHIUM Lab Results  Component Value Date   VALPROATE 64 06/05/2023   VALPROATE 61 05/24/2022   No results found for: CBMZ  Current Medications: Current Outpatient Medications  Medication Sig Dispense Refill   albuterol  (VENTOLIN  HFA) 108 (90 Base) MCG/ACT inhaler TAKE 2 PUFFS BY MOUTH EVERY 6 HOURS AS NEEDED FOR WHEEZE OR SHORTNESS OF BREATH 8.5 each 11   benztropine  (COGENTIN ) 1 MG tablet TAKE 1 TABLET (1 MG TOTAL) BY MOUTH 2 (TWO) TIMES DAILY. FOR SIDE EFFECTS OF RISPERIDONE  180 tablet 0   cyclobenzaprine  (FLEXERIL ) 10 MG tablet Take 1 tablet (10 mg total) by mouth 2 (two) times daily as needed for muscle spasms. 20 tablet 0   dicyclomine  (BENTYL ) 10 MG capsule Take 1 capsule (10 mg total) by mouth 4 (four) times daily -  before meals and at bedtime. 30 capsule 5   divalproex  (DEPAKOTE  ER) 250 MG 24 hr tablet Take 1 tablet (250 mg total) by mouth daily. Take along with 500 mg , total dose of 750 mg daily 90 tablet 0   divalproex  (DEPAKOTE  ER) 500 MG 24 hr tablet Take 1 tablet (500 mg total) by mouth daily. Take along with 250 mg daily 90 tablet 0   Fluticasone -Umeclidin-Vilant (TRELEGY ELLIPTA ) 200-62.5-25 MCG/ACT AEPB Inhale 1 puff into the lungs  daily. 60 each 11   gabapentin  (NEURONTIN ) 600 MG tablet Take 1 tablet (600 mg total) by mouth 3 (three) times daily. 270 tablet 0   Na Sulfate-K Sulfate-Mg Sulfate concentrate (SUPREP) 17.5-3.13-1.6 GM/177ML SOLN See admin instructions.     predniSONE  (DELTASONE ) 20 MG tablet Take 2 tablets (40 mg total) by mouth daily. 10 tablet 0   risperiDONE  (RISPERDAL ) 0.25 MG tablet Take 1 tablet (0.25 mg total) by mouth at bedtime. Dose change 90 tablet 0   triamcinolone  (NASACORT ) 55 MCG/ACT AERO nasal inhaler PLACE 1 SPRAY INTO THE NOSE 2 (TWO) TIMES DAILY. 16.9 each 12   venlafaxine  (EFFEXOR ) 25 MG tablet Take 0.5-1 tablets (12.5-25 mg total) by mouth as directed. Take 12.5 mg daily and 25 mg alternate days for 2 weeks and then 12.5 mg daily for two weeks and stop     No current facility-administered medications for this visit.     Musculoskeletal: Strength & Muscle Tone: UTA Gait & Station: Seated Patient leans: N/A  Psychiatric Specialty Exam: Review of Systems  Psychiatric/Behavioral: Negative.      There were no vitals taken for this visit.There is no height or weight on file to calculate BMI.  General Appearance: Casual  Eye Contact:  Fair  Speech:  Clear and Coherent  Volume:  Normal  Mood:  Euthymic  Affect:  Congruent  Thought Process:  Goal Directed and Descriptions of Associations: Intact  Orientation:  Full (Time, Place, and Person)  Thought Content: Logical   Suicidal Thoughts:  No  Homicidal Thoughts:  No  Memory:  Immediate;   Fair Recent;   Fair Remote;   Fair  Judgement:  Fair  Insight:  Fair  Psychomotor Activity:  Normal  Concentration:  Concentration: Fair and Attention Span: Fair  Recall:  [employer] of Knowledge: Fair  Language: Fair  Akathisia:  No  Handed:  Right  AIMS (if indicated): not done  Assets:  Communication Skills Desire for Improvement Housing Social Support Transportation  ADL's:  Intact  Cognition: WNL  Sleep:  Fair    Screenings: Midwife Visit from  12/27/2023 in [facility] Office Visit from 10/10/2023 in [facility] Office Visit from 08/07/2023 in [facility] Office Visit from 02/07/2023 in [facility] Video Visit from 10/05/2022 in [facility]  AIMS Total Score 0 0 0 0 0   AUDIT    Flowsheet Row Video Visit from 08/01/2022 in [facility]  Alcohol Use Disorder Identification Test Final Score (AUDIT) 15   GAD-7    Flowsheet Row Counselor from 02/01/2024 in [facility] Office Visit from 12/27/2023 in [facility] Office Visit from 10/10/2023 in [facility] Office Visit from 10/02/2023 in [facility] Office Visit from 09/04/2023 in [facility]  Total GAD-7 Score 2 8 9 10 11    PHQ2-9    Flowsheet Row Counselor from 02/01/2024 in [facility] Office Visit from 12/27/2023 in [facility] Office Visit from 10/10/2023 in [facility] Office Visit from 10/02/2023 in [facility] Office Visit from 09/04/2023 in [facility]  PHQ-2 Total Score 0 0 0 1 0  PHQ-9 Total Score 2 -- -- 1 1   Flowsheet Row Video Visit from 06/26/2024 in [facility] Video Visit from 06/05/2024 in [facility] Admission (Discharged) from 02/28/2024 in [facility]  C-SSRS RISK CATEGORY No Risk No Risk No Risk     Assessment and Plan: Rodney [name]  is a 38 year old Caucasian male, has a history of schizophrenia, alcohol use disorder, cannabis abuse in remission was evaluated by telemedicine today.  Discussed assessment and plan as noted below.  Schizophrenia in remission Currently denies any significant symptoms.  Compliant on the lower dosage of risperidone  and doing well. Continue Risperidone  0.25 mg at bedtime. Continue Benztropine  1 mg twice a day as needed for side effects. Continue Depakote  750 mg daily (Depakote  level-06/05/2023-64-therapeutic) Pending labs, Depakote  level, platelet count, sodium, LFT-has been noncompliant.  Agrees to get it completed.  Generalized anxiety disorder-stable Currently denies any significant anxiety symptoms.  Agreeable to further tapering off of venlafaxine  since he is tolerating the tapering process well. Reduce Venlafaxine  to 25 mg and 12.5 mg on alternate days for 2 weeks, then reduced to venlafaxine  12.5 mg daily for 2 weeks and stop taking. Continue psychotherapy sessions with Ms. Veva as needed.  Alcohol use disorder in early remission Currently denies any problems with alcohol. Will reevaluate in future session.  Tobacco use disorder-unstable Restarted smoking again. Provided counseling for 1 minute.  Follow-up Follow-up in clinic in 1 month or sooner.  Patient agrees to contact the clinic on Monday to schedule a follow-up appointment.     Consent: Patient/Guardian gives verbal consent for treatment and assignment of benefits for services provided during this visit. Patient/Guardian expressed understanding and agreed to proceed.   This note was generated in part or whole with voice recognition software. Voice recognition is usually quite accurate but there are transcription errors that can and very often do occur. I apologize for any typographical errors that were not detected and corrected.    Jaiyden Laur, MD 06/26/2024, 5:00 PM

## 2024-07-08 ENCOUNTER — Ambulatory Visit: Admitting: Pulmonary Disease

## 2024-07-08 ENCOUNTER — Encounter: Payer: Self-pay | Admitting: Pulmonary Disease

## 2024-07-08 VITALS — BP 130/90 | HR 70 | Temp 98.2°F | Ht 77.0 in | Wt 200.0 lb

## 2024-07-08 DIAGNOSIS — F17210 Nicotine dependence, cigarettes, uncomplicated: Secondary | ICD-10-CM | POA: Diagnosis not present

## 2024-07-08 DIAGNOSIS — J4489 Other specified chronic obstructive pulmonary disease: Secondary | ICD-10-CM | POA: Diagnosis not present

## 2024-07-08 DIAGNOSIS — J302 Other seasonal allergic rhinitis: Secondary | ICD-10-CM

## 2024-07-08 MED ORDER — ALBUTEROL SULFATE HFA 108 (90 BASE) MCG/ACT IN AERS: 2.0000 | INHALATION_SPRAY | Freq: Four times a day (QID) | RESPIRATORY_TRACT | 3 refills | Status: DC | PRN

## 2024-07-08 MED ORDER — ALBUTEROL SULFATE HFA 108 (90 BASE) MCG/ACT IN AERS: 2.0000 | INHALATION_SPRAY | Freq: Four times a day (QID) | RESPIRATORY_TRACT | 3 refills | Status: AC | PRN

## 2024-07-08 NOTE — Patient Instructions (Signed)
 VISIT SUMMARY:  You came in for a follow-up visit to discuss your COPD and asthma overlap. You mentioned that you are using your rescue inhaler, albuterol , infrequently and that you are not currently on Trelegy. Your last pulmonary function test showed a combination of COPD/asthma.  However, you have resumed smoking, which has affected your breathing. You also shared that you engage in regular physical activity without any breathing issues when you were not smoking.  YOUR PLAN:  -COPD WITH ASTHMA OVERLAP: COPD with asthma overlap means you have chronic obstructive pulmonary disease along with asthma, which causes reversible airway obstruction. Your recent spirometry test showed improvement in lung function after using a bronchodilator. You are currently smoking half a pack of cigarettes per day, which worsens your COPD. You are advised to quit smoking as it significantly improves your symptoms. You are currently on a rescue inhaler (albuterol ) which you use rarely.  You discontinue Trelegy on your own.  You do not wish to restart Trelegy.  We will ensure you have refills for your albuterol  inhaler.  We recommended a follow-up pulmonary function test but you declined.  Follow-up appointments will be scheduled as needed, especially if your symptoms worsen, this is your choice.  INSTRUCTIONS:  Please ensure you have refills for your albuterol  inhaler. Schedule a follow-up appointment if your symptoms worsen. Quitting smoking is highly recommended to manage your COPD effectively.

## 2024-07-08 NOTE — Progress Notes (Unsigned)
 Subjective:    Patient ID: Rodney [name], male    DOB: 1986-05-10, 38 y.o.   MRN: [id number]  Patient Care Team: Rodney Marsa PARAS, DO as PCP - General (Family Medicine) Rodney Dedra CROME, MD as Consulting Physician (Pulmonary Disease)  Chief Complaint  Patient presents with   Follow-up    BACKGROUND/INTERVAL:Rodney [name] is a 38 year old current smoker (1.5 PPD) who presents for follow-up on the issue of asthma/COPD overlap. Patient continues to be compliant with Trelegy Ellipta  200.  Last visit here was 18 April 2023 with Rodney Rouleau, NP.  At that time he was stable and instructed to continue Trelegy, as needed albuterol  and nasal hygiene for his chronic/perennial rhinitis.  He has not had any exacerbations nor hospitalizations since his last visit.   HPI    Review of Systems A 10 point review of systems was performed and it is as noted above otherwise negative.   Patient Active Problem List   Diagnosis Date Noted   Rectal bleeding 02/28/2024   Adenomatous polyp of colon 02/28/2024   Memory loss 05/23/2022   High risk medication use 05/23/2022   Bladder wall thickening 08/06/2020   GAD (generalized anxiety disorder) 09/02/2019   Cannabis use disorder, mild, in sustained remission 09/02/2019   Alcohol use disorder, moderate, dependence (HCC) 02/18/2019   Asthma with COPD (HCC) 09/26/2018   Paranoid schizophrenia (HCC) 02/05/2018   Vitamin D deficiency 08/09/2015   Reactive airway disease with status asthmaticus 08/09/2015   Cigarette nicotine dependence with nicotine-induced disorder 09/14/2014   Alcohol use disorder, mild, abuse 02/08/2011   Tobacco use disorder 02/08/2011   Allergic rhinitis 02/08/2011   SCHIZOPHRENIA, HX OF 02/08/2011    Social History   Tobacco Use   Smoking status: Every Day    Current packs/day: 0.50    Average packs/day: 2.0 packs/day for 21.5 years (43.0 ttl pk-yrs)    Types: Cigarettes    Start date: 2004   Smokeless tobacco: Never   Substance Use Topics   Alcohol use: Yes    Alcohol/week: 2.0 standard drinks of alcohol    Types: 2 Cans of beer per week    Comment: weekly.none last 24hrs    Allergies  Allergen Reactions   Hydroxyzine Hcl Rash    Current Meds  Medication Sig   albuterol  (VENTOLIN  HFA) 108 (90 Base) MCG/ACT inhaler TAKE 2 PUFFS BY MOUTH EVERY 6 HOURS AS NEEDED FOR WHEEZE OR SHORTNESS OF BREATH   benztropine  (COGENTIN ) 1 MG tablet TAKE 1 TABLET (1 MG TOTAL) BY MOUTH 2 (TWO) TIMES DAILY. FOR SIDE EFFECTS OF RISPERIDONE    cyclobenzaprine  (FLEXERIL ) 10 MG tablet Take 1 tablet (10 mg total) by mouth 2 (two) times daily as needed for muscle spasms.   dicyclomine  (BENTYL ) 10 MG capsule Take 1 capsule (10 mg total) by mouth 4 (four) times daily -  before meals and at bedtime.   divalproex  (DEPAKOTE  ER) 250 MG 24 hr tablet Take 1 tablet (250 mg total) by mouth daily. Take along with 500 mg , total dose of 750 mg daily   divalproex  (DEPAKOTE  ER) 500 MG 24 hr tablet Take 1 tablet (500 mg total) by mouth daily. Take along with 250 mg daily   gabapentin  (NEURONTIN ) 600 MG tablet Take 1 tablet (600 mg total) by mouth 3 (three) times daily.   Na Sulfate-K Sulfate-Mg Sulfate concentrate (SUPREP) 17.5-3.13-1.6 GM/177ML SOLN See admin instructions.   predniSONE  (DELTASONE ) 20 MG tablet Take 2 tablets (40 mg total) by mouth daily.   risperiDONE  (  RISPERDAL ) 0.25 MG tablet Take 1 tablet (0.25 mg total) by mouth at bedtime. Dose change   triamcinolone  (NASACORT ) 55 MCG/ACT AERO nasal inhaler PLACE 1 SPRAY INTO THE NOSE 2 (TWO) TIMES DAILY.   venlafaxine  (EFFEXOR ) 25 MG tablet Take 0.5-1 tablets (12.5-25 mg total) by mouth as directed. Take 12.5 mg daily and 25 mg alternate days for 2 weeks and then 12.5 mg daily for two weeks and stop    Immunization History  Administered Date(s) Administered   Influenza, Seasonal, Injecte, Preservative Fre 10/02/2023   Pneumococcal Polysaccharide-23 08/09/2015   Tdap 07/19/2020         Objective:     BP (!) 130/90 (BP Location: Left Arm, Patient Position: Sitting, Cuff Size: Normal)   Pulse 70   Temp 98.2 F (36.8 C) (Oral)   Ht 6' 5 (1.956 m)   Wt 200 lb (90.7 kg)   SpO2 98%   BMI 23.72 kg/m   SpO2: 98 %  GENERAL: HEAD: Normocephalic, atraumatic.  EYES: Pupils equal, round, reactive to light.  No scleral icterus.  MOUTH:  NECK: Supple. No thyromegaly. Trachea midline. No JVD.  No adenopathy. PULMONARY: Good air entry bilaterally.  No adventitious sounds. CARDIOVASCULAR: S1 and S2. Regular rate and rhythm.  ABDOMEN: MUSCULOSKELETAL: No joint deformity, no clubbing, no edema.  NEUROLOGIC:  SKIN: Intact,warm,dry. PSYCH:        Assessment & Plan:   No diagnosis found.  No orders of the defined types were placed in this encounter.   No orders of the defined types were placed in this encounter.      Advised if symptoms do not improve or worsen, to please contact office for sooner follow up or seek emergency care.    I spent xxx minutes of dedicated to the care of this patient on the date of this encounter to include pre-visit review of records, face-to-face time with the patient discussing conditions above, post visit ordering of testing, clinical documentation with the electronic health record, making appropriate referrals as documented, and communicating necessary findings to members of the patients care team.     C. Rodney Sanders, MD Advanced Bronchoscopy [facility]    *This note was generated using voice recognition software/Dragon and/or AI transcription program.  Despite best efforts to proofread, errors can occur which can change the meaning. Any transcriptional errors that result from this process are unintentional and may not be fully corrected at the time of dictation.

## 2024-07-09 ENCOUNTER — Ambulatory Visit (INDEPENDENT_AMBULATORY_CARE_PROVIDER_SITE_OTHER): Admitting: Professional Counselor

## 2024-07-09 ENCOUNTER — Other Ambulatory Visit
Admission: RE | Admit: 2024-07-09 | Discharge: 2024-07-09 | Disposition: A | Discharge status: Home / Self Care | Attending: Psychiatry | Admitting: Psychiatry

## 2024-07-09 ENCOUNTER — Ambulatory Visit: Payer: Self-pay | Admitting: Psychiatry

## 2024-07-09 DIAGNOSIS — F200 Paranoid schizophrenia: Secondary | ICD-10-CM | POA: Diagnosis not present

## 2024-07-09 DIAGNOSIS — F411 Generalized anxiety disorder: Secondary | ICD-10-CM | POA: Diagnosis not present

## 2024-07-09 DIAGNOSIS — Z79899 Other long term (current) drug therapy: Secondary | ICD-10-CM | POA: Insufficient documentation

## 2024-07-09 LAB — HEPATIC FUNCTION PANEL
ALT: 14 U/L (ref 0–44)
AST: 19 U/L (ref 15–41)
Albumin: 4.4 g/dL (ref 3.5–5.0)
Alkaline Phosphatase: 51 U/L (ref 38–126)
Bilirubin, Direct: 0.1 mg/dL (ref 0.0–0.2)
Indirect Bilirubin: 1.1 mg/dL — ABNORMAL HIGH (ref 0.3–0.9)
Total Bilirubin: 1.2 mg/dL (ref 0.0–1.2)
Total Protein: 7.8 g/dL (ref 6.5–8.1)

## 2024-07-09 LAB — SODIUM: Sodium: 135 mmol/L (ref 135–145)

## 2024-07-09 LAB — PLATELET COUNT: Platelets: 339 K/uL (ref 150–400)

## 2024-07-09 LAB — VALPROIC ACID LEVEL: Valproic Acid Lvl: 36 ug/mL — ABNORMAL LOW (ref 50–100)

## 2024-07-09 NOTE — Addendum Note (Signed)
 Addended by: ALEX SLOUGH C on: 07/09/2024 12:08 PM   Modules accepted: Orders

## 2024-07-09 NOTE — Progress Notes (Signed)
 THERAPIST PROGRESS NOTE  Virtual Visit via Video Note  I connected with Rodney [name] on 07/09/24 at  4:00 PM EDT by a video enabled telemedicine application and verified that I am speaking with the correct person using two identifiers.  Location: Patient: Community (parked car) Provider: Remote office   I discussed the limitations of evaluation and management by telemedicine and the availability of in person appointments. The patient expressed understanding and agreed to proceed.   I discussed the assessment and treatment plan with the patient. The patient was provided an opportunity to ask questions and all were answered. The patient agreed with the plan and demonstrated an understanding of the instructions.   The patient was advised to call back or seek an in-person evaluation if the symptoms worsen or if the condition fails to improve as anticipated.  I provided 20 minutes of non-face-to-face time during this encounter. Rodney [name], [facility]  Session Time: 4:02 PM - 4:22 PM   Participation Level: Active  Behavioral Response: Well Groomed, Alert, Euthymic  Type of Therapy: Individual Therapy  Treatment Goals addressed: Active Anxiety  LTG: I guess just to be financially sound, be healthy.  (Progressing)    Start:  03/13/24    Expected End:  03/12/25    Goal Note Right now as far as that goes I'm trying to bring up my credit score and being able to use home equity with my mortgage. I need to consolidate pretty much credit cards and stuff like that.   STG: I get a bit anxious at times, certain situations. To reduce symptoms of anxiety AEB reduction in GAD7 scores by utilizing coping mechanisms and restructuring maladaptive patterns of thinking over the next 12 weeks. (Progressing)  Goal Note I'd say all and all pretty good.  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 Drinking and smoking is going well, gambling is not going well. To reduce addictive behaviors AEB  identifying and reducing triggers and implementing positive replacement behaviors over the next 12 weeks.  (Progressing)  Goal Note I feel like what I did accomplish was really good and it made me feel like I can do those things. Right now, I'm on a bit of a side road but I plan on jumping back in and moderate or eliminate things in the future. I scratch that itch (gambling) occasionally but it's not like it was, an every day thing.  STG: I guess thought blocking or brain fog. To improve mindfulness AEB decrease in brain fog by utilizing mindfulness skills 3 out of 7 days a week for the next 12 weeks.  (Progressing)  Goal Note That's just random like when it will happen but I'm leaning towards it's gotten a little bit better.   ProgressTowards Goals: Progressing  Interventions: Motivational Interviewing and Supportive  Summary: NTHONY [name] is a 38 y.o. male who presents with a history of paranoid schizophrenia and anxiety. He appeared alert and oriented x5. He stated he too his daughter to the beach over the weekend and they had a lot of cun. He reported he is off work the rest of the week as the [employer] is shut down this [place] noted progress on goals and areas for continued improvement. He scored mild on anxiety screening.   Therapist Response: Conducted session with [employer]. Began session with check-in/update since previous session. Utilized empathetic and reflective listening. Used open-ended questions to facilitate discussion and summarized thoughts/feelings. Reviewed treatment plan with input from [place] on current strengths,  needs, and progress towards goals. Confirmed next appointment and concluded session.   Suicidal/Homicidal: No  Plan: Return again in 4 weeks.  Diagnosis: Paranoid schizophrenia (HCC)  GAD (generalized anxiety disorder)  Collaboration of Care: Medication Management AEB chart review  Patient/Guardian was advised Release of Information must be obtained prior to  any record release in order to collaborate their care with an outside provider. Patient/Guardian was advised if they have not already done so to contact the registration department to sign all necessary forms in order for us  to release information regarding their care.   Consent: Patient/Guardian gives verbal consent for treatment and assignment of benefits for services provided during this visit. Patient/Guardian expressed understanding and agreed to proceed.   Rodney [name], [facility] 07/09/2024

## 2024-07-11 ENCOUNTER — Encounter: Admitting: Family Medicine

## 2024-07-11 ENCOUNTER — Encounter: Payer: Self-pay | Admitting: Pulmonary Disease

## 2024-07-17 ENCOUNTER — Ambulatory Visit (INDEPENDENT_AMBULATORY_CARE_PROVIDER_SITE_OTHER)

## 2024-07-17 VITALS — BP 132/79 | HR 63 | Ht 77.0 in | Wt 198.0 lb

## 2024-07-17 DIAGNOSIS — F200 Paranoid schizophrenia: Secondary | ICD-10-CM

## 2024-07-17 DIAGNOSIS — F1020 Alcohol dependence, uncomplicated: Secondary | ICD-10-CM

## 2024-07-17 DIAGNOSIS — F17219 Nicotine dependence, cigarettes, with unspecified nicotine-induced disorders: Secondary | ICD-10-CM | POA: Diagnosis not present

## 2024-07-17 NOTE — Assessment & Plan Note (Signed)
 Patient previously on Trelegy inhaler, however his insurance stopped covering it and reported  the cost was going to be $700 a month.  Patient denies wheezing and/or chest tightness.  Denies chronic cough. Adventitious breath sounds heard on physical exam.  Discussed with patient that we could upgrade his albuterol  inhaler to Airsupra and/for see if his insurance will cover Breztri instead of Trelegy.  Patient was not agreeable to either of these options at this time.  Discussed the importance of smoking cessation.  Discussed treatment options including Chantix  and or nicotine replacement.  Patient is interested in quitting, however would like to do so on his own first.  Patient verbalized that if prescription therapy becomes something that he believes he needs, he will reach out via MyChart for this.  Will revisit discussion at his next visit in 6 months.

## 2024-07-17 NOTE — Assessment & Plan Note (Signed)
 Reporting 3-5 standard drinks daily.  History of prolonged use, at risk of alcohol dependence/overuse. Educated him on the importance of alcohol cessation, reviewed alcohol cessation techniques and recommendations.  Patient verbalized that he knows he needs to quit and is working to cut back the amount of drinks he is having per day.  Advised him to discuss with his psychiatrist for additional resources and medication management.  Patient verbalized understanding and was in agreement with the plan.

## 2024-07-17 NOTE — Patient Instructions (Signed)
 It was nice to see you today!  As we discussed in clinic:  -We will get fasting blood work from you in the coming weeks and I will send you a MyChart message with the results. -Continue all of your current medications as prescribed by psychiatry and keep regular follow up appointments with them  -Please let me know if you would like me to send in Nicotine Patches to help with smoking cessation. It is important that you quit sooner rather than later to avoid further damage to your lungs.  -I will plan to see you back in 6 months for a follow up. It was nice to meet you!  If you have any problems before your next visit feel free to message me via MyChart (minor issues or questions) or call the office, otherwise you may reach out to schedule an office visit.  Thank you! Saddie Sacks, PA-C

## 2024-07-17 NOTE — Assessment & Plan Note (Signed)
 Stable, chronic problem for >11+ years.  Followed by Dr. Eappen who prescribes all of his psych medications including Depakote . Last depakote  level low at 35 (07/12/24).  Advised him to keep close follow-up with psychiatrist and continue all of his current psych medications.  Will continue to monitor.

## 2024-07-17 NOTE — Progress Notes (Signed)
 New Patient Office Visit  Subjective    Patient ID: Rodney [name], male    DOB: September 12, 1986  Age: 38 y.o. MRN: [id number]  CC:  Chief Complaint  Patient presents with   New Patient (Initial Visit)    Establishing Care    HPI Rodney [name] presents to establish care. Reports that he was previously seeing Dr. Edman for primary care but he recently starting working at the [employer] so this office is much closer for him. He is sleeping well. Bowel movements regular but does endorse that they are not always formed.   PMH: Tobacco use disorder, ETOH use disorder, bipolar/schizophrenia followed closely by psychiatry. Denies any other Pmhx.  Tobacco use: Currently smoking 1 ppd. Reports he quit for 2 months but has recently started back. Alcohol use: Has recently reduced his use to 3-5 drinks per day. Drug use: Denies Marital status: Single, has one 65 year old daughter Employment: Full time employee at [employer] Sexual hx: Occasionally sexually active, would like STI testing  Screenings:  Colon Cancer: Patient had a colonoscopy in March 2025 due to some GI issues. Reports polyps were found and they want him coming back every 3 years for follow up. Lung Cancer: N/A Breast Cancer: N/A  Diabetes: Will check A1c with labs HLD: Will check A1c with labs   Outpatient Encounter Medications as of 07/17/2024  Medication Sig   albuterol  (VENTOLIN  HFA) 108 (90 Base) MCG/ACT inhaler Inhale 2 puffs into the lungs every 6 (six) hours as needed for wheezing or shortness of breath.   benztropine  (COGENTIN ) 1 MG tablet TAKE 1 TABLET (1 MG TOTAL) BY MOUTH 2 (TWO) TIMES DAILY. FOR SIDE EFFECTS OF RISPERIDONE    divalproex  (DEPAKOTE  ER) 250 MG 24 hr tablet Take 1 tablet (250 mg total) by mouth daily. Take along with 500 mg , total dose of 750 mg daily   divalproex  (DEPAKOTE  ER) 500 MG 24 hr tablet Take 1 tablet (500 mg total) by mouth daily. Take along with 250 mg daily   gabapentin  (NEURONTIN )  600 MG tablet Take 1 tablet (600 mg total) by mouth 3 (three) times daily.   risperiDONE  (RISPERDAL ) 0.25 MG tablet Take 1 tablet (0.25 mg total) by mouth at bedtime. Dose change   triamcinolone  (NASACORT ) 55 MCG/ACT AERO nasal inhaler PLACE 1 SPRAY INTO THE NOSE 2 (TWO) TIMES DAILY.   venlafaxine  (EFFEXOR ) 25 MG tablet Take 0.5-1 tablets (12.5-25 mg total) by mouth as directed. Take 12.5 mg daily and 25 mg alternate days for 2 weeks and then 12.5 mg daily for two weeks and stop   cyclobenzaprine  (FLEXERIL ) 10 MG tablet Take 1 tablet (10 mg total) by mouth 2 (two) times daily as needed for muscle spasms. (Patient not taking: Reported on 07/17/2024)   dicyclomine  (BENTYL ) 10 MG capsule Take 1 capsule (10 mg total) by mouth 4 (four) times daily -  before meals and at bedtime. (Patient not taking: Reported on 07/17/2024)   Fluticasone -Umeclidin-Vilant (TRELEGY ELLIPTA ) 200-62.5-25 MCG/ACT AEPB Inhale 1 puff into the lungs daily. (Patient not taking: Reported on 07/17/2024)   Na Sulfate-K Sulfate-Mg Sulfate concentrate (SUPREP) 17.5-3.13-1.6 GM/177ML SOLN See admin instructions. (Patient not taking: Reported on 07/17/2024)   predniSONE  (DELTASONE ) 20 MG tablet Take 2 tablets (40 mg total) by mouth daily. (Patient not taking: Reported on 07/17/2024)   No facility-administered encounter medications on file as of 07/17/2024.    Past Medical History:  Diagnosis Date   Anxiety    Asthma  Bipolar disorder (HCC)    CIGARETTE SMOKER 02/08/2011   Qualifier: Diagnosis of  By: Rodney [name], Rodney [name]     Seizures [facility])     Past Surgical History:  Procedure Laterality Date   COLONOSCOPY WITH PROPOFOL  N/A 02/28/2024   Procedure: COLONOSCOPY WITH PROPOFOL ;  Surgeon: Rodney [name], [name];  Location: [facility] ENDOSCOPY;  Service: Gastroenterology;  Laterality: N/A;   WISDOM TOOTH EXTRACTION      Family History  Problem Relation Age of Onset   Anxiety disorder Mother    OCD Brother    Anxiety disorder Maternal Aunt     Anxiety disorder Maternal Grandmother    Anxiety disorder Paternal Grandfather     Social History   Socioeconomic History   Marital status: Significant Other    Spouse name: Not on file   Number of children: 1   Years of education: Not on file   Highest education level: Associate degree: occupational, Scientist, product/process development, or vocational program  Occupational History   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    Occupation: Shipping and receiving  Tobacco Use   Smoking status: Every Day    Current packs/day: 0.50    Average packs/day: 2.0 packs/day for 21.6 years (43.0 ttl pk-yrs)    Types: Cigarettes    Start date: 2004   Smokeless tobacco: Never  Vaping Use   Vaping status: Former  Substance and Sexual Activity   Alcohol use: Yes    Alcohol/week: 2.0 standard drinks of alcohol    Types: 2 Cans of beer per week    Comment: weekly.none last 24hrs   Drug use: Not Currently    Types: Marijuana, Other-see comments, LSD    Comment: in past   Sexual activity: Yes    Birth control/protection: None  Other Topics Concern   Not on file  Social History Narrative   Not on file   Social Drivers of Health   Financial Resource Strain: Low Risk  (02/01/2024)   Overall Financial Resource Strain (CARDIA)    Difficulty of Paying Living Expenses: Not very hard  Food Insecurity: No Food Insecurity (02/01/2024)   Hunger Vital Sign    Worried About Running Out of Food in the Last Year: Never true    Ran Out of Food in the Last Year: Never true  Transportation Needs: No Transportation Needs (02/01/2024)   PRAPARE - Administrator, Civil Service (Medical): No    Lack of Transportation (Non-Medical): No  Physical Activity: Sufficiently Active (02/01/2024)   Exercise Vital Sign    Days of Exercise per Week: 5 days    Minutes of Exercise per Session: 120 min  Stress: Stress Concern Present (02/01/2024)   [employer] of Occupational Health - Occupational Stress Questionnaire     Feeling of Stress : To some extent  Social Connections: Moderately Isolated (02/01/2024)   Social Connection and Isolation Panel    Frequency of Communication with Friends and Family: More than three times a week    Frequency of Social Gatherings with Friends and Family: Three times a week    Attends Religious Services: More than 4 times per year    Active Member of Clubs or Organizations: No    Attends Banker Meetings: Never    Marital Status: Never married  Intimate Partner Violence: Not At Risk (02/01/2024)   Humiliation, Afraid, Rape, and Kick questionnaire    Fear of Current or Ex-Partner: No    Emotionally Abused: No    Physically Abused:  No    Sexually Abused: No    ROS   Per HPI   Objective    BP 132/79   Pulse 63   Ht 6' 5 (1.956 m)   Wt 198 lb 0.6 oz (89.8 kg)   SpO2 96%   BMI 23.48 kg/m   Physical Exam Constitutional:      General: He is not in acute distress.    Appearance: Normal appearance.  HENT:     Right Ear: Tympanic membrane normal.     Left Ear: Tympanic membrane normal.     Mouth/Throat:     Mouth: Mucous membranes are moist.     Pharynx: Oropharynx is clear.  Eyes:     Pupils: Pupils are equal, round, and reactive to light.  Cardiovascular:     Rate and Rhythm: Normal rate and regular rhythm.     Heart sounds: Normal heart sounds. No murmur heard.    No friction rub. No gallop.  Pulmonary:     Effort: Pulmonary effort is normal. No respiratory distress.     Breath sounds: Normal breath sounds.  Abdominal:     General: Abdomen is flat. Bowel sounds are normal.     Palpations: Abdomen is soft.  Musculoskeletal:        General: No swelling.     Cervical back: Neck supple.  Lymphadenopathy:     Cervical: No cervical adenopathy.  Skin:    General: Skin is warm and dry.  Neurological:     General: No focal deficit present.     Mental Status: He is alert.  Psychiatric:        Mood and Affect: Mood normal.        Behavior:  Behavior normal.        Thought Content: Thought content normal.       Assessment & Plan:   Alcohol use disorder, moderate, dependence (HCC) Assessment & Plan: Reporting 3-5 standard drinks daily.  History of prolonged use, at risk of alcohol dependence/overuse. Educated him on the importance of alcohol cessation, reviewed alcohol cessation techniques and recommendations.  Patient verbalized that he knows he needs to quit and is working to cut back the amount of drinks he is having per day.  Advised him to discuss with his psychiatrist for additional resources and medication management.  Patient verbalized understanding and was in agreement with the plan.   Cigarette nicotine dependence with nicotine-induced disorder Assessment & Plan: Patient previously on Trelegy inhaler, however his insurance stopped covering it and reported  the cost was going to be $700 a month.  Patient denies wheezing and/or chest tightness.  Denies chronic cough. Adventitious breath sounds heard on physical exam.  Discussed with patient that we could upgrade his albuterol  inhaler to Airsupra and/for see if his insurance will cover Breztri instead of Trelegy.  Patient was not agreeable to either of these options at this time.  Discussed the importance of smoking cessation.  Discussed treatment options including Chantix  and or nicotine replacement.  Patient is interested in quitting, however would like to do so on his own first.  Patient verbalized that if prescription therapy becomes something that he believes he needs, he will reach out via MyChart for this.  Will revisit discussion at his next visit in 6 months.   Paranoid schizophrenia (HCC) Assessment & Plan: Stable, chronic problem for >11+ years.  Followed by Dr. Eappen who prescribes all of his psych medications including Depakote . Last depakote  level low at 35 (07/12/24).  Advised  him to keep close follow-up with psychiatrist and continue all of his current psych  medications.  Will continue to monitor.    Return in about 6 months (around 01/17/2025) for Mood, go over blood work, asthma/COPD(?).   Saddie JULIANNA Sacks, PA-C

## 2024-07-25 ENCOUNTER — Telehealth: Payer: Self-pay

## 2024-07-25 ENCOUNTER — Other Ambulatory Visit

## 2024-07-25 DIAGNOSIS — Z1329 Encounter for screening for other suspected endocrine disorder: Secondary | ICD-10-CM | POA: Diagnosis not present

## 2024-07-25 DIAGNOSIS — Z7251 High risk heterosexual behavior: Secondary | ICD-10-CM | POA: Diagnosis not present

## 2024-07-25 DIAGNOSIS — Z1159 Encounter for screening for other viral diseases: Secondary | ICD-10-CM | POA: Diagnosis not present

## 2024-07-25 DIAGNOSIS — Z1321 Encounter for screening for nutritional disorder: Secondary | ICD-10-CM | POA: Diagnosis not present

## 2024-07-25 NOTE — Telephone Encounter (Signed)
 Copied from CRM [id number]. Topic: Clinical - Request for Lab/Test Order >> Jul 25, 2024  8:23 AM Everette C wrote: Reason for CRM: The patient has called to request lab orders for checking their routine labs and would like additional labs related to liver function. Please contact the patient further if/when possible

## 2024-07-28 ENCOUNTER — Ambulatory Visit: Payer: Self-pay

## 2024-07-28 LAB — CBC WITH DIFFERENTIAL/PLATELET
Basophils Absolute: 0.1 x10E3/uL (ref 0.0–0.2)
Basos: 1 %
EOS (ABSOLUTE): 0.2 x10E3/uL (ref 0.0–0.4)
Eos: 2 %
Hematocrit: 49.9 % (ref 37.5–51.0)
Hemoglobin: 16.7 g/dL (ref 13.0–17.7)
Immature Grans (Abs): 0 x10E3/uL (ref 0.0–0.1)
Immature Granulocytes: 0 %
Lymphocytes Absolute: 2.1 x10E3/uL (ref 0.7–3.1)
Lymphs: 32 %
MCH: 34.3 pg — ABNORMAL HIGH (ref 26.6–33.0)
MCHC: 33.5 g/dL (ref 31.5–35.7)
MCV: 103 fL — ABNORMAL HIGH (ref 79–97)
Monocytes Absolute: 0.7 x10E3/uL (ref 0.1–0.9)
Monocytes: 11 %
Neutrophils Absolute: 3.6 x10E3/uL (ref 1.4–7.0)
Neutrophils: 54 %
Platelets: 387 x10E3/uL (ref 150–450)
RBC: 4.87 x10E6/uL (ref 4.14–5.80)
RDW: 12.6 % (ref 11.6–15.4)
WBC: 6.7 x10E3/uL (ref 3.4–10.8)

## 2024-07-28 LAB — RPR+HIV+GC+CT PANEL
HIV Screen 4th Generation wRfx: NONREACTIVE
RPR Ser Ql: NONREACTIVE

## 2024-07-28 LAB — COMPREHENSIVE METABOLIC PANEL WITH GFR
ALT: 20 IU/L (ref 0–44)
AST: 21 IU/L (ref 0–40)
Albumin: 4.9 g/dL (ref 4.1–5.1)
Alkaline Phosphatase: 67 IU/L (ref 44–121)
BUN/Creatinine Ratio: 12 (ref 9–20)
BUN: 9 mg/dL (ref 6–20)
Bilirubin Total: 0.5 mg/dL (ref 0.0–1.2)
CO2: 21 mmol/L (ref 20–29)
Calcium: 10.2 mg/dL (ref 8.7–10.2)
Chloride: 101 mmol/L (ref 96–106)
Creatinine, Ser: 0.77 mg/dL (ref 0.76–1.27)
Globulin, Total: 2.8 g/dL (ref 1.5–4.5)
Glucose: 75 mg/dL (ref 70–99)
Potassium: 4.4 mmol/L (ref 3.5–5.2)
Sodium: 140 mmol/L (ref 134–144)
Total Protein: 7.7 g/dL (ref 6.0–8.5)
eGFR: 117 mL/min/1.73 (ref >59)

## 2024-07-28 LAB — LIPID PANEL
Chol/HDL Ratio: 2.3 ratio (ref 0.0–5.0)
Cholesterol, Total: 178 mg/dL (ref 100–199)
HDL: 79 mg/dL (ref >39)
LDL Chol Calc (NIH): 78 mg/dL (ref 0–99)
Triglycerides: 119 mg/dL (ref 0–149)
VLDL Cholesterol Cal: 21 mg/dL (ref 5–40)

## 2024-07-28 LAB — HEMOGLOBIN A1C
Est. average glucose Bld gHb Est-mCnc: 94 mg/dL
Hgb A1c MFr Bld: 4.9 % (ref 4.8–5.6)

## 2024-07-28 LAB — HEPATITIS C ANTIBODY: Hep C Virus Ab: NONREACTIVE

## 2024-07-28 LAB — TSH: TSH: 0.828 u[IU]/mL (ref 0.450–4.500)

## 2024-07-29 ENCOUNTER — Other Ambulatory Visit: Payer: Self-pay | Admitting: Psychiatry

## 2024-07-29 DIAGNOSIS — F411 Generalized anxiety disorder: Secondary | ICD-10-CM

## 2024-07-30 ENCOUNTER — Other Ambulatory Visit: Payer: Self-pay | Admitting: Family Medicine

## 2024-07-30 DIAGNOSIS — F411 Generalized anxiety disorder: Secondary | ICD-10-CM

## 2024-07-30 DIAGNOSIS — F1020 Alcohol dependence, uncomplicated: Secondary | ICD-10-CM

## 2024-08-04 ENCOUNTER — Ambulatory Visit (INDEPENDENT_AMBULATORY_CARE_PROVIDER_SITE_OTHER): Admitting: Professional Counselor

## 2024-08-04 DIAGNOSIS — F200 Paranoid schizophrenia: Secondary | ICD-10-CM | POA: Diagnosis not present

## 2024-08-04 DIAGNOSIS — F411 Generalized anxiety disorder: Secondary | ICD-10-CM

## 2024-08-04 DIAGNOSIS — F17200 Nicotine dependence, unspecified, uncomplicated: Secondary | ICD-10-CM
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
## 2024-08-27 ENCOUNTER — Telehealth (INDEPENDENT_AMBULATORY_CARE_PROVIDER_SITE_OTHER): Admitting: Psychiatry

## 2024-08-27 ENCOUNTER — Encounter: Payer: Self-pay | Admitting: Psychiatry

## 2024-08-27 DIAGNOSIS — F1011 Alcohol abuse, in remission: Secondary | ICD-10-CM | POA: Diagnosis not present

## 2024-08-27 DIAGNOSIS — F200 Paranoid schizophrenia: Secondary | ICD-10-CM | POA: Diagnosis not present

## 2024-08-27 DIAGNOSIS — F411 Generalized anxiety disorder: Secondary | ICD-10-CM

## 2024-08-27 DIAGNOSIS — F17200 Nicotine dependence, unspecified, uncomplicated: Secondary | ICD-10-CM | POA: Diagnosis not present

## 2024-08-27 MED ORDER — NICOTINE 14 MG/24HR TD PT24: 14.0000 mg | MEDICATED_PATCH | Freq: Every day | TRANSDERMAL | 0 refills | Status: DC

## 2024-08-27 MED ORDER — DIVALPROEX SODIUM ER 500 MG PO TB24: 500.0000 mg | ORAL_TABLET | Freq: Every day | ORAL | 0 refills | Status: DC

## 2024-08-27 NOTE — Progress Notes (Unsigned)
 Virtual Visit via Video Note  I connected with Rodney [name] on 08/27/24 at  3:30 PM EDT by a video enabled telemedicine application and verified that I am speaking with the correct person using two identifiers.  Location Provider Location : [facility] Patient Location : Work  Participants: Patient , Provider    I discussed the limitations of evaluation and management by telemedicine and the availability of in person appointments. The patient expressed understanding and agreed to proceed.  I discussed the assessment and treatment plan with the patient. The patient was provided an opportunity to ask questions and all were answered. The patient agreed with the plan and demonstrated an understanding of the instructions.   The patient was advised to call back or seek an in-person evaluation if the symptoms worsen or if the condition fails to improve as anticipated.    BH MD OP Progress Note  08/27/2024 3:52 PM Rodney [name]  MRN:  [id number]  Chief Complaint:  Chief Complaint  Patient presents with   Follow-up   Depression   Medication Refill   Schizophrenia   Discussed the use of AI scribe software for clinical note transcription with the patient, who gave verbal consent to proceed.  History of Present Illness Rodney [name] is a 38 year old here for follow-up.  He describes feeling pretty good overall and notes improvement in his mental state since his last visit. He initially felt agitated after stopping a medication, but he states that he has come to terms with it and feels better regarding anger and anxiety. He denies hearing voices or experiencing psychosis.  He reported that he was taking risperidone  0.25 mg, Depakote  750 mg daily, gabapentin , and using benztropine  as needed prior to this visit. He confirms daily use of Depakote  and risperidone  and maintains a sufficient supply of his medications. He reports discontinuing venlafaxine  12.5 mg. He denies any thoughts of harming himself or  others.  Psychiatric History: He previously took venlafaxine  12.5 mg, which he has now discontinued.  Substance History: He reports current alcohol use, typically consuming approximately 4 drinks on Fridays or Saturdays when not working, and 1 or 2 drinks on other days if he drinks. He describes this pattern as occasional and primarily social. He notes a period of sobriety lasting almost 2 months prior to resuming current use. He denies current or past use of cannabis or other illicit drugs. He expresses interest in smoking cessation and previously used nicotine  patches, preferring them over varenicline  due to side effects.  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 He spends weekends with his daughter and lives alone.   Visit Diagnosis:    ICD-10-CM   1. Paranoid schizophrenia (HCC)  F20.0 divalproex  (DEPAKOTE  ER) 500 MG 24 hr tablet    2. GAD (generalized anxiety disorder)  F41.1     3. Alcohol use disorder, mild, in early remission  F10.11     4. Tobacco use disorder  F17.200 nicotine  (NICODERM CQ  - DOSED IN MG/24 HOURS) 14 mg/24hr patch   Moderate      Past Psychiatric History: I have reviewed past psychiatric history from progress note on 09/23/2018.  Past Medical History:  Past Medical History:  Diagnosis Date   Anxiety    Asthma    Bipolar disorder (HCC)    CIGARETTE SMOKER 02/08/2011   Qualifier: Diagnosis of  By: Vicci MD, [place]     Seizures [facility])     Past Surgical History:  Procedure Laterality Date  COLONOSCOPY WITH PROPOFOL  N/A 02/28/2024   Procedure: COLONOSCOPY WITH PROPOFOL ;  Surgeon: Therisa Bi, MD;  Location: [facility] END[facility]COPY;  Service: Gastroenterology;  Laterality: N/A;   WISDOM TOOTH EXTRACTION      Family Psychiatric History: I have reviewed family psychiatric history from progress note on 09/23/2018.  Family History:  Family History  Problem Relation Age of Onset   Anxiety disorder Mother    OCD Brother     Anxiety disorder Maternal Aunt    Anxiety disorder Maternal Grandmother    Anxiety disorder Paternal Grandfather     Social History: I have reviewed social history from progress note on 09/23/2018. Social History   Socioeconomic History   Marital status: Significant Other    Spouse name: Not on file   Number of children: 1   Years of education: Not on file   Highest education level: Associate degree: occupational, Scientist, product/process development, or vocational program  Occupational History   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    Occupation: Shipping and receiving  Tobacco Use   Smoking status: Every Day    Current packs/day: 0.50    Average packs/day: 2.0 packs/day for 21.7 years (43.1 ttl pk-yrs)    Types: Cigarettes    Start date: 2004   Smokeless tobacco: Never  Vaping Use   Vaping status: Former  Substance and Sexual Activity   Alcohol use: Yes    Alcohol/week: 2.0 standard drinks of alcohol    Types: 2 Cans of beer per week    Comment: weekly.none last 24hrs   Drug use: Not Currently    Types: Marijuana, Other-see comments, LSD    Comment: in past   Sexual activity: Yes    Birth control/protection: None  Other Topics Concern   Not on file  Social History Narrative   Not on file   Social Drivers of Health   Financial Resource Strain: Low Risk  (02/01/2024)   Overall Financial Resource Strain (CARDIA)    Difficulty of Paying Living Expenses: Not very hard  Food Insecurity: No Food Insecurity (02/01/2024)   Hunger Vital Sign    Worried About Running Out of Food in the Last Year: Never true    Ran Out of Food in the Last Year: Never true  Transportation Needs: No Transportation Needs (02/01/2024)   PRAPARE - Administrator, Civil Service (Medical): No    Lack of Transportation (Non-Medical): No  Physical Activity: Sufficiently Active (02/01/2024)   Exercise Vital Sign    Days of Exercise per Week: 5 days    Minutes of Exercise per Session: 120 min  Stress:  Stress Concern Present (02/01/2024)   [employer] of Occupational Health - Occupational Stress Questionnaire    Feeling of Stress : To some extent  Social Connections: Moderately Isolated (02/01/2024)   Social Connection and Isolation Panel    Frequency of Communication with Friends and Family: More than three times a week    Frequency of Social Gatherings with Friends and Family: Three times a week    Attends Religious Services: More than 4 times per year    Active Member of Clubs or Organizations: No    Attends Banker Meetings: Never    Marital Status: Never married    Allergies:  Allergies  Allergen Reactions   Hydroxyzine Hcl Rash    Metabolic Disorder Labs: Lab Results  Component Value Date   HGBA1C 4.9 07/25/2024   MPG 105.41 06/05/2023   MPG 105 03/04/2020   Lab  Results  Component Value Date   PROLACTIN 11.4 06/05/2023   Lab Results  Component Value Date   CHOL 178 07/25/2024   TRIG 119 07/25/2024   HDL 79 07/25/2024   CHOLHDL 2.3 07/25/2024   VLDL 20 06/05/2023   LDLCALC 78 07/25/2024   LDLCALC 102 (H) 06/05/2023   Lab Results  Component Value Date   TSH 0.828 07/25/2024   TSH 0.850 06/05/2023    Therapeutic Level Labs: No results found for: LITHIUM Lab Results  Component Value Date   VALPROATE 36 (L) 07/09/2024   VALPROATE 64 06/05/2023   No results found for: CBMZ  Current Medications: Current Outpatient Medications  Medication Sig Dispense Refill   nicotine  (NICODERM CQ  - DOSED IN MG/24 HOURS) 14 mg/24hr patch Place 1 patch (14 mg total) onto the skin daily. 28 patch 0   albuterol  (VENTOLIN  HFA) 108 (90 Base) MCG/ACT inhaler Inhale 2 puffs into the lungs every 6 (six) hours as needed for wheezing or shortness of breath. 8.5 each 3   benztropine  (COGENTIN ) 1 MG tablet TAKE 1 TABLET (1 MG TOTAL) BY MOUTH 2 (TWO) TIMES DAILY. FOR SIDE EFFECTS OF RISPERIDONE  180 tablet 0   cyclobenzaprine  (FLEXERIL ) 10 MG tablet Take 1 tablet (10  mg total) by mouth 2 (two) times daily as needed for muscle spasms. (Patient not taking: Reported on 07/17/2024) 20 tablet 0   dicyclomine  (BENTYL ) 10 MG capsule Take 1 capsule (10 mg total) by mouth 4 (four) times daily -  before meals and at bedtime. (Patient not taking: Reported on 07/17/2024) 30 capsule 5   divalproex  (DEPAKOTE  ER) 500 MG 24 hr tablet Take 1 tablet (500 mg total) by mouth daily. 90 tablet 0   Fluticasone -Umeclidin-Vilant (TRELEGY ELLIPTA ) 200-62.5-25 MCG/ACT AEPB Inhale 1 puff into the lungs daily. (Patient not taking: Reported on 07/17/2024) 60 each 11   gabapentin  (NEURONTIN ) 600 MG tablet TAKE 1 TABLET BY MOUTH THREE TIMES A DAY 270 tablet 0   Na Sulfate-K Sulfate-Mg Sulfate concentrate (SUPREP) 17.5-3.13-1.6 GM/177ML SOLN See admin instructions. (Patient not taking: Reported on 07/17/2024)     predniSONE  (DELTASONE ) 20 MG tablet Take 2 tablets (40 mg total) by mouth daily. (Patient not taking: Reported on 07/17/2024) 10 tablet 0   risperiDONE  (RISPERDAL ) 0.25 MG tablet Take 1 tablet (0.25 mg total) by mouth at bedtime. Dose change 90 tablet 0   triamcinolone  (NASACORT ) 55 MCG/ACT AERO nasal inhaler PLACE 1 SPRAY INTO THE NOSE 2 (TWO) TIMES DAILY. 16.9 each 12   No current facility-administered medications for this visit.     Musculoskeletal: Strength & Muscle Tone: UTA Gait & Station: normal Patient leans: N/A  Psychiatric Specialty Exam: Review of Systems  Psychiatric/Behavioral: Negative.      There were no vitals taken for this visit.There is no height or weight on file to calculate BMI.  General Appearance: Casual  Eye Contact:  Fair  Speech:  Clear and Coherent  Volume:  Normal  Mood:  Euthymic  Affect:  Congruent  Thought Process:  Goal Directed and Descriptions of Associations: Intact  Orientation:  Full (Time, Place, and Person)  Thought Content: Logical   Suicidal Thoughts:  No  Homicidal Thoughts:  No  Memory:  Immediate;   Fair Recent;    Fair Remote;   Fair  Judgement:  Fair  Insight:  Fair  Psychomotor Activity:  Normal  Concentration:  Concentration: Fair and Attention Span: Fair  Recall:  [employer] of Knowledge: Fair  Language: Fair  Akathisia:  No  Handed:  Right  AIMS (if indicated): not done  Assets:  Communication Skills Desire for Improvement Housing Social Support Transportation Vocational/Educational  ADL's:  Intact  Cognition: WNL  Sleep:  Fair   Screenings: Geneticist, molecular Office Visit from 12/27/2023 in [facility] Office Visit from 10/10/2023 in [facility] Office Visit from 08/07/2023 in [facility] Office Visit from 02/07/2023 in [facility] Video Visit from 10/05/2022 in [facility]  AIMS Total Score 0 0 0 0 0   AUDIT    Flowsheet Row Video Visit from 08/01/2022 in [facility]  Alcohol Use Disorder Identification Test Final Score (AUDIT) 15   GAD-7    Flowsheet Row Office Visit from 07/17/2024 in [facility] Counselor from 07/09/2024 in [facility] Counselor from 02/01/2024 in [facility] Office Visit from 12/27/2023 in [facility] Office Visit from 10/10/2023 in [facility]  Total GAD-7 Score 0 6 2 8 9    PHQ2-9    Flowsheet Row Office Visit from 07/17/2024 in [facility] Counselor from 02/01/2024 in [facility] Office Visit from 12/27/2023 in [facility] Office Visit from 10/10/2023 in [facility] Office Visit from 10/02/2023  in [facility]  PHQ-2 Total Score 0 0 0 0 1  PHQ-9 Total Score -- 2 -- -- 1   Flowsheet Row Video Visit from 06/26/2024 in [facility] Video Visit from 06/05/2024 in [facility] Admission (Discharged) from 02/28/2024 in [name]  C-SSRS RISK CATEGORY No Risk No Risk No Risk     Assessment and Plan: Rodney [name] is a 38 year old Caucasian male who has a history of schizophrenia, alcohol use disorder, cannabis abuse in remission was evaluated by telemedicine today.  Discussed assessment and plan as noted below.  Schizophrenia in remission Currently denies any significant concerns and is tolerating the lower dosage of risperidone .  Interested in coming off of the Depakote  to reduce polypharmacy as well as concerns about long-term side effects. Continue risperidone  0.25 mg at bedtime. Continue benztropine  1 mg twice a day as needed for side effects. Will reduce Depakote  to 500 mg daily.  Most recent Depakote  level-(07/09/2024-36-subtherapeutic)  Generalized anxiety disorder-stable Currently reports anxiety symptoms are stable was able to successfully come off of the venlafaxine  without any worsening symptoms Continue psychotherapy sessions with Ms. Veva   Alcohol use disorder in partial remission Continues to use alcohol socially. Provided counseling.  Tobacco use disorder moderate-unstable Interested in smoking cessation. Provided counseling for 1 minute.  Start nicotine  patch 14 mg.   Follow-up Follow-up in clinic in 8 weeks or sooner in person.  Collaboration of Care: Collaboration of Care: {BH OP Collaboration of Care:[id number]}  Patient/Guardian was advised Release of Information must be obtained prior to any record release in order to collaborate their care with an outside provider. Patient/Guardian was advised if they have not already done so  to contact the registration department to sign all necessary forms in order for us  to release information regarding their care.   Consent: Patient/Guardian gives verbal consent for treatment and assignment of  benefits for services provided during this visit. Patient/Guardian expressed understanding and agreed to proceed.    Kiylah Loyer, MD 08/27/2024, 3:52 PM

## 2024-09-02 ENCOUNTER — Ambulatory Visit (INDEPENDENT_AMBULATORY_CARE_PROVIDER_SITE_OTHER): Admitting: Professional Counselor

## 2024-09-02 DIAGNOSIS — F411 Generalized anxiety disorder: Secondary | ICD-10-CM

## 2024-09-02 DIAGNOSIS — F200 Paranoid schizophrenia: Secondary | ICD-10-CM | POA: Diagnosis not present
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
## 2024-09-15 ENCOUNTER — Telehealth: Payer: Self-pay

## 2024-09-15 ENCOUNTER — Other Ambulatory Visit: Payer: Self-pay

## 2024-09-15 DIAGNOSIS — F17200 Nicotine dependence, unspecified, uncomplicated: Secondary | ICD-10-CM

## 2024-09-15 MED ORDER — NICOTINE 21 MG/24HR TD PT24
21.0000 mg | MEDICATED_PATCH | Freq: Every day | TRANSDERMAL | 2 refills | Status: AC
Start: 2024-09-15 — End: ?

## 2024-09-15 NOTE — Telephone Encounter (Signed)
Message forward to provider.

## 2024-09-15 NOTE — Addendum Note (Signed)
 Addended byBETHA GAYLE NUMBERS on: 09/15/2024 04:48 PM   Modules accepted: Orders

## 2024-09-17 ENCOUNTER — Ambulatory Visit: Admitting: Professional Counselor

## 2024-10-08 ENCOUNTER — Ambulatory Visit (INDEPENDENT_AMBULATORY_CARE_PROVIDER_SITE_OTHER): Admitting: Professional Counselor

## 2024-10-08 DIAGNOSIS — F17210 Nicotine dependence, cigarettes, uncomplicated: Secondary | ICD-10-CM | POA: Diagnosis not present

## 2024-10-08 DIAGNOSIS — F17200 Nicotine dependence, unspecified, uncomplicated: Secondary | ICD-10-CM

## 2024-10-08 DIAGNOSIS — F1011 Alcohol abuse, in remission: Secondary | ICD-10-CM | POA: Diagnosis not present

## 2024-10-08 DIAGNOSIS — F411 Generalized anxiety disorder: Secondary | ICD-10-CM

## 2024-10-08 DIAGNOSIS — F200 Paranoid schizophrenia: Secondary | ICD-10-CM | POA: Diagnosis not present

## 2024-10-08 NOTE — Progress Notes (Unsigned)
 THERAPIST PROGRESS NOTE  Virtual Visit via Video Note  I connected with Rodney [name] on 10/09/24 at  4:00 PM EDT by a video enabled telemedicine application and verified that I am speaking with the correct person using two identifiers.  Location: Patient: Work Provider: Office    I discussed the limitations of evaluation and management by telemedicine and the availability of in person appointments. The patient expressed understanding and agreed to proceed.   I discussed the assessment and treatment plan with the patient. The patient was provided an opportunity to ask questions and all were answered. The patient agreed with the plan and demonstrated an understanding of the instructions.   The patient was advised to call back or seek an in-person evaluation if the symptoms worsen or if the condition fails to improve as anticipated.  I provided 40 minutes of non-face-to-face time during this encounter. Rodney [name], [facility]  Session Time: 4:02 PM - 4:42 PM  Participation Level: Active  Behavioral Response: Casual, Alert, Euthymic  Type of Therapy: Individual Therapy  Treatment Goals addressed: Active Anxiety  LTG: I guess just to be financially sound, be healthy.  (Progressing)                Start:  03/13/24    Expected End:  03/12/25    Goal Note Right now as far as that goes I'm trying to bring up my credit score and being able to use home equity with my mortgage. I need to consolidate pretty much credit cards and stuff like that.    STG: I get a bit anxious at times, certain situations. To reduce symptoms of anxiety AEB reduction in GAD7 scores by utilizing coping mechanisms and restructuring maladaptive patterns of thinking over the next 12 weeks. (Progressing)  Goal Note I'd say all and all pretty good.   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 Drinking and smoking is going well, gambling is not going well. To reduce addictive behaviors AEB identifying and  reducing triggers and implementing positive replacement behaviors over the next 12 weeks.  (Progressing)  Goal Note I feel like what I did accomplish was really good and it made me feel like I can do those things. Right now, I'm on a bit of a side road but I plan on jumping back in and moderate or eliminate things in the future. I scratch that itch (gambling) occasionally but it's not like it was, an every day thing.   STG: I guess thought blocking or brain fog. To improve mindfulness AEB decrease in brain fog by utilizing mindfulness skills 3 out of 7 days a week for the next 12 weeks.  (Progressing)  Goal Note That's just random like when it will happen but I'm leaning towards it's gotten a little bit better.   ProgressTowards Goals: Progressing  Interventions: Motivational Interviewing and Supportive  Summary: Rodney [name] is a 38 y.o. male who presents with a history of anxiety, schizophrenia, tobacco, and alcohol use disorder. He appeared alert and oriented x5. Mayan was still at work during session but confirmed he can continue with appointment as scheduled. He shared updates with custody arrangement with his ex. He is feeling good about their current structure and noted they are both complying with drug/alcohol testing, which he has provided for. They will go to mediation soon to discuss other custody needs. He noted some issues with behavior and was receptive to parenting tips on improving compliance. He noted he will also be  required to do a parenting class for the court. Brekken stated work is going well and overall he is feeling hopeful about things. He has set a date for smoking cessation and feels confident in his ability to do this. He has plans for his 38th birthday this weekend. Connection ended prior to formally concluding session and phone went straight to VM when called by this Clinical research associate.   Therapist Response: Conducted session with [employer]. Began session with check-in/update since previous  session. Utilized empathetic and reflective listening. Used open-ended questions to facilitate discussion and summarized Hawley's thoughts/feelings. Shared tips on parenting - providing choices and making things into a game. Encouraged Chananya's plans for smoking cessation.  When session disconnected, I attempted to call 3x and immediately received VM. Requested front desk staff to contact at later time to schedule follow-up appointment.   Suicidal/Homicidal: No  Plan: Reschedule as needed  Diagnosis: GAD (generalized anxiety disorder)  Paranoid schizophrenia (HCC)  Tobacco use disorder  Alcohol use disorder, mild, in early remission  Collaboration of Care: Medication Management AEB chart review  Patient/Guardian was advised Release of Information must be obtained prior to any record release in order to collaborate their care with an outside provider. Patient/Guardian was advised if they have not already done so to contact the registration department to sign all necessary forms in order for us  to release information regarding their care.   Consent: Patient/Guardian gives verbal consent for treatment and assignment of benefits for services provided during this visit. Patient/Guardian expressed understanding and agreed to proceed.   Rodney [name], [facility] 10/09/2024

## 2024-10-15 DIAGNOSIS — F200 Paranoid schizophrenia: Secondary | ICD-10-CM

## 2024-10-15 MED ORDER — RISPERIDONE 0.25 MG PO TABS: 0.2500 mg | ORAL_TABLET | Freq: Every day | ORAL | 0 refills | Status: DC

## 2024-10-22 ENCOUNTER — Ambulatory Visit: Admitting: Psychiatry

## 2024-10-22 ENCOUNTER — Other Ambulatory Visit: Payer: Self-pay

## 2024-10-22 ENCOUNTER — Encounter: Payer: Self-pay | Admitting: Psychiatry

## 2024-10-22 VITALS — BP 124/80 | HR 73 | Temp 97.4°F | Ht 77.0 in | Wt 195.8 lb

## 2024-10-22 DIAGNOSIS — F209 Schizophrenia, unspecified: Secondary | ICD-10-CM

## 2024-10-22 DIAGNOSIS — F411 Generalized anxiety disorder: Secondary | ICD-10-CM | POA: Diagnosis not present

## 2024-10-22 DIAGNOSIS — F1011 Alcohol abuse, in remission: Secondary | ICD-10-CM | POA: Diagnosis not present

## 2024-10-22 DIAGNOSIS — F17200 Nicotine dependence, unspecified, uncomplicated: Secondary | ICD-10-CM | POA: Diagnosis not present

## 2024-10-22 MED ORDER — RISPERIDONE 0.25 MG PO TABS
0.2500 mg | ORAL_TABLET | ORAL | Status: DC
Start: 2024-10-22 — End: 2024-11-10

## 2024-10-22 NOTE — Progress Notes (Signed)
 [facility] MD OP Progress Note  10/22/2024 4:59 PM Rodney [name]  MRN:  [id number]  Chief Complaint:  Chief Complaint  Patient presents with   Follow-up   Medication Refill   Schizophrenia   Anxiety   Discussed the use of AI scribe software for clinical note transcription with the patient, who gave verbal consent to proceed.  History of Present Illness Rodney [name] is a 38 year old Caucasian male, employed, separated from wife, lives in [place], has a history of schizophrenia, GAD, alcohol use disorder, cannabis abuse in remission, tobacco use disorder was evaluated in office today for a follow-up appointment.  Since his last appointment in September, he reports doing quite well and describes ongoing improvement in mood and functioning. After stopping venlafaxine  previously, he notes that initial agitation resolved and he has continued to feel better. He denies experiencing auditory or visual hallucinations. His current medications include risperidone  0.25 mg and Depakote  500 mg daily, and he reports that his Depakote  dose was recently lowered. He expresses interest in reducing the number of medications he takes. He also takes Cogentin  as needed on the same days as risperidone . His primary care provider prescribed gabapentin , and he reports that gabapentin  remains part of his regimen at 600 mg daily (half a pill in the morning and half in the afternoon) which was recently reduced.  He reports sleeping well, especially with recent rainy weather. Increased activity, including housework and occasional workouts at home or at his workplace gym, has become part of his routine. He states he spends time with friends occasionally and is more active overall. He manages his own meals, focuses on healthy eating, reduces processed foods and sugar, and limits soda intake to 1 12-ounce Coca Cola per day.  He describes ongoing adjustment to a recent separation from his wife, which he states was mutual and has resulted in  improved well-being for himself and his family. He reports missing his stepchildren, whom he helped raise, but maintains regular contact with his biological daughter and follows a structured custody schedule. He continues to attend therapy with Ms. Almarie Monas and finds it helpful.  He has a history of schizophrenia diagnosis, and he reports that episodes of psychosis occurred several times in the past, which he reports resulted from psychedelic drug use and lack of sleep.  Hence this needs to be explored in future sessions.  He is interested in coming off of the risperidone  since he has not had any psychosis in a long time and he is concerned about long-term side effects of risperidone .  He reports current alcohol use, and he describes this as rarely and typically every other weekend. He denies binge drinking. He reports current cigarette use, approximately 15 to 20 cigarettes daily. He is not currently using a nicotine  patch but has set a quit date and plans to resume nicotine  replacement therapy.    Visit Diagnosis:    ICD-10-CM   1. Schizophrenia in remission (HCC)  F20.9 risperiDONE  (RISPERDAL ) 0.25 MG tablet    2. GAD (generalized anxiety disorder)  F41.1     3. Alcohol use disorder, mild, in early remission  F10.11     4. Tobacco use disorder  F17.200    Moderate      Past Psychiatric History: I have reviewed past psychiatric history from progress note on 09/23/2018.  Past Medical History:  Past Medical History:  Diagnosis Date   Anxiety    Asthma    Bipolar disorder (HCC)    CIGARETTE SMOKER 02/08/2011  Qualifier: Diagnosis of  By: Vicci MD, [place]     Seizures [facility])     Past Surgical History:  Procedure Laterality Date   COLONOSCOPY WITH PROPOFOL  N/A 02/28/2024   Procedure: COLONOSCOPY WITH PROPOFOL ;  Surgeon: Therisa Bi, MD;  Location: [facility] ENDOSCOPY;  Service: Gastroenterology;  Laterality: N/A;   WISDOM TOOTH EXTRACTION      Family Psychiatric History: I have  reviewed family psychiatric history from progress note on 09/23/2018.  Family History:  Family History  Problem Relation Age of Onset   Anxiety disorder Mother    OCD Brother    Anxiety disorder Maternal Aunt    Anxiety disorder Maternal Grandmother    Anxiety disorder Paternal Grandfather     Social History: Reviewed social history from progress note on 09/23/2018. Social History   Socioeconomic History   Marital status: Significant Other    Spouse name: Not on file   Number of children: 1   Years of education: Not on file   Highest education level: Associate degree: occupational, scientist, product/process development, or vocational program  Occupational History   Occupation: location manager     Comment: armed forces operational officer    Occupation: Shipping and receiving  Tobacco Use   Smoking status: Every Day    Current packs/day: 0.50    Average packs/day: 2.0 packs/day for 21.8 years (43.2 ttl pk-yrs)    Types: Cigarettes    Start date: 2004   Smokeless tobacco: Never  Vaping Use   Vaping status: Former  Substance and Sexual Activity   Alcohol use: Yes    Alcohol/week: 2.0 standard drinks of alcohol    Types: 2 Cans of beer per week    Comment: weekly.none last 24hrs   Drug use: Not Currently    Types: Marijuana, Other-see comments, LSD    Comment: in past   Sexual activity: Yes    Birth control/protection: None  Other Topics Concern   Not on file  Social History Narrative   Not on file   Social Drivers of Health   Financial Resource Strain: Low Risk  (02/01/2024)   Overall Financial Resource Strain (CARDIA)    Difficulty of Paying Living Expenses: Not very hard  Food Insecurity: No Food Insecurity (02/01/2024)   Hunger Vital Sign    Worried About Running Out of Food in the Last Year: Never true    Ran Out of Food in the Last Year: Never true  Transportation Needs: No Transportation Needs (02/01/2024)   PRAPARE - Administrator, Civil Service (Medical): No    Lack of Transportation  (Non-Medical): No  Physical Activity: Sufficiently Active (02/01/2024)   Exercise Vital Sign    Days of Exercise per Week: 5 days    Minutes of Exercise per Session: 120 min  Stress: Stress Concern Present (02/01/2024)   [employer] of Occupational Health - Occupational Stress Questionnaire    Feeling of Stress : To some extent  Social Connections: Moderately Isolated (02/01/2024)   Social Connection and Isolation Panel    Frequency of Communication with Friends and Family: More than three times a week    Frequency of Social Gatherings with Friends and Family: Three times a week    Attends Religious Services: More than 4 times per year    Active Member of Clubs or Organizations: No    Attends Banker Meetings: Never    Marital Status: Never married    Allergies:  Allergies  Allergen Reactions   Hydroxyzine Hcl Rash    Metabolic Disorder  Labs: Lab Results  Component Value Date   HGBA1C 4.9 07/25/2024   MPG 105.41 06/05/2023   MPG 105 03/04/2020   Lab Results  Component Value Date   PROLACTIN 11.4 06/05/2023   Lab Results  Component Value Date   CHOL 178 07/25/2024   TRIG 119 07/25/2024   HDL 79 07/25/2024   CHOLHDL 2.3 07/25/2024   VLDL 20 06/05/2023   LDLCALC 78 07/25/2024   LDLCALC 102 (H) 06/05/2023   Lab Results  Component Value Date   TSH 0.828 07/25/2024   TSH 0.850 06/05/2023    Therapeutic Level Labs: No results found for: LITHIUM Lab Results  Component Value Date   VALPROATE 36 (L) 07/09/2024   VALPROATE 64 06/05/2023   No results found for: CBMZ  Current Medications: Current Outpatient Medications  Medication Sig Dispense Refill   gabapentin  (NEURONTIN ) 600 MG tablet TAKE 1 TABLET BY MOUTH THREE TIMES A DAY (Patient taking differently: Take 300 mg by mouth 2 (two) times daily.) 270 tablet 0   albuterol  (VENTOLIN  HFA) 108 (90 Base) MCG/ACT inhaler Inhale 2 puffs into the lungs every 6 (six) hours as needed for wheezing or  shortness of breath. 8.5 each 3   benztropine  (COGENTIN ) 1 MG tablet TAKE 1 TABLET (1 MG TOTAL) BY MOUTH 2 (TWO) TIMES DAILY. FOR SIDE EFFECTS OF RISPERIDONE  180 tablet 0   cyclobenzaprine  (FLEXERIL ) 10 MG tablet Take 1 tablet (10 mg total) by mouth 2 (two) times daily as needed for muscle spasms. (Patient not taking: Reported on 07/17/2024) 20 tablet 0   dicyclomine  (BENTYL ) 10 MG capsule Take 1 capsule (10 mg total) by mouth 4 (four) times daily -  before meals and at bedtime. (Patient not taking: Reported on 07/17/2024) 30 capsule 5   divalproex  (DEPAKOTE  ER) 500 MG 24 hr tablet Take 1 tablet (500 mg total) by mouth daily. 90 tablet 0   Fluticasone -Umeclidin-Vilant (TRELEGY ELLIPTA ) 200-62.5-25 MCG/ACT AEPB Inhale 1 puff into the lungs daily. (Patient not taking: Reported on 07/17/2024) 60 each 11   Na Sulfate-K Sulfate-Mg Sulfate concentrate (SUPREP) 17.5-3.13-1.6 GM/177ML SOLN See admin instructions. (Patient not taking: Reported on 07/17/2024)     nicotine  (NICODERM CQ  - DOSED IN MG/24 HOURS) 21 mg/24hr patch Place 1 patch (21 mg total) onto the skin daily. 28 patch 2   predniSONE  (DELTASONE ) 20 MG tablet Take 2 tablets (40 mg total) by mouth daily. (Patient not taking: Reported on 07/17/2024) 10 tablet 0   risperiDONE  (RISPERDAL ) 0.25 MG tablet Take 1 tablet (0.25 mg total) by mouth as directed. Take every other day     triamcinolone  (NASACORT ) 55 MCG/ACT AERO nasal inhaler PLACE 1 SPRAY INTO THE NOSE 2 (TWO) TIMES DAILY. 16.9 each 12   No current facility-administered medications for this visit.     Musculoskeletal: Strength & Muscle Tone: within normal limits Gait & Station: normal Patient leans: N/A  Psychiatric Specialty Exam: Review of Systems  Psychiatric/Behavioral: Negative.      Blood pressure 124/80, pulse 73, temperature (!) 97.4 F (36.3 C), temperature source Temporal, height 6' 5 (1.956 m), weight 195 lb 12.8 oz (88.8 kg).Body mass index is 23.22 kg/m.  General  Appearance: Casual  Eye Contact:  Fair  Speech:  Normal Rate  Volume:  Normal  Mood:  Euthymic  Affect:  Congruent  Thought Process:  Goal Directed and Descriptions of Associations: Intact  Orientation:  Full (Time, Place, and Person)  Thought Content: Logical   Suicidal Thoughts:  No  Homicidal Thoughts:  No  Memory:  Immediate;   Fair Recent;   Fair Remote;   Fair  Judgement:  Fair  Insight:  Fair  Psychomotor Activity:  Normal  Concentration:  Concentration: Fair and Attention Span: Fair  Recall:  [employer] of Knowledge: Fair  Language: Fair  Akathisia:  No  Handed:  Right  AIMS (if indicated): done  Assets:  Communication Skills Desire for Improvement Housing Social Support Transportation  ADL's:  Intact  Cognition: WNL  Sleep:  Fair   Screenings: Geneticist, Molecular Office Visit from 12/27/2023 in [facility] Office Visit from 10/10/2023 in [facility] Office Visit from 08/07/2023 in [facility] Office Visit from 02/07/2023 in [facility] Video Visit from 10/05/2022 in [facility]  AIMS Total Score 0 0 0 0 0   AUDIT    Flowsheet Row Video Visit from 08/01/2022 in [facility]  Alcohol Use Disorder Identification Test Final Score (AUDIT) 15   GAD-7    Flowsheet Row Office Visit from 10/22/2024 in [facility] Office Visit from 07/17/2024 in [facility] at [facility] Counselor from 07/09/2024 in [facility] Counselor from 02/01/2024 in [facility] Office Visit from 12/27/2023 in [facility]  Total GAD-7 Score 0 0 6 2 8    PHQ2-9    Flowsheet Row Office Visit  from 10/22/2024 in [facility] Office Visit from 07/17/2024 in [facility] Counselor from 02/01/2024 in [facility] Office Visit from 12/27/2023 in [facility] Office Visit from 10/10/2023 in [facility]  PHQ-2 Total Score 0 0 0 0 0  PHQ-9 Total Score -- -- 2 -- --   Flowsheet Row Office Visit from 10/22/2024 in [facility] Video Visit from 08/27/2024 in [facility] Video Visit from 06/26/2024 in [facility]  C-SSRS RISK CATEGORY No Risk No Risk No Risk     Assessment and Plan: Rodney [name] is a 38 year old Caucasian male who presented for a follow-up appointment, discussed assessment and plan as noted below.  1. Schizophrenia in remission [facility]) Previous history of being diagnosed with schizophrenia however that happened in the context of using hallucinogens and lack of sleep.  Has not had psychosis in a very long time and currently not abusing any substances.  Interested in coming off of the risperidone . Reduce Risperidone  to 0.25 mg every other day for the next 2 weeks. Continue Benztropine  1 mg, could use it for side effects of risperidone . Continue Depakote  500 mg daily, reduced dosage. (Depakote  level dated 07/09/2024-36, subtherapeutic) Continue Gabapentin  as prescribed by primary care provider, currently taking 600 mg daily  2. GAD (generalized anxiety disorder)-stable Currently denies any significant anxiety symptoms Continue psychotherapy sessions with Ms. Almarie Ligas  3. Alcohol use disorder, mild, in early remission Currently reports using alcohol limitedly. Will reevaluate in future sessions  4. Tobacco use disorder-unstable Interested in quitting and has set up a quit  date. Provided counseling for 1 minute. Continue Nicotine  patch 21 mg, patient encouraged to start using it.  Follow-up Follow-up in clinic in 2 weeks or sooner if needed.  Patient to monitor  symptoms closely and to restart risperidone  0.25 mg daily with any worsening symptoms.   Collaboration of Care: Collaboration of Care: Referral or follow-up with counselor/therapist AEB encouraged to follow-up with therapist as scheduled  Patient/Guardian was advised Release of Information must be obtained prior to any record release in order to collaborate their care with an outside provider. Patient/Guardian was advised if they have not already done so to contact the registration department to sign all necessary forms in order for us  to release information regarding their care.   Consent: Patient/Guardian gives verbal consent for treatment and assignment of benefits for services provided during this visit. Patient/Guardian expressed understanding and agreed to proceed.   This note was generated in part or whole with voice recognition software. Voice recognition is usually quite accurate but there are transcription errors that can and very often do occur. I apologize for any typographical errors that were not detected and corrected.    Jaculin Rasmus, MD 10/23/2024, 9:11 AM

## 2024-11-10 ENCOUNTER — Telehealth: Admitting: Psychiatry

## 2024-11-10 ENCOUNTER — Encounter: Payer: Self-pay | Admitting: Psychiatry

## 2024-11-10 DIAGNOSIS — F17200 Nicotine dependence, unspecified, uncomplicated: Secondary | ICD-10-CM | POA: Diagnosis not present

## 2024-11-10 DIAGNOSIS — F411 Generalized anxiety disorder: Secondary | ICD-10-CM

## 2024-11-10 DIAGNOSIS — F1011 Alcohol abuse, in remission: Secondary | ICD-10-CM | POA: Diagnosis not present

## 2024-11-10 DIAGNOSIS — F209 Schizophrenia, unspecified: Secondary | ICD-10-CM

## 2024-11-10 MED ORDER — DIVALPROEX SODIUM ER 500 MG PO TB24: 500.0000 mg | ORAL_TABLET | Freq: Every day | ORAL | 0 refills | Status: AC

## 2024-11-10 NOTE — Progress Notes (Unsigned)
 Virtual Visit via Video Note  I connected with Rodney [name] on 11/10/24 at  4:00 PM EST by a video enabled telemedicine application and verified that I am speaking with the correct person using two identifiers.  Location Provider Location : [facility] Patient Location : Home  Participants: Patient , Provider    I discussed the limitations of evaluation and management by telemedicine and the availability of in person appointments. The patient expressed understanding and agreed to proceed.   I discussed the assessment and treatment plan with the patient. The patient was provided an opportunity to ask questions and all were answered. The patient agreed with the plan and demonstrated an understanding of the instructions.   The patient was advised to call back or seek an in-person evaluation if the symptoms worsen or if the condition fails to improve as anticipated.   BH MD OP Progress Note  11/10/2024 4:21 PM Rodney [name]  MRN:  [id number]  Chief Complaint:  Chief Complaint  Patient presents with   Follow-up   Medication Refill   Anxiety   Schizophrenia   Discussed the use of AI scribe software for clinical note transcription with the patient, who gave verbal consent to proceed.  History of Present Illness Rodney [name] is a 38 year old Caucasian male, employed, separated from wife, lives in [place], has a history of schizophrenia, GAD, alcohol use disorder, cannabis abuse in remission, tobacco use disorder was evaluated by telemedicine today.  He reports feeling well and describes no recurrence of psychosis, paranoia, mood swings, sleep problems, anxiety, depression, irritability, or anger since his last visit. His mood remains stable, and he has not noticed any worsening of symptoms. He describes discontinuing risperidone  approximately 5 or 6 days ago and stopping Cogentin  around the same time. He continues Depakote  at a dose of 500 mg. He continues ongoing engagement in therapy with  Ms.Elizabeth Veva and has a scheduled appointment next Monday.  He reports current tobacco use, having had 1 cigarette this morning, and is attempting smoking cessation with  nicotine  patch. He states today is his intended quit date. He reports occasional alcohol use, typically on weekends when not caring for his daughter, with last use this past Friday and Saturday. He describes alcohol consumption as a couple of drinks socially and denies current alcohol use outside of these occasions.  He is currently employed and works until around 3:20-3:30 PM. His daughter stays with him every other weekend and sometimes more often. He reports being busy with personal projects, denies any new relationships, and spends time with friends when able.   Visit Diagnosis:    ICD-10-CM   1. Schizophrenia in remission (HCC)  F20.9 divalproex  (DEPAKOTE  ER) 500 MG 24 hr tablet    2. GAD (generalized anxiety disorder)  F41.1 divalproex  (DEPAKOTE  ER) 500 MG 24 hr tablet    3. Alcohol use disorder, mild, in early remission  F10.11     4. Tobacco use disorder  F17.200    Moderate      Past Psychiatric History: I have reviewed past psychiatric history from progress note on 09/23/2018.  Past Medical History:  Past Medical History:  Diagnosis Date   Anxiety    Asthma    Bipolar disorder (HCC)    CIGARETTE SMOKER 02/08/2011   Qualifier: Diagnosis of  By: Vicci MD, [place]     Seizures [facility])     Past Surgical History:  Procedure Laterality Date   COLONOSCOPY WITH PROPOFOL  N/A 02/28/2024   Procedure: COLONOSCOPY WITH  PROPOFOL ;  Surgeon: Therisa Bi, MD;  Location: [facility] ENDOSCOPY;  Service: Gastroenterology;  Laterality: N/A;   WISDOM TOOTH EXTRACTION      Family Psychiatric History: I have reviewed family psychiatric history from progress note on 09/23/2018.  Family History:  Family History  Problem Relation Age of Onset   Anxiety disorder Mother    OCD Brother    Anxiety disorder Maternal Aunt     Anxiety disorder Maternal Grandmother    Anxiety disorder Paternal Grandfather     Social History: I have reviewed social history from progress note on 09/23/2018. Social History   Socioeconomic History   Marital status: Significant Other    Spouse name: Not on file   Number of children: 1   Years of education: Not on file   Highest education level: Associate degree: occupational, scientist, product/process development, or vocational program  Occupational History   Occupation: location manager     Comment: armed forces operational officer    Occupation: Shipping and receiving  Tobacco Use   Smoking status: Every Day    Current packs/day: 0.50    Average packs/day: 2.0 packs/day for 21.9 years (43.2 ttl pk-yrs)    Types: Cigarettes    Start date: 2004   Smokeless tobacco: Never  Vaping Use   Vaping status: Former  Substance and Sexual Activity   Alcohol use: Yes    Alcohol/week: 2.0 standard drinks of alcohol    Types: 2 Cans of beer per week    Comment: weekly.none last 24hrs   Drug use: Not Currently    Types: Marijuana, Other-see comments, LSD    Comment: in past   Sexual activity: Yes    Birth control/protection: None  Other Topics Concern   Not on file  Social History Narrative   Not on file   Social Drivers of Health   Financial Resource Strain: Low Risk  (02/01/2024)   Overall Financial Resource Strain (CARDIA)    Difficulty of Paying Living Expenses: Not very hard  Food Insecurity: No Food Insecurity (02/01/2024)   Hunger Vital Sign    Worried About Running Out of Food in the Last Year: Never true    Ran Out of Food in the Last Year: Never true  Transportation Needs: No Transportation Needs (02/01/2024)   PRAPARE - Administrator, Civil Service (Medical): No    Lack of Transportation (Non-Medical): No  Physical Activity: Sufficiently Active (02/01/2024)   Exercise Vital Sign    Days of Exercise per Week: 5 days    Minutes of Exercise per Session: 120 min  Stress: Stress Concern Present (02/01/2024)    [employer] of Occupational Health - Occupational Stress Questionnaire    Feeling of Stress : To some extent  Social Connections: Moderately Isolated (02/01/2024)   Social Connection and Isolation Panel    Frequency of Communication with Friends and Family: More than three times a week    Frequency of Social Gatherings with Friends and Family: Three times a week    Attends Religious Services: More than 4 times per year    Active Member of Clubs or Organizations: No    Attends Banker Meetings: Never    Marital Status: Never married    Allergies:  Allergies  Allergen Reactions   Hydroxyzine Hcl Rash    Metabolic Disorder Labs: Lab Results  Component Value Date   HGBA1C 4.9 07/25/2024   MPG 105.41 06/05/2023   MPG 105 03/04/2020   Lab Results  Component Value Date   PROLACTIN 11.4 06/05/2023  Lab Results  Component Value Date   CHOL 178 07/25/2024   TRIG 119 07/25/2024   HDL 79 07/25/2024   CHOLHDL 2.3 07/25/2024   VLDL 20 06/05/2023   LDLCALC 78 07/25/2024   LDLCALC 102 (H) 06/05/2023   Lab Results  Component Value Date   TSH 0.828 07/25/2024   TSH 0.850 06/05/2023    Therapeutic Level Labs: No results found for: LITHIUM Lab Results  Component Value Date   VALPROATE 36 (L) 07/09/2024   VALPROATE 64 06/05/2023   No results found for: CBMZ  Current Medications: Current Outpatient Medications  Medication Sig Dispense Refill   albuterol  (VENTOLIN  HFA) 108 (90 Base) MCG/ACT inhaler Inhale 2 puffs into the lungs every 6 (six) hours as needed for wheezing or shortness of breath. 8.5 each 3   cyclobenzaprine  (FLEXERIL ) 10 MG tablet Take 1 tablet (10 mg total) by mouth 2 (two) times daily as needed for muscle spasms. (Patient not taking: Reported on 07/17/2024) 20 tablet 0   dicyclomine  (BENTYL ) 10 MG capsule Take 1 capsule (10 mg total) by mouth 4 (four) times daily -  before meals and at bedtime. (Patient not taking: Reported on 07/17/2024) 30  capsule 5   divalproex  (DEPAKOTE  ER) 500 MG 24 hr tablet Take 1 tablet (500 mg total) by mouth daily. 90 tablet 0   Fluticasone -Umeclidin-Vilant (TRELEGY ELLIPTA ) 200-62.5-25 MCG/ACT AEPB Inhale 1 puff into the lungs daily. (Patient not taking: Reported on 07/17/2024) 60 each 11   gabapentin  (NEURONTIN ) 600 MG tablet TAKE 1 TABLET BY MOUTH THREE TIMES A DAY (Patient taking differently: Take 300 mg by mouth 2 (two) times daily.) 270 tablet 0   Na Sulfate-K Sulfate-Mg Sulfate concentrate (SUPREP) 17.5-3.13-1.6 GM/177ML SOLN See admin instructions. (Patient not taking: Reported on 07/17/2024)     nicotine  (NICODERM CQ  - DOSED IN MG/24 HOURS) 21 mg/24hr patch Place 1 patch (21 mg total) onto the skin daily. 28 patch 2   predniSONE  (DELTASONE ) 20 MG tablet Take 2 tablets (40 mg total) by mouth daily. (Patient not taking: Reported on 07/17/2024) 10 tablet 0   triamcinolone  (NASACORT ) 55 MCG/ACT AERO nasal inhaler PLACE 1 SPRAY INTO THE NOSE 2 (TWO) TIMES DAILY. 16.9 each 12   No current facility-administered medications for this visit.     Musculoskeletal: Strength & Muscle Tone: UTA Gait & Station: Seated Patient leans: N/A  Psychiatric Specialty Exam: Review of Systems  Psychiatric/Behavioral: Negative.      There were no vitals taken for this visit.There is no height or weight on file to calculate BMI.  General Appearance: Casual  Eye Contact:  Fair  Speech:  Normal Rate  Volume:  Normal  Mood:  Euthymic  Affect:  Congruent  Thought Process:  Goal Directed and Descriptions of Associations: Intact  Orientation:  Full (Time, Place, and Person)  Thought Content: Logical   Suicidal Thoughts:  No  Homicidal Thoughts:  No  Memory:  Immediate;   Fair Recent;   Fair Remote;   Fair  Judgement:  Fair  Insight:  Fair  Psychomotor Activity:  Normal  Concentration:  Concentration: Fair and Attention Span: Fair  Recall:  [employer] of Knowledge: Fair  Language: Fair  Akathisia:  No  Handed:   Right  AIMS (if indicated): not done  Assets:  Communication Skills Desire for Improvement Housing Social Support Talents/Skills Transportation  ADL's:  Intact  Cognition: WNL  Sleep:  Fair   Screenings: AIMS    Flowsheet Row Office Visit from 12/27/2023 in [place]  [facility] Office Visit from 10/10/2023 in [facility] Office Visit from 08/07/2023 in [facility] Office Visit from 02/07/2023 in [facility] Video Visit from 10/05/2022 in [facility]  AIMS Total Score 0 0 0 0 0   AUDIT    Flowsheet Row Video Visit from 08/01/2022 in [facility]  Alcohol Use Disorder Identification Test Final Score (AUDIT) 15   GAD-7    Flowsheet Row Office Visit from 10/22/2024 in [facility] Office Visit from 07/17/2024 in [facility] Counselor from 07/09/2024 in [facility] Counselor from 02/01/2024 in [facility] Office Visit from 12/27/2023 in [facility]  Total GAD-7 Score 0 0 6 2 8    PHQ2-9    Flowsheet Row Office Visit from 10/22/2024 in [facility] Office Visit from 07/17/2024 in [facility] Counselor from 02/01/2024 in [facility] Office Visit from 12/27/2023 in [facility] Office Visit from 10/10/2023 in [facility]  PHQ-2 Total Score 0 0 0 0 0  PHQ-9 Total Score -- -- 2 -- --   Flowsheet Row Office Visit from 10/22/2024 in [facility] Video Visit from  08/27/2024 in [facility] Video Visit from 06/26/2024 in [facility]  C-SSRS RISK CATEGORY No Risk No Risk No Risk     Assessment and Plan: Rodney [name] is a 38 year old Caucasian male who presented for a follow-up appointment, discussed assessment and plan as noted below.  1. Schizophrenia in remission [facility]) Currently denies any significant problems and reports no changes since stopping the risperidone . Continue Depakote  500 mg daily, reduced dosage.9 Depakote  level-07/09/2024-36, subtherapeutic)  2. GAD (generalized anxiety disorder)-stable Currently denies any significant anxiety Continue psychotherapy sessions with Ms. Almarie Ligas  3. Alcohol use disorder, mild, in early remission Continues to use alcohol on weekends. Will reevaluate in future sessions  4. Tobacco use disorder-improving Reports today cytosol quit date. Continue Nicotine  patch 21 mg.  Follow-up Follow-up in clinic in 3 months or sooner if needed.    Collaboration of Care: Collaboration of Care: Other encouraged to continue psychotherapy sessions  Patient/Guardian was advised Release of Information must be obtained prior to any record release in order to collaborate their care with an outside provider. Patient/Guardian was advised if they have not already done so to contact the registration department to sign all necessary forms in order for us  to release information regarding their care.   Consent: Patient/Guardian gives verbal consent for treatment and assignment of benefits for services provided during this visit. Patient/Guardian expressed understanding and agreed to proceed.   This note was generated in part or whole with voice recognition software. Voice recognition is usually quite accurate but there are transcription errors that can and very often do occur. I apologize for any typographical errors that were not detected and  corrected.    Rodney Mix, MD 11/10/2024, 4:21 PM

## 2024-11-17 ENCOUNTER — Ambulatory Visit (INDEPENDENT_AMBULATORY_CARE_PROVIDER_SITE_OTHER): Admitting: Professional Counselor

## 2024-11-17 DIAGNOSIS — F411 Generalized anxiety disorder: Secondary | ICD-10-CM | POA: Diagnosis not present

## 2024-11-17 DIAGNOSIS — F17200 Nicotine dependence, unspecified, uncomplicated: Secondary | ICD-10-CM

## 2024-11-17 NOTE — Progress Notes (Unsigned)
 THERAPIST PROGRESS NOTE  Virtual Visit via Video Note  I connected with Rodney [name] on 11/18/24 at  4:00 PM EST by a video enabled telemedicine application and verified that I am speaking with the correct person using two identifiers.  Location: Patient: Work Provider: Remote office   I discussed the limitations of evaluation and management by telemedicine and the availability of in person appointments. The patient expressed understanding and agreed to proceed.   I discussed the assessment and treatment plan with the patient. The patient was provided an opportunity to ask questions and all were answered. The patient agreed with the plan and demonstrated an understanding of the instructions.   The patient was advised to call back or seek an in-person evaluation if the symptoms worsen or if the condition fails to improve as anticipated.  I provided 32 minutes of non-face-to-face time during this encounter. Rodney [name], [facility]  Session Time: 4:03 PM - 4:35 PM   Participation Level: Active  Behavioral Response: Casual, Alert, Euthymic  Type of Therapy: Individual Therapy  Treatment Goals addressed: Active Anxiety  LTG: I guess just to be financially sound, be healthy.  (Progressing)                Start:  03/13/24    Expected End:  03/12/25    Goal Note Right now as far as that goes I'm trying to bring up my credit score and being able to use home equity with my mortgage. I need to consolidate pretty much credit cards and stuff like that.    STG: I get a bit anxious at times, certain situations. To reduce symptoms of anxiety AEB reduction in GAD7 scores by utilizing coping mechanisms and restructuring maladaptive patterns of thinking over the next 12 weeks. (Progressing)  Goal Note I'd say all and all pretty good.   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 Drinking and smoking is going well, gambling is not going well. To reduce addictive behaviors AEB  identifying and reducing triggers and implementing positive replacement behaviors over the next 12 weeks.  (Progressing)  Goal Note I feel like what I did accomplish was really good and it made me feel like I can do those things. Right now, I'm on a bit of a side road but I plan on jumping back in and moderate or eliminate things in the future. I scratch that itch (gambling) occasionally but it's not like it was, an every day thing.   STG: I guess thought blocking or brain fog. To improve mindfulness AEB decrease in brain fog by utilizing mindfulness skills 3 out of 7 days a week for the next 12 weeks.  (Progressing)  Goal Note That's just random like when it will happen but I'm leaning towards it's gotten a little bit better.   ProgressTowards Goals: Progressing  Interventions: Motivational Interviewing, Solution Focused, and Supportive  Summary: Rodney [name] is a 38 y.o. male who presents with a history of schizophrenia, in remission, anxiety, and tobacco use disorder. He appeared alert and oriented x5. He noted he's getting overtime at work today so he could only have a short session. Rodney [name] noted things are going well overall. He is still working on quitting smoking. He has patches and is using a vape at this time. He noted there have been some issues with co-parenting. They still need to schedule mediation, but he may be filing with his lawyer for contempt since his daughter's mother hasn't been following the previous  plan. Rodney [name] noted upcoming plans for the holidays and vacations. He has been weaning off medication and asked for increase in therapy sessions during this transition time.   Therapist Response: Conducted session with [employer]. Began session with check-in/update since previous session. Utilized empathetic and reflective listening. Used open-ended questions to facilitate discussion and summarized Rodney [name]'s thoughts/feelings. Focused on change talk and plan for making those changes. Scheduled  additional appointment and concluded session.   Suicidal/Homicidal: No  Plan: Return again in 2 weeks.  Diagnosis: GAD (generalized anxiety disorder)  Tobacco use disorder  Collaboration of Care: Medication Management AEB chart review  Patient/Guardian was advised Release of Information must be obtained prior to any record release in order to collaborate their care with an outside provider. Patient/Guardian was advised if they have not already done so to contact the registration department to sign all necessary forms in order for us  to release information regarding their care.   Consent: Patient/Guardian gives verbal consent for treatment and assignment of benefits for services provided during this visit. Patient/Guardian expressed understanding and agreed to proceed.   Rodney [name], [facility] 11/18/2024

## 2024-12-01 ENCOUNTER — Ambulatory Visit: Admitting: Professional Counselor

## 2024-12-01 DIAGNOSIS — F209 Schizophrenia, unspecified: Secondary | ICD-10-CM

## 2024-12-01 DIAGNOSIS — F17200 Nicotine dependence, unspecified, uncomplicated: Secondary | ICD-10-CM | POA: Diagnosis not present

## 2024-12-01 DIAGNOSIS — F411 Generalized anxiety disorder: Secondary | ICD-10-CM | POA: Diagnosis not present

## 2024-12-01 DIAGNOSIS — F2089 Other schizophrenia: Secondary | ICD-10-CM | POA: Diagnosis not present

## 2024-12-01 NOTE — Progress Notes (Signed)
 THERAPIST PROGRESS NOTE  Virtual Visit via Video Note  I connected with Rodney [name] on 12/01/24 at  4:00 PM EST by a video enabled telemedicine application and verified that I am speaking with the correct person using two identifiers.  Location: Patient: Work Provider: Remote office   I discussed the limitations of evaluation and management by telemedicine and the availability of in person appointments. The patient expressed understanding and agreed to proceed.   I discussed the assessment and treatment plan with the patient. The patient was provided an opportunity to ask questions and all were answered. The patient agreed with the plan and demonstrated an understanding of the instructions.   The patient was advised to call back or seek an in-person evaluation if the symptoms worsen or if the condition fails to improve as anticipated.  I provided 25 minutes of non-face-to-face time during this encounter. Rodney [name], [facility]  Session Time: 4:01 PM - 4:26 PM   Participation Level: Active  Behavioral Response: Casual, Alert, Anxious  Type of Therapy: Individual Therapy  Treatment Goals addressed: Active Anxiety  LTG: I guess just to be financially sound, be healthy.  (Progressing)                Start:  03/13/24    Expected End:  03/12/25    Goal Note Right now as far as that goes I'm trying to bring up my credit score and being able to use home equity with my mortgage. I need to consolidate pretty much credit cards and stuff like that.    STG: I get a bit anxious at times, certain situations. To reduce symptoms of anxiety AEB reduction in GAD7 scores by utilizing coping mechanisms and restructuring maladaptive patterns of thinking over the next 12 weeks. (Progressing)  Goal Note I'd say all and all pretty good.   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 Drinking and smoking is going well, gambling is not going well. To reduce addictive behaviors AEB  identifying and reducing triggers and implementing positive replacement behaviors over the next 12 weeks.  (Progressing)  Goal Note I feel like what I did accomplish was really good and it made me feel like I can do those things. Right now, I'm on a bit of a side road but I plan on jumping back in and moderate or eliminate things in the future. I scratch that itch (gambling) occasionally but it's not like it was, an every day thing.   STG: I guess thought blocking or brain fog. To improve mindfulness AEB decrease in brain fog by utilizing mindfulness skills 3 out of 7 days a week for the next 12 weeks.  (Progressing)  Goal Note That's just random like when it will happen but I'm leaning towards it's gotten a little bit better.   ProgressTowards Goals: Progressing  Interventions: Motivational Interviewing and Supportive  Summary: Rodney [name] is a 38 y.o. male who presents with a history of schizophrenia in remission, anxiety, and tobacco use disorder. He appeared alert and oriented x5. He reported he is doing overtime again at work today. Rodney [name] provided updates with co-parenting and court custody dealings. He noted potential upcoming plans for holidays. He is still working on smoking cessation. He engaged in discussion about coping skills to help manage urges. He agreed with a balance of acceptance and change as he continues to work on this goal.   Therapist Response: Conducted session with Rodney [name]. Began session with check-in/update since previous session. Utilized  empathetic and reflective listening. Used open-ended questions to facilitate discussion and summarized Rodney [name]'s thoughts/feelings. Focused on change talk around smoking cessation and discussed coping skills to manage triggers/urges. Scheduled additional appointment and concluded session.   Suicidal/Homicidal: No  Plan: Return again in 4 weeks.  Diagnosis: GAD (generalized anxiety disorder)  Schizophrenia in remission (HCC)  Tobacco  use disorder  Collaboration of Care: Medication Management AEB chart review  Patient/Guardian was advised Release of Information must be obtained prior to any record release in order to collaborate their care with an outside provider. Patient/Guardian was advised if they have not already done so to contact the registration department to sign all necessary forms in order for us  to release information regarding their care.   Consent: Patient/Guardian gives verbal consent for treatment and assignment of benefits for services provided during this visit. Patient/Guardian expressed understanding and agreed to proceed.   Rodney [name], [facility] 12/01/2024

## 2024-12-31 ENCOUNTER — Ambulatory Visit: Admitting: Professional Counselor

## 2024-12-31 DIAGNOSIS — F2089 Other schizophrenia: Secondary | ICD-10-CM | POA: Diagnosis not present

## 2024-12-31 DIAGNOSIS — Z720 Tobacco use: Secondary | ICD-10-CM | POA: Diagnosis not present

## 2024-12-31 DIAGNOSIS — F17200 Nicotine dependence, unspecified, uncomplicated: Secondary | ICD-10-CM

## 2024-12-31 DIAGNOSIS — F411 Generalized anxiety disorder: Secondary | ICD-10-CM

## 2024-12-31 DIAGNOSIS — F209 Schizophrenia, unspecified: Secondary | ICD-10-CM

## 2024-12-31 NOTE — Progress Notes (Signed)
 " THERAPIST PROGRESS NOTE  Virtual Visit via Video Note  I connected with Rodney [name] on 12/31/2024 at  4:00 PM EST by a video enabled telemedicine application and verified that I am speaking with the correct person using two identifiers.  Location: Patient: Home Provider: Remote office   I discussed the limitations of evaluation and management by telemedicine and the availability of in person appointments. The patient expressed understanding and agreed to proceed.   I discussed the assessment and treatment plan with the patient. The patient was provided an opportunity to ask questions and all were answered. The patient agreed with the plan and demonstrated an understanding of the instructions.   The patient was advised to call back or seek an in-person evaluation if the symptoms worsen or if the condition fails to improve as anticipated.  I provided 19 minutes of non-face-to-face time during this encounter. [name], [facility]  Session Time: 4:06 PM - 4:25 PM  Participation Level: Active  Behavioral Response: Casual, Alert, Euthymic  Type of Therapy: Individual Therapy  Treatment Goals addressed: Active Anxiety  LTG: I guess just to be financially sound, be healthy.  (Progressing)                Start:  03/13/24    Expected End:  03/12/25    Goal Note Right now as far as that goes I'm trying to bring up my credit score and being able to use home equity with my mortgage. I need to consolidate pretty much credit cards and stuff like that.    STG: I get a bit anxious at times, certain situations. To reduce symptoms of anxiety AEB reduction in GAD7 scores by utilizing coping mechanisms and restructuring maladaptive patterns of thinking over the next 12 weeks. (Progressing)  Goal Note I'd say all and all pretty good.   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 Drinking and smoking is going well, gambling is not going well. To reduce addictive behaviors AEB  identifying and reducing triggers and implementing positive replacement behaviors over the next 12 weeks.  (Progressing)  Goal Note I feel like what I did accomplish was really good and it made me feel like I can do those things. Right now, I'm on a bit of a side road but I plan on jumping back in and moderate or eliminate things in the future. I scratch that itch (gambling) occasionally but it's not like it was, an every day thing.   STG: I guess thought blocking or brain fog. To improve mindfulness AEB decrease in brain fog by utilizing mindfulness skills 3 out of 7 days a week for the next 12 weeks.  (Progressing)  Goal Note That's just random like when it will happen but I'm leaning towards it's gotten a little bit better.   ProgressTowards Goals: Progressing  Interventions: Motivational Interviewing and Supportive  Summary: DEMERE DOTZLER is a 39 y.o. male who presents with a history of schizophrenia currently in remission, anxiety, and tobacco use disorder. He appeared alert and oriented x5. He stated the holidays went well. There has been some issues with co-parenting relationship but it has been management. His daughter's mother still needs to complete a parenting class before they can go into mediation. She is supposed to do this soon. [employer] noted work is going well. He is still working on smoking cessation but hasn't gone backwards on this goal. He continues to feel well and still in remission from his schizophrenia.  Therapist Response: Conducted session with [employer]. Began session with check-in/update since previous session. Utilized empathetic and reflective listening. Used open-ended questions to facilitate discussion and summarized Jovaughn's thoughts/feelings. Continued to reinforce positive change talk and progress on goals. Scheduled additional appointment and concluded session.   Suicidal/Homicidal: No  Plan: Return again in 5 weeks.  Diagnosis: GAD (generalized anxiety  disorder)  Tobacco use disorder  Schizophrenia in remission [facility])  Collaboration of Care: Medication Management AEB chart review  Patient/Guardian was advised Release of Information must be obtained prior to any record release in order to collaborate their care with an outside provider. Patient/Guardian was advised if they have not already done so to contact the registration department to sign all necessary forms in order for us  to release information regarding their care.   Consent: Patient/Guardian gives verbal consent for treatment and assignment of benefits for services provided during this visit. Patient/Guardian expressed understanding and agreed to proceed.   [name], [facility] 12/31/2024  "

## 2025-02-04 ENCOUNTER — Ambulatory Visit: Admitting: Professional Counselor

## 2025-02-11 ENCOUNTER — Telehealth: Admitting: Psychiatry
